# Patient Record
Sex: Female | Born: 1955 | Race: White | Hispanic: No | Smoking: Never smoker
Health system: Southern US, Community
[De-identification: ages and names within clinical notes are randomized; demographics above are authoritative.]

## PROBLEM LIST (undated history)

## (undated) DIAGNOSIS — E119 Type 2 diabetes mellitus without complications: Secondary | ICD-10-CM

## (undated) DIAGNOSIS — Z9889 Other specified postprocedural states: Secondary | ICD-10-CM

## (undated) DIAGNOSIS — F419 Anxiety disorder, unspecified: Secondary | ICD-10-CM

## (undated) DIAGNOSIS — G2581 Restless legs syndrome: Secondary | ICD-10-CM

## (undated) DIAGNOSIS — G8929 Other chronic pain: Secondary | ICD-10-CM

## (undated) DIAGNOSIS — Z87898 Personal history of other specified conditions: Secondary | ICD-10-CM

## (undated) DIAGNOSIS — R600 Localized edema: Secondary | ICD-10-CM

## (undated) DIAGNOSIS — F329 Major depressive disorder, single episode, unspecified: Secondary | ICD-10-CM

## (undated) DIAGNOSIS — E785 Hyperlipidemia, unspecified: Secondary | ICD-10-CM

## (undated) DIAGNOSIS — A4902 Methicillin resistant Staphylococcus aureus infection, unspecified site: Secondary | ICD-10-CM

## (undated) DIAGNOSIS — R112 Nausea with vomiting, unspecified: Secondary | ICD-10-CM

## (undated) DIAGNOSIS — Z973 Presence of spectacles and contact lenses: Secondary | ICD-10-CM

## (undated) DIAGNOSIS — G4730 Sleep apnea, unspecified: Secondary | ICD-10-CM

## (undated) DIAGNOSIS — I10 Essential (primary) hypertension: Secondary | ICD-10-CM

## (undated) DIAGNOSIS — G5751 Tarsal tunnel syndrome, right lower limb: Secondary | ICD-10-CM

## (undated) DIAGNOSIS — I4510 Unspecified right bundle-branch block: Secondary | ICD-10-CM

## (undated) DIAGNOSIS — E559 Vitamin D deficiency, unspecified: Secondary | ICD-10-CM

## (undated) DIAGNOSIS — E669 Obesity, unspecified: Secondary | ICD-10-CM

## (undated) DIAGNOSIS — R609 Edema, unspecified: Secondary | ICD-10-CM

## (undated) DIAGNOSIS — M722 Plantar fascial fibromatosis: Secondary | ICD-10-CM

## (undated) DIAGNOSIS — I639 Cerebral infarction, unspecified: Secondary | ICD-10-CM

## (undated) DIAGNOSIS — R0683 Snoring: Secondary | ICD-10-CM

## (undated) DIAGNOSIS — Z7901 Long term (current) use of anticoagulants: Secondary | ICD-10-CM

## (undated) DIAGNOSIS — G629 Polyneuropathy, unspecified: Secondary | ICD-10-CM

## (undated) DIAGNOSIS — E7800 Pure hypercholesterolemia, unspecified: Secondary | ICD-10-CM

## (undated) DIAGNOSIS — F32A Depression, unspecified: Secondary | ICD-10-CM

## (undated) DIAGNOSIS — R011 Cardiac murmur, unspecified: Secondary | ICD-10-CM

## (undated) DIAGNOSIS — G43909 Migraine, unspecified, not intractable, without status migrainosus: Secondary | ICD-10-CM

## (undated) HISTORY — DX: Essential (primary) hypertension: I10

## (undated) HISTORY — PX: KNEE SURGERY: SHX244

## (undated) HISTORY — DX: Cerebral infarction, unspecified: I63.9

## (undated) HISTORY — DX: Anxiety disorder, unspecified: F41.9

## (undated) HISTORY — PX: HX EYE SURGERY: 2100001143

## (undated) HISTORY — DX: Depression, unspecified: F32.A

## (undated) HISTORY — PX: HX APPENDECTOMY: SHX54

## (undated) HISTORY — DX: Methicillin resistant Staphylococcus aureus infection, unspecified site: A49.02

## (undated) HISTORY — DX: Pure hypercholesterolemia, unspecified: E78.00

## (undated) HISTORY — DX: Major depressive disorder, single episode, unspecified: F32.9

## (undated) HISTORY — DX: Migraine, unspecified, not intractable, without status migrainosus: G43.909

## (undated) HISTORY — DX: Hyperlipidemia, unspecified: E78.5

## (undated) HISTORY — PX: CYSTECTOMY: SUR359

## (undated) HISTORY — PX: REPLACEMENT TOTAL KNEE: SUR1224

---
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
## 1990-12-13 HISTORY — PX: HX TUBAL LIGATION: SHX77

## 2014-01-13 HISTORY — PX: APPENDECTOMY: SHX54

## 2014-11-13 DIAGNOSIS — Z23 Encounter for immunization: Secondary | ICD-10-CM | POA: Diagnosis not present

## 2014-11-13 DIAGNOSIS — D171 Benign lipomatous neoplasm of skin and subcutaneous tissue of trunk: Secondary | ICD-10-CM | POA: Diagnosis not present

## 2014-11-13 DIAGNOSIS — M940 Chondrocostal junction syndrome [Tietze]: Secondary | ICD-10-CM | POA: Diagnosis not present

## 2014-12-03 DIAGNOSIS — D1730 Benign lipomatous neoplasm of skin and subcutaneous tissue of unspecified sites: Secondary | ICD-10-CM | POA: Diagnosis not present

## 2014-12-23 DIAGNOSIS — D1730 Benign lipomatous neoplasm of skin and subcutaneous tissue of unspecified sites: Secondary | ICD-10-CM | POA: Diagnosis not present

## 2014-12-24 DIAGNOSIS — R9431 Abnormal electrocardiogram [ECG] [EKG]: Secondary | ICD-10-CM | POA: Diagnosis not present

## 2014-12-24 DIAGNOSIS — I4519 Other right bundle-branch block: Secondary | ICD-10-CM | POA: Diagnosis not present

## 2014-12-30 DIAGNOSIS — D1730 Benign lipomatous neoplasm of skin and subcutaneous tissue of unspecified sites: Secondary | ICD-10-CM | POA: Diagnosis not present

## 2015-05-29 DIAGNOSIS — I6930 Unspecified sequelae of cerebral infarction: Secondary | ICD-10-CM | POA: Diagnosis not present

## 2015-05-29 DIAGNOSIS — I10 Essential (primary) hypertension: Secondary | ICD-10-CM | POA: Diagnosis not present

## 2015-06-13 LAB — HM MAMMOGRAPHY: HM MAMMO: NORMAL

## 2015-06-13 LAB — HM PAP SMEAR: HM Pap smear: NORMAL

## 2015-10-09 DIAGNOSIS — Z6838 Body mass index (BMI) 38.0-38.9, adult: Secondary | ICD-10-CM | POA: Diagnosis not present

## 2015-10-09 DIAGNOSIS — G2581 Restless legs syndrome: Secondary | ICD-10-CM | POA: Diagnosis not present

## 2015-10-09 DIAGNOSIS — G5792 Unspecified mononeuropathy of left lower limb: Secondary | ICD-10-CM | POA: Diagnosis not present

## 2015-10-09 DIAGNOSIS — I10 Essential (primary) hypertension: Secondary | ICD-10-CM | POA: Diagnosis not present

## 2015-12-16 ENCOUNTER — Ambulatory Visit (INDEPENDENT_AMBULATORY_CARE_PROVIDER_SITE_OTHER): Payer: Medicare Other | Admitting: Family Medicine

## 2015-12-16 ENCOUNTER — Encounter: Payer: Self-pay | Admitting: Family Medicine

## 2015-12-16 VITALS — BP 124/84 | HR 84 | Temp 97.8°F | Resp 16 | Ht 62.5 in | Wt 215.0 lb

## 2015-12-16 DIAGNOSIS — G43009 Migraine without aura, not intractable, without status migrainosus: Secondary | ICD-10-CM | POA: Diagnosis not present

## 2015-12-16 DIAGNOSIS — I6930 Unspecified sequelae of cerebral infarction: Secondary | ICD-10-CM

## 2015-12-16 DIAGNOSIS — I10 Essential (primary) hypertension: Secondary | ICD-10-CM

## 2015-12-16 DIAGNOSIS — F329 Major depressive disorder, single episode, unspecified: Secondary | ICD-10-CM | POA: Diagnosis not present

## 2015-12-16 DIAGNOSIS — M25511 Pain in right shoulder: Secondary | ICD-10-CM | POA: Diagnosis not present

## 2015-12-16 DIAGNOSIS — E785 Hyperlipidemia, unspecified: Secondary | ICD-10-CM | POA: Diagnosis not present

## 2015-12-16 DIAGNOSIS — E559 Vitamin D deficiency, unspecified: Secondary | ICD-10-CM

## 2015-12-16 DIAGNOSIS — F32A Depression, unspecified: Secondary | ICD-10-CM

## 2015-12-16 DIAGNOSIS — Z1211 Encounter for screening for malignant neoplasm of colon: Secondary | ICD-10-CM | POA: Diagnosis not present

## 2015-12-16 MED ORDER — PREGABALIN 50 MG PO CAPS: 50.0000 mg | ORAL_CAPSULE | Freq: Two times a day (BID) | ORAL | Status: DC

## 2015-12-16 MED ORDER — SERTRALINE HCL 50 MG PO TABS: 50.0000 mg | ORAL_TABLET | Freq: Every day | ORAL | Status: DC

## 2015-12-16 MED ORDER — NORTRIPTYLINE HCL 25 MG PO CAPS: 25.0000 mg | ORAL_CAPSULE | Freq: Every day | ORAL | Status: DC

## 2015-12-16 MED ORDER — NORTRIPTYLINE HCL 10 MG PO CAPS: 10.0000 mg | ORAL_CAPSULE | Freq: Every day | ORAL | Status: DC

## 2015-12-16 NOTE — Patient Instructions (Addendum)
Depresssion: Let's switch to different medication to see if that helps your symptoms.  We are going to cross taper you onto the new medication over a period of two weeks: Nortriptyline: Take 25mg  once daily at bedtime for 7 days. Fill the 10 mg prescription and take 10 mg once daily at bedtime for 7 days. STOP around Jan 17. Zoloft/sertraline: Take 1/2 pill of 50 mg for a total of 25mg  for 1 week starting next week on January 10.  Then resume a full pill on January 17.   We will follow-up in about 3 weeks to check in on your HA and start a new medication for your HA.   Leg cramps: Take 250-400mg  of magnesium nightly for cramps.

## 2015-12-16 NOTE — Assessment & Plan Note (Signed)
Wean off TCA as it is not helping her symptoms. Cross taper TCA for SSRI over 2 week period.  Recheck efficacy of Zoloft in 4 weeks.  Check Vitamin D and TSH to see if they are exacerbating symptoms.

## 2015-12-16 NOTE — Assessment & Plan Note (Signed)
Controlled in office today. On ACE for renal protection. CMET today.

## 2015-12-16 NOTE — Assessment & Plan Note (Signed)
Continue Lyrica for residual pain. Review records from previous MD.  Aspirin and statin for secondary prevention.

## 2015-12-16 NOTE — Assessment & Plan Note (Signed)
Continue simvastatin.  Check lipid panel. 

## 2015-12-16 NOTE — Progress Notes (Signed)
Subjective:    Patient ID: Sheila Abbott, female    DOB: 1956/07/18, 60 y.o.   MRN: [id number]  HPI: Sheila Abbott is a 60 y.o. female presenting on 12/16/2015 for Establish Care   HPI  Pt presents to establish care today. Previous care provider was [facility] in [place].  It has been 2 months since Her last PCP visit. Records from previous provider will be requested and reviewed. Current medical problems include:  CVA/TIAs: Last TIA feb 2013. Takes daily aspirin and simvastatin for secondary prevention. Migraines: Taking nortriptyline to prevent HA. HA are frontal-throbbing pain pain. No visual changes. Some nausea. HA keeps her awake at night. Taking nortriptyline with little effect.  R shoulder pain: Taking lyrica for the pain, it helps. She cannot raise it up above elbow level.  Her previous PCP thought it was bursitis. No imaging, has never seen an ortho. Worked at [employer]- lots of lifting and repetitive motions. Never tried ibuprofen or aleve.  Hypertension: Taking lisinopril HCTZ. Diagnosed in 2011. Denies CP at home. No SOB. No visual changes. Daily HA- see above.  Cholesterol_ Taking simvastatin for secondary prevention. Depression: Taking nortriptyline for depression, however she is feeling down, depressed and hopeless. Especially  During holidays, sleep disturbances. No SI.  Health maintenance:  Last pap: 2016- normal. No abnormal pap. Mammogram- normal.  Colonoscopy: Never had.  TDAP- 6 years ago.  Needs flu today.    Past Medical History  Diagnosis Date  . Depression   . Hypertension   . Hyperlipidemia   . Migraine   . 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 Marital Status: Significant Other    Spouse Name: N/A  . Number of Children: N/A  . Years of Education: N/A   Occupational History  . Not on file.   Social History Main Topics  . Smoking status: Never Smoker   . Smokeless tobacco: Not on file    . Alcohol Use: No  . Drug Use: No  . Sexual Activity: Not on file   Other Topics Concern  . Not on file   Social History Narrative  . No narrative on file   Family History  Problem Relation Age of Onset  . Heart disease Mother   . Stroke Mother   . Diabetes Father   . Heart disease Father   . Stroke Father   . Diabetes Maternal Grandmother   . Heart disease Maternal Grandmother   . Stroke Maternal Grandmother   . Diabetes Maternal Grandfather   . Heart disease Maternal Grandfather   . Stroke Maternal Grandfather   . Diabetes Paternal Grandmother   . Heart disease Paternal Grandmother   . Stroke Paternal Grandmother   . Diabetes Paternal Grandfather   . Heart disease Paternal Grandfather   . Stroke Paternal Grandfather    No current outpatient prescriptions on file prior to visit.   No current facility-administered medications on file prior to visit.    Review of Systems  Constitutional: Negative for fever and chills.  HENT: Negative.   Respiratory: Negative for cough, chest tightness and wheezing.   Cardiovascular: Negative for chest pain and leg swelling.  Gastrointestinal: Negative for nausea, vomiting, abdominal pain, diarrhea and constipation.  Endocrine: Negative.  Negative for cold intolerance, heat intolerance, polydipsia, polyphagia and polyuria.  Genitourinary: Negative for dysuria and difficulty urinating.  Musculoskeletal: Positive for myalgias (muscle cramps in her legs) and arthralgias.  Neurological:  Negative for dizziness, light-headedness and numbness.  Psychiatric/Behavioral: Positive for sleep disturbance, dysphoric mood and decreased concentration. Negative for suicidal ideas.   Per HPI unless specifically indicated above     Objective:    BP 124/84 mmHg  Pulse 84  Temp(Src) 97.8 F (36.6 C) (Oral)  Resp 16  Ht 5' 2.5" (1.588 m)  Wt 215 lb (97.523 kg)  BMI 38.67 kg/m2  LMP 12/13/2010  Wt Readings from Last 3 Encounters:  12/16/15 215  lb (97.523 kg)    Physical Exam  Constitutional: She is oriented to person, place, and time. She appears well-developed and well-nourished.  HENT:  Head: Normocephalic and atraumatic.  Neck: Neck supple.  Cardiovascular: Normal rate, regular rhythm and normal heart sounds.  Exam reveals no gallop and no friction rub.   No murmur heard. Pulmonary/Chest: Effort normal and breath sounds normal. She has no wheezes. She exhibits no tenderness.  Abdominal: Soft. Normal appearance and bowel sounds are normal. She exhibits no distension and no mass. There is no tenderness. There is no rebound and no guarding.  Musculoskeletal: She exhibits no edema.       Right shoulder: She exhibits decreased range of motion (decrease abduction and adduction. ) and tenderness (AC joint.). She exhibits no bony tenderness and no swelling.  Lymphadenopathy:    She has no cervical adenopathy.  Neurological: She is alert and oriented to person, place, and time. She has normal strength and normal reflexes. No cranial nerve deficit. She displays a negative Romberg sign.  Skin: Skin is warm and dry.  Psychiatric: She has a normal mood and affect. Her speech is normal and behavior is normal. Judgment and thought content normal. Cognition and memory are normal.   Results for orders placed or performed in visit on 12/16/15  HM MAMMOGRAPHY  Result Value Ref Range   HM Mammogram normal   HM PAP SMEAR  Result Value Ref Range   HM Pap smear normal       Assessment & Plan:   Problem List Items Addressed This Visit      Cardiovascular and Mediastinum   Hypertension    Controlled in office today. On ACE for renal protection. CMET today.       Relevant Medications   aspirin 325 MG EC tablet   amLODipine (NORVASC) 5 MG tablet   simvastatin (ZOCOR) 20 MG tablet   lisinopril-hydrochlorothiazide (PRINZIDE,ZESTORETIC) 20-12.5 MG tablet   Other Relevant Orders   Comprehensive Metabolic Panel (CMET)   Migraine headache  without aura    Nortriptyline does not appear to be helping HA or depression. Wean off. When fully on new medication- try betablocker or CCB for migraine prevention.  Sparing use of NSAIDs for migraine relief.  RTC 4 weeks.       Relevant Medications   aspirin 325 MG EC tablet   amLODipine (NORVASC) 5 MG tablet   simvastatin (ZOCOR) 20 MG tablet   lisinopril-hydrochlorothiazide (PRINZIDE,ZESTORETIC) 20-12.5 MG tablet   nortriptyline (PAMELOR) 25 MG capsule   nortriptyline (PAMELOR) 10 MG capsule   sertraline (ZOLOFT) 50 MG tablet   pregabalin (LYRICA) 50 MG capsule     Other   Personal history of stroke with residual effects - Primary    Continue Lyrica for residual pain. Review records from previous MD.  Aspirin and statin for secondary prevention.       Relevant Medications   pregabalin (LYRICA) 50 MG capsule   Other Relevant Orders   Lipid Profile   Hyperlipidemia  Continue simvastatin. Check lipid panel.       Relevant Medications   aspirin 325 MG EC tablet   amLODipine (NORVASC) 5 MG tablet   simvastatin (ZOCOR) 20 MG tablet   lisinopril-hydrochlorothiazide (PRINZIDE,ZESTORETIC) 20-12.5 MG tablet   Other Relevant Orders   Lipid Profile   Depression    Wean off TCA as it is not helping her symptoms. Cross taper TCA for SSRI over 2 week period.  Recheck efficacy of Zoloft in 4 weeks.  Check Vitamin D and TSH to see if they are exacerbating symptoms.       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 Pt has history of repeitive movements. Previous provider never had her seen by orthopedist. Refer to ortho for evaluation and imaging. She is taking lyrica for pain. Consider tapering off if shoulder pain can be resolved. Sparing use of NSAIDs for pain given her CVA history.         Relevant Orders   Ambulatory referral to Orthopedic Surgery    Other Visit Diagnoses    Vitamin D deficiency        Relevant Orders    VITAMIN D 25 Hydroxy (Vit-D Deficiency, Fractures)    Screening for colon cancer        Relevant Orders    Cologuard       Meds ordered this encounter  Medications  . aspirin 325 MG EC tablet    Sig: Take 325 mg by mouth daily.  Marland Kitchen DISCONTD: pregabalin (LYRICA) 50 MG capsule    Sig: Take 50 mg by mouth 2 (two) times daily.  Marland Kitchen amLODipine (NORVASC) 5 MG tablet    Sig: Take 5 mg by mouth at bedtime.  . simvastatin (ZOCOR) 20 MG tablet    Sig: Take 20 mg by mouth at bedtime.  Marland Kitchen lisinopril-hydrochlorothiazide (PRINZIDE,ZESTORETIC) 20-12.5 MG tablet    Sig: Take 2 tablets by mouth daily.  Marland Kitchen DISCONTD: nortriptyline (PAMELOR) 50 MG capsule    Sig: Take 50 mg by mouth at bedtime.  . nortriptyline (PAMELOR) 25 MG capsule    Sig: Take 1 capsule (25 mg total) by mouth at bedtime.    Dispense:  7 capsule    Refill:  0    Order Specific Question:  Supervising Provider    Answer:  Arlis Porta [id number]  . nortriptyline (PAMELOR) 10 MG capsule    Sig: Take 1 capsule (10 mg total) by mouth at bedtime.    Dispense:  7 capsule    Refill:  0    Order Specific Question:  Supervising Provider    Answer:  Arlis Porta [id number]  . sertraline (ZOLOFT) 50 MG tablet    Sig: Take 1 tablet (50 mg total) by mouth daily.    Dispense:  30 tablet    Refill:  3    Order Specific Question:  Supervising Provider    Answer:  Arlis Porta [phone]  . pregabalin (LYRICA) 50 MG capsule    Sig: Take 1 capsule (50 mg total) by mouth 2 (two) times daily.    Dispense:  30 capsule    Refill:  1    Order Specific Question:  Supervising Provider    Answer:  Arlis Porta [phone]  Follow up plan: Return in about 4 weeks (around 01/13/2016) for HA and depression. Marland Kitchen

## 2015-12-16 NOTE — Assessment & Plan Note (Signed)
Nortriptyline does not appear to be helping HA or depression. Wean off. When fully on new medication- try betablocker or CCB for migraine prevention.  Sparing use of NSAIDs for migraine relief.  RTC 4 weeks.

## 2015-12-16 NOTE — Assessment & Plan Note (Signed)
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 Pt has history of repeitive movements. Previous provider never had her seen by orthopedist. Refer to ortho for evaluation and imaging. She is taking lyrica for pain. Consider tapering off if shoulder pain can be resolved. Sparing use of NSAIDs for pain given her CVA history.

## 2015-12-18 DIAGNOSIS — E785 Hyperlipidemia, unspecified: Secondary | ICD-10-CM | POA: Diagnosis not present

## 2015-12-18 DIAGNOSIS — I10 Essential (primary) hypertension: Secondary | ICD-10-CM | POA: Diagnosis not present

## 2015-12-18 DIAGNOSIS — I6930 Unspecified sequelae of cerebral infarction: Secondary | ICD-10-CM | POA: Diagnosis not present

## 2015-12-18 DIAGNOSIS — F329 Major depressive disorder, single episode, unspecified: Secondary | ICD-10-CM | POA: Diagnosis not present

## 2015-12-18 DIAGNOSIS — E559 Vitamin D deficiency, unspecified: Secondary | ICD-10-CM | POA: Diagnosis not present

## 2015-12-19 ENCOUNTER — Other Ambulatory Visit: Payer: Self-pay | Admitting: Family Medicine

## 2015-12-19 DIAGNOSIS — E559 Vitamin D deficiency, unspecified: Secondary | ICD-10-CM

## 2015-12-19 LAB — COMPREHENSIVE METABOLIC PANEL
A/G RATIO: 1.6 (ref 1.1–2.5)
ALBUMIN: 4.4 g/dL (ref 3.6–4.8)
ALK PHOS: 98 IU/L (ref 39–117)
ALT: 18 IU/L (ref 0–32)
AST: 16 IU/L (ref 0–40)
BUN / CREAT RATIO: 21 (ref 11–26)
BUN: 17 mg/dL (ref 8–27)
Bilirubin Total: 0.3 mg/dL (ref 0.0–1.2)
CO2: 22 mmol/L (ref 18–29)
CREATININE: 0.82 mg/dL (ref 0.57–1.00)
Calcium: 9.6 mg/dL (ref 8.7–10.3)
Chloride: 100 mmol/L (ref 96–106)
GFR calc Af Amer: 90 mL/min/{1.73_m2} (ref >59)
GFR calc non Af Amer: 78 mL/min/{1.73_m2} (ref >59)
GLOBULIN, TOTAL: 2.7 g/dL (ref 1.5–4.5)
Glucose: 104 mg/dL — ABNORMAL HIGH (ref 65–99)
Potassium: 4.1 mmol/L (ref 3.5–5.2)
Sodium: 139 mmol/L (ref 134–144)
Total Protein: 7.1 g/dL (ref 6.0–8.5)

## 2015-12-19 LAB — LIPID PANEL
CHOLESTEROL TOTAL: 155 mg/dL (ref 100–199)
Chol/HDL Ratio: 2.2 ratio units (ref 0.0–4.4)
HDL: 69 mg/dL (ref >39)
LDL CALC: 68 mg/dL (ref 0–99)
TRIGLYCERIDES: 89 mg/dL (ref 0–149)
VLDL CHOLESTEROL CAL: 18 mg/dL (ref 5–40)

## 2015-12-19 LAB — TSH: TSH: 2.43 u[IU]/mL (ref 0.450–4.500)

## 2015-12-19 LAB — VITAMIN D 25 HYDROXY (VIT D DEFICIENCY, FRACTURES): VIT D 25 HYDROXY: 16.3 ng/mL — AB (ref 30.0–100.0)

## 2015-12-19 MED ORDER — ERGOCALCIFEROL 1.25 MG (50000 UT) PO CAPS: 50000.0000 [IU] | ORAL_CAPSULE | ORAL | Status: AC

## 2015-12-25 DIAGNOSIS — Z1211 Encounter for screening for malignant neoplasm of colon: Secondary | ICD-10-CM | POA: Diagnosis not present

## 2015-12-25 DIAGNOSIS — Z1212 Encounter for screening for malignant neoplasm of rectum: Secondary | ICD-10-CM | POA: Diagnosis not present

## 2016-01-07 LAB — COLOGUARD: COLOGUARD: NEGATIVE

## 2016-01-08 ENCOUNTER — Encounter: Payer: Self-pay | Admitting: Family Medicine

## 2016-01-08 ENCOUNTER — Telehealth: Payer: Self-pay | Admitting: Family Medicine

## 2016-01-08 NOTE — Telephone Encounter (Signed)
Called pt to let her know Cologuard was negative. She can screen again in 3 years.

## 2016-01-13 ENCOUNTER — Ambulatory Visit (INDEPENDENT_AMBULATORY_CARE_PROVIDER_SITE_OTHER): Payer: Medicare Other | Admitting: Family Medicine

## 2016-01-13 VITALS — BP 104/68 | HR 71 | Temp 97.9°F | Resp 16 | Ht 62.5 in | Wt 217.0 lb

## 2016-01-13 DIAGNOSIS — R7301 Impaired fasting glucose: Secondary | ICD-10-CM

## 2016-01-13 DIAGNOSIS — F329 Major depressive disorder, single episode, unspecified: Secondary | ICD-10-CM | POA: Diagnosis not present

## 2016-01-13 DIAGNOSIS — I6930 Unspecified sequelae of cerebral infarction: Secondary | ICD-10-CM | POA: Diagnosis not present

## 2016-01-13 DIAGNOSIS — I10 Essential (primary) hypertension: Secondary | ICD-10-CM

## 2016-01-13 DIAGNOSIS — F32A Depression, unspecified: Secondary | ICD-10-CM

## 2016-01-13 LAB — POCT GLYCOSYLATED HEMOGLOBIN (HGB A1C): Hemoglobin A1C: 5

## 2016-01-13 MED ORDER — SERTRALINE HCL 100 MG PO TABS: 100.0000 mg | ORAL_TABLET | Freq: Every day | ORAL | Status: DC

## 2016-01-13 MED ORDER — GABAPENTIN 300 MG PO CAPS: ORAL_CAPSULE | ORAL | Status: DC

## 2016-01-13 NOTE — Assessment & Plan Note (Signed)
Trial of gabapentin for nerve pain. 300mg  up to TID.  Recheck 4 weeks for pain.  Consider muscle relaxant at night to help with pain and muscle cramping.   Continue aspirin and atorvastatin for secondary prevention.

## 2016-01-13 NOTE — Patient Instructions (Signed)
Leg pain: Let's try gabapentin to help with your symptoms Week 1: Take 1 capsule at bedtime. Week 2: Take 1 capsule in the AM and 1 at bedtime. Week 3: 1 capsule AM, 1 in the afternoon, 1 at bedtime OR 1 AM and 2 bedtime.    We will increase your zoloft to 100mg  daily. Take 2 tablets of your current bottle and then pick up new prescription.  BP: Take only 1 Lisinopril-HCTZ tablet daily. Goal BP >100/60 or <140/90

## 2016-01-13 NOTE — Progress Notes (Signed)
Subjective:    Patient ID: Sheila Abbott, female    DOB: 12-30-1955, 60 y.o.   MRN: [id number]  HPI: Sheila Abbott is a 60 y.o. female presenting on 01/13/2016 for Depression   HPI  Pt presents for depression follow-up.  Changed from nortriptyline to Zoloft at last visit. Overall feels some improvement with sertraline but is not sleeping well. Sleep- wakes up every few hours. Lays there trying to fall back to sleep. Pain in legs keeps her awake- "feels like a toothache" in her legs. Thought to be residual from stroke. Pain is constant throughout the day. 5-6/10. But feels better moving around. No creepy crawly sensation. Stopped lyrica because it doesn't help.   BP has been low in the AM- feeling dizzy with position changes. Checking at home. Getting similar numbers to reading today. Taking 2 lisinopril HCTZ combo pills and amlodipine each AM.   Past Medical History  Diagnosis Date  . Depression   . Hypertension   . Hyperlipidemia   . Migraine   . 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 amLODipine (NORVASC) 5 MG tablet Take 5 mg by mouth at bedtime.  Marland Kitchen aspirin 325 MG EC tablet Take 325 mg by mouth daily.  . ergocalciferol (VITAMIN D2) 50000 units capsule Take 1 capsule (50,000 Units total) by mouth once a week.  Marland Kitchen lisinopril-hydrochlorothiazide (PRINZIDE,ZESTORETIC) 20-12.5 MG tablet Take 1 tablet by mouth daily.  . simvastatin (ZOCOR) 20 MG tablet Take 20 mg by mouth at bedtime.   No current facility-administered medications on file prior to visit.    Review of Systems  Constitutional: Negative for fever and chills.  HENT: Negative.   Respiratory: Negative for cough, chest tightness and wheezing.   Cardiovascular: Negative for chest pain and leg swelling.  Gastrointestinal: Negative for nausea, vomiting, abdominal pain, diarrhea and constipation.  Endocrine: Negative.  Negative for cold intolerance, heat  intolerance, polydipsia, polyphagia and polyuria.  Genitourinary: Negative for dysuria and difficulty urinating.  Musculoskeletal: Positive for myalgias (bilateral legs R>L  burning pain at night.).  Neurological: Negative for dizziness, light-headedness and numbness.  Psychiatric/Behavioral: Positive for dysphoric mood.   Per HPI unless specifically indicated above     Objective:    BP 104/68 mmHg  Pulse 71  Temp(Src) 97.9 F (36.6 C) (Oral)  Resp 16  Ht 5' 2.5" (1.588 m)  Wt 217 lb (98.431 kg)  BMI 39.03 kg/m2  LMP 12/13/2010  Wt Readings from Last 3 Encounters:  01/13/16 217 lb (98.431 kg)  12/16/15 215 lb (97.523 kg)    Physical Exam  Constitutional: She is oriented to person, place, and time. She appears well-developed and well-nourished.  HENT:  Head: Normocephalic and atraumatic.  Neck: Neck supple.  Cardiovascular: Normal rate, regular rhythm and normal heart sounds.  Exam reveals no gallop and no friction rub.   No murmur heard. Pulmonary/Chest: Effort normal and breath sounds normal. She has no wheezes. She exhibits no tenderness.  Abdominal: Soft. Normal appearance and bowel sounds are normal. She exhibits no distension and no mass. There is no tenderness. There is no rebound and no guarding.  Musculoskeletal: Normal range of motion. She exhibits no edema or tenderness.  Lymphadenopathy:    She has no cervical adenopathy.  Neurological: She is alert and oriented to person, place, and time.  Skin: Skin is warm and dry.  Psychiatric: She has a normal mood and affect.  Her speech is normal and behavior is normal. Judgment and thought content normal. Cognition and memory are normal.     Depression screen [facility] 2/9 01/13/2016 12/16/2015  Decreased Interest 1 1  Down, Depressed, Hopeless 2 1  PHQ - 2 Score 3 2  Altered sleeping 3 2  Tired, decreased energy 3 2  Change in appetite 0 0  Feeling bad or failure about yourself  0 1  Trouble concentrating 0 1  Moving slowly  or fidgety/restless 0 0  Suicidal thoughts 0 0  PHQ-9 Score 9 8  Difficult doing work/chores Not difficult at all Somewhat difficult    Results for orders placed or performed in visit on 01/08/16  Cologuard  Result Value Ref Range   Cologuard Negative       Assessment & Plan:   Problem List Items Addressed This Visit      Cardiovascular and Mediastinum   Hypertension     Other   Personal history of stroke with residual effects    Trial of gabapentin for nerve pain. 300mg  up to TID.  Recheck 4 weeks for pain.  Consider muscle relaxant at night to help with pain and muscle cramping.   Continue aspirin and atorvastatin for secondary prevention.       Relevant Medications   gabapentin (NEURONTIN) 300 MG capsule   Depression - Primary    Doing well on Zoloft. Would like to increase to 100mg  to see if it improves her symptoms. Major issue is leg pain residual from stroke.  Increase dose. Recheck 4 weeks.       Relevant Medications   sertraline (ZOLOFT) 100 MG tablet    Other Visit Diagnoses    Elevated fasting glucose        Check HgA1c- WNL.     Relevant Orders    POCT HgB A1C       Meds ordered this encounter  Medications  . gabapentin (NEURONTIN) 300 MG capsule    Sig: Take 1 capsule at bedtime for one week. Increase to 1 capsule in the AM and 1 bedtime for 1 week. Increase to 1 AM, 1 PM, 1 bedtime daily.    Dispense:  90 capsule    Refill:  3    Order Specific Question:  Supervising Provider    Answer:  Arlis Porta [phone]  . sertraline (ZOLOFT) 100 MG tablet    Sig: Take 1 tablet (100 mg total) by mouth daily.    Dispense:  30 tablet    Refill:  11    Order Specific Question:  Supervising Provider    Answer:  Arlis Porta [phone]      Follow up plan: Return in about 4 weeks (around 02/10/2016) for BP check, depression.Marland Kitchen

## 2016-01-13 NOTE — Assessment & Plan Note (Signed)
Doing well on Zoloft. Would like to increase to 100mg  to see if it improves her symptoms. Major issue is leg pain residual from stroke.  Increase dose. Recheck 4 weeks.

## 2016-02-12 ENCOUNTER — Encounter: Payer: Self-pay | Admitting: Family Medicine

## 2016-02-12 ENCOUNTER — Ambulatory Visit (INDEPENDENT_AMBULATORY_CARE_PROVIDER_SITE_OTHER): Payer: Medicare Other | Admitting: Family Medicine

## 2016-02-12 VITALS — BP 110/76 | HR 72 | Temp 98.1°F | Resp 16 | Ht 62.5 in | Wt 217.0 lb

## 2016-02-12 DIAGNOSIS — M792 Neuralgia and neuritis, unspecified: Secondary | ICD-10-CM | POA: Diagnosis not present

## 2016-02-12 DIAGNOSIS — I10 Essential (primary) hypertension: Secondary | ICD-10-CM | POA: Diagnosis not present

## 2016-02-12 DIAGNOSIS — I6930 Unspecified sequelae of cerebral infarction: Secondary | ICD-10-CM

## 2016-02-12 DIAGNOSIS — J069 Acute upper respiratory infection, unspecified: Secondary | ICD-10-CM

## 2016-02-12 MED ORDER — GABAPENTIN 300 MG PO CAPS: ORAL_CAPSULE | ORAL | Status: DC

## 2016-02-12 MED ORDER — OXYMETAZOLINE HCL 0.05 % NA SOLN: 1.0000 | Freq: Two times a day (BID) | NASAL | Status: DC

## 2016-02-12 MED ORDER — DM-GUAIFENESIN ER 30-600 MG PO TB12: 1.0000 | ORAL_TABLET | Freq: Two times a day (BID) | ORAL | Status: DC

## 2016-02-12 MED ORDER — CHLORPHEN-PE-ACETAMINOPHEN 2-5-325 & 5-325 MG PO MISC: 1.0000 | Freq: Two times a day (BID) | ORAL | Status: DC

## 2016-02-12 NOTE — Progress Notes (Signed)
Subjective:    Patient ID: Sheila Abbott, female    DOB: 03-24-1956, 61 y.o.   MRN: [id number]  HPI: Sheila Abbott is a 60 y.o. female presenting on 02/12/2016 for Depression   HPI   Here for follow up on pain and depression.  Pain - gabapentin is working for right leg pain, but she still has some pain. Pain is a 6/10 today, improved from 10/10. Feels like the pain is changing and the pain is "in the bone". The pain is worse at night. Most of the time, she can walk around with no problem, but at times, has pain that radiates up to her hip. She says that she can walk off the pain, but it lasts up to hour. Not taking any other medications. Just joined "[employer]", which will give her a free gym membership. She plans on going 3-4 times per week. She is interested in increasing gabapentin.  Depression - Sleeping better. Appetite is the same, which is good. Enjoying things that give her pleasure. Not crying over little things anymore. Takes it at night. Happy with her dose.  BP - 110/76. No HA's normally, but woke up with one this morning r/t sinus she thinks. No dizziness. No vision changes. No CP/SOB.  Also has a sore throat and congestion that she woke up with this morning. No trouble breathing. No ear pain. Mild non-productive cough.   Past Medical History  Diagnosis Date  . Depression   . Hypertension   . Hyperlipidemia   . Migraine   . 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 amLODipine (NORVASC) 5 MG tablet Take 5 mg by mouth at bedtime.  Marland Kitchen aspirin 325 MG EC tablet Take 325 mg by mouth daily.  . ergocalciferol (VITAMIN D2) 50000 units capsule Take 1 capsule (50,000 Units total) by mouth once a week.  Marland Kitchen lisinopril-hydrochlorothiazide (PRINZIDE,ZESTORETIC) 20-12.5 MG tablet Take 1 tablet by mouth daily.  . sertraline (ZOLOFT) 100 MG tablet Take 1 tablet (100 mg total) by mouth daily.  . simvastatin  (ZOCOR) 20 MG tablet Take 20 mg by mouth at bedtime.   No current facility-administered medications on file prior to visit.    Review of Systems  Constitutional: Negative for fever, activity change, appetite change and fatigue.  HENT: Positive for congestion and sore throat. Negative for ear discharge, hearing loss, postnasal drip, rhinorrhea and sinus pressure.   Eyes: Negative for photophobia, pain and visual disturbance.  Respiratory: Negative for cough, chest tightness and shortness of breath.   Cardiovascular: Negative for chest pain and palpitations.  Gastrointestinal: Negative for nausea, vomiting, abdominal pain and diarrhea.  Genitourinary: Negative for dysuria, urgency, frequency and difficulty urinating.  Musculoskeletal: Positive for arthralgias. Negative for back pain, joint swelling and gait problem.  Skin: Negative for color change.  Neurological: Positive for tremors (chronic, RUE r/t CVA). Negative for dizziness, speech difficulty, weakness and headaches.  Psychiatric/Behavioral: Negative for suicidal ideas, behavioral problems, sleep disturbance, decreased concentration and agitation. The patient is not nervous/anxious.    Per HPI unless specifically indicated above     Objective:    BP 110/76 mmHg  Pulse 72  Temp(Src) 98.1 F (36.7 C) (Oral)  Resp 16  Ht 5' 2.5" (1.588 m)  Wt 217 lb (98.431 kg)  BMI 39.03 kg/m2  Wt Readings from Last 3 Encounters:  02/12/16 217 lb (98.431 kg)  01/13/16 217 lb (98.431  kg)  12/16/15 215 lb (97.523 kg)    Physical Exam  Constitutional: She is oriented to person, place, and time. She appears well-developed and well-nourished.  HENT:  Head: Normocephalic.  Right Ear: No drainage, swelling or tenderness. No foreign bodies. No middle ear effusion.  Left Ear: No drainage, swelling or tenderness. No foreign bodies.  No middle ear effusion.  Nose: Nose normal.  Mouth/Throat: Uvula is midline, oropharynx is clear and moist and  mucous membranes are normal. No oropharyngeal exudate, posterior oropharyngeal edema or posterior oropharyngeal erythema.  Neck: Normal range of motion.  Cardiovascular: Normal rate, regular rhythm and normal heart sounds.   Pulses:      Radial pulses are 1+ on the right side, and 1+ on the left side.       Dorsalis pedis pulses are 2+ on the right side, and 2+ on the left side.  Pulmonary/Chest: Effort normal and breath sounds normal. No respiratory distress.  Musculoskeletal: Normal range of motion.  Neurological: She is alert and oriented to person, place, and time.  Skin: Skin is warm and dry.  Psychiatric: She has a normal mood and affect. Her behavior is normal. Judgment and thought content normal.   Results for orders placed or performed in visit on 01/13/16  POCT HgB A1C  Result Value Ref Range   Hemoglobin A1C 5.0       Assessment & Plan:   Problem List Items Addressed This Visit      Cardiovascular and Mediastinum   Hypertension    BP remains 110/70's- however pt is not dizzy.  Will continue same regimen. Encouraged pt to check BP at home. If < 100/60 please call the office. Recheck 3 mos.         Other   Personal history of stroke with residual effects - Primary    Muscle cramping at night is improved with gabapentin. Aspirin and Statin for secondary prevention. Good BP control.       Relevant Medications   gabapentin (NEURONTIN) 300 MG capsule   Neuropathic pain    Good effect with Gabapentin. Increase to 1 Am, 1 PM, 2 bedtime.  Encouraged continue movement and exercise of the affected side. Recheck 3 mos.        Other Visit Diagnoses    Upper respiratory infection        Supportive care at home. Likely viral etiology. Mucinex DM. Encouraged Afrin for congestion considering HTN but sparing phenylephrine is okay. Alarm symptoms reviewed.     Relevant Medications    dextromethorphan-guaiFENesin (MUCINEX DM) 30-600 MG 12hr tablet    Chlorphen-Phenyleph-APAP  (TYLENOL SINUS CONGESTION/PAIN) 2-5-325 & 5-325 MG MISC    oxymetazoline (AFRIN NASAL SPRAY) 0.05 % nasal spray       Meds ordered this encounter  Medications  . gabapentin (NEURONTIN) 300 MG capsule    Sig: Take 1 capsule AM, 1 capsule PM, 2 caps at bedtime x 1 week. May increase to 2 AM, 1 PM, 2 at bedtime if needed.    Dispense:  150 capsule    Refill:  11    Order Specific Question:  Supervising Provider    Answer:  Arlis Porta [phone]  . dextromethorphan-guaiFENesin (MUCINEX DM) 30-600 MG 12hr tablet    Sig: Take 1 tablet by mouth 2 (two) times daily.    Dispense:  20 tablet    Refill:  0    Order Specific Question:  Supervising Provider    Answer:  Arlis Porta [  [id number]  . Chlorphen-Phenyleph-APAP (TYLENOL SINUS CONGESTION/PAIN) 2-5-325 & 5-325 MG MISC    Sig: Take 1 tablet by mouth 2 (two) times daily.    Refill:  0    Order Specific Question:  Supervising Provider    Answer:  Arlis Porta [id number]  . oxymetazoline (AFRIN NASAL SPRAY) 0.05 % nasal spray    Sig: Place 1 spray into both nostrils 2 (two) times daily. Use for 3 days and 3 days only.    Dispense:  30 mL    Refill:  0    Order Specific Question:  Supervising Provider    Answer:  Arlis Porta [id number]      Follow up plan: Return in about 3 months (around 05/14/2016).

## 2016-02-12 NOTE — Assessment & Plan Note (Signed)
BP remains 110/70's- however pt is not dizzy.  Will continue same regimen. Encouraged pt to check BP at home. If < 100/60 please call the office. Recheck 3 mos.

## 2016-02-12 NOTE — Assessment & Plan Note (Signed)
Muscle cramping at night is improved with gabapentin. Aspirin and Statin for secondary prevention. Good BP control.

## 2016-02-12 NOTE — Assessment & Plan Note (Signed)
Good effect with Gabapentin. Increase to 1 Am, 1 PM, 2 bedtime.  Encouraged continue movement and exercise of the affected side. Recheck 3 mos.

## 2016-02-12 NOTE — Patient Instructions (Addendum)
[facility] [id number] - Call for Appointments  Pain: Gabapentin- Start taking 1 Am, 1 Lunch, 2 Bedtime this week.  Can increase to 2 AM, 1 lunch, 2 Bedtime or 2AM and 3 bedtime after 1 week- depending on how your pain feels. Don't take more than 5 per day.   You can use supportive care at home to help with your symptoms. I have sent Mucinex DM to your pharmacy to help break up the congestion and soothe your cough. Honey is a natural cough suppressant- so add it to your tea in the morning.  If you have a humidifer, set that up in your bedroom at night.  You can use Tylenol Sinus OTC sparingly for congestion symptoms. I would stick mainly with Afrin decongestant spray as needed.  Please seek immediate medical attention if you develop shortness of breath not relieve by inhaler, chest pain/tightness, fever > 103 F or other concerning symptoms.

## 2016-02-25 DIAGNOSIS — M7740 Metatarsalgia, unspecified foot: Secondary | ICD-10-CM | POA: Diagnosis not present

## 2016-03-02 ENCOUNTER — Other Ambulatory Visit: Payer: Self-pay | Admitting: Family Medicine

## 2016-03-02 DIAGNOSIS — I10 Essential (primary) hypertension: Secondary | ICD-10-CM

## 2016-03-02 DIAGNOSIS — E785 Hyperlipidemia, unspecified: Secondary | ICD-10-CM

## 2016-03-02 MED ORDER — LISINOPRIL-HYDROCHLOROTHIAZIDE 20-12.5 MG PO TABS
2.0000 | ORAL_TABLET | Freq: Every day | ORAL | Status: DC
Start: 2016-03-02 — End: 2017-02-14

## 2016-03-02 MED ORDER — SIMVASTATIN 20 MG PO TABS: 20.0000 mg | ORAL_TABLET | Freq: Every day | ORAL | Status: DC

## 2016-03-02 MED ORDER — AMLODIPINE BESYLATE 5 MG PO TABS: 5.0000 mg | ORAL_TABLET | Freq: Every day | ORAL | Status: DC

## 2016-03-10 DIAGNOSIS — M7581 Other shoulder lesions, right shoulder: Secondary | ICD-10-CM | POA: Diagnosis not present

## 2016-03-10 DIAGNOSIS — G8929 Other chronic pain: Secondary | ICD-10-CM | POA: Diagnosis not present

## 2016-03-10 DIAGNOSIS — M25511 Pain in right shoulder: Secondary | ICD-10-CM | POA: Diagnosis not present

## 2016-04-26 ENCOUNTER — Other Ambulatory Visit: Payer: Self-pay | Admitting: Student

## 2016-04-26 DIAGNOSIS — M7581 Other shoulder lesions, right shoulder: Secondary | ICD-10-CM

## 2016-05-17 ENCOUNTER — Ambulatory Visit (INDEPENDENT_AMBULATORY_CARE_PROVIDER_SITE_OTHER): Payer: Medicare Other | Admitting: Family Medicine

## 2016-05-17 VITALS — BP 98/65 | HR 76 | Temp 98.1°F | Resp 16 | Ht 62.0 in | Wt 217.0 lb

## 2016-05-17 DIAGNOSIS — H8111 Benign paroxysmal vertigo, right ear: Secondary | ICD-10-CM | POA: Diagnosis not present

## 2016-05-17 DIAGNOSIS — E611 Iron deficiency: Secondary | ICD-10-CM

## 2016-05-17 DIAGNOSIS — F32A Depression, unspecified: Secondary | ICD-10-CM

## 2016-05-17 DIAGNOSIS — I10 Essential (primary) hypertension: Secondary | ICD-10-CM

## 2016-05-17 DIAGNOSIS — G4719 Other hypersomnia: Secondary | ICD-10-CM

## 2016-05-17 DIAGNOSIS — I6930 Unspecified sequelae of cerebral infarction: Secondary | ICD-10-CM | POA: Diagnosis not present

## 2016-05-17 DIAGNOSIS — F329 Major depressive disorder, single episode, unspecified: Secondary | ICD-10-CM

## 2016-05-17 DIAGNOSIS — H8110 Benign paroxysmal vertigo, unspecified ear: Secondary | ICD-10-CM | POA: Insufficient documentation

## 2016-05-17 DIAGNOSIS — G2581 Restless legs syndrome: Secondary | ICD-10-CM | POA: Diagnosis not present

## 2016-05-17 DIAGNOSIS — M792 Neuralgia and neuritis, unspecified: Secondary | ICD-10-CM | POA: Diagnosis not present

## 2016-05-17 DIAGNOSIS — E559 Vitamin D deficiency, unspecified: Secondary | ICD-10-CM | POA: Diagnosis not present

## 2016-05-17 LAB — CBC WITH DIFFERENTIAL/PLATELET
BASOS ABS: 0 {cells}/uL (ref 0–200)
Basophils Relative: 0 %
EOS ABS: 240 {cells}/uL (ref 15–500)
Eosinophils Relative: 3 %
HEMATOCRIT: 39.3 % (ref 35.0–45.0)
HEMOGLOBIN: 13.6 g/dL (ref 11.7–15.5)
LYMPHS ABS: 1520 {cells}/uL (ref 850–3900)
Lymphocytes Relative: 19 %
MCH: 32.1 pg (ref 27.0–33.0)
MCHC: 34.6 g/dL (ref 32.0–36.0)
MCV: 92.7 fL (ref 80.0–100.0)
MONO ABS: 720 {cells}/uL (ref 200–950)
MPV: 12 fL (ref 7.5–12.5)
Monocytes Relative: 9 %
NEUTROS ABS: 5520 {cells}/uL (ref 1500–7800)
NEUTROS PCT: 69 %
Platelets: 181 10*3/uL (ref 140–400)
RBC: 4.24 MIL/uL (ref 3.80–5.10)
RDW: 13.8 % (ref 11.0–15.0)
WBC: 8 10*3/uL (ref 3.8–10.8)

## 2016-05-17 LAB — COMPREHENSIVE METABOLIC PANEL
ALK PHOS: 71 U/L (ref 33–130)
ALT: 13 U/L (ref 6–29)
AST: 15 U/L (ref 10–35)
Albumin: 4.2 g/dL (ref 3.6–5.1)
BUN: 15 mg/dL (ref 7–25)
CALCIUM: 9.2 mg/dL (ref 8.6–10.4)
CO2: 23 mmol/L (ref 20–31)
Chloride: 104 mmol/L (ref 98–110)
Creat: 0.67 mg/dL (ref 0.50–0.99)
GLUCOSE: 98 mg/dL (ref 65–99)
POTASSIUM: 3.7 mmol/L (ref 3.5–5.3)
Sodium: 139 mmol/L (ref 135–146)
TOTAL PROTEIN: 7 g/dL (ref 6.1–8.1)
Total Bilirubin: 0.6 mg/dL (ref 0.2–1.2)

## 2016-05-17 LAB — IRON AND TIBC
%SAT: 40 % (ref 11–50)
IRON: 124 ug/dL (ref 45–160)
TIBC: 311 ug/dL (ref 250–450)
UIBC: 187 ug/dL (ref 125–400)

## 2016-05-17 MED ORDER — ONDANSETRON HCL 4 MG PO TABS: 4.0000 mg | ORAL_TABLET | Freq: Three times a day (TID) | ORAL | Status: AC | PRN

## 2016-05-17 MED ORDER — MECLIZINE HCL 25 MG PO TABS: 25.0000 mg | ORAL_TABLET | Freq: Three times a day (TID) | ORAL | Status: AC | PRN

## 2016-05-17 MED ORDER — GABAPENTIN 300 MG PO CAPS: ORAL_CAPSULE | ORAL | Status: AC

## 2016-05-17 MED ORDER — ROPINIROLE HCL 0.25 MG PO TABS: ORAL_TABLET | ORAL | Status: DC

## 2016-05-17 NOTE — Assessment & Plan Note (Signed)
BP low today. Decrease BP meds to 1 pill per day. Recheck in 4 weeks.

## 2016-05-17 NOTE — Assessment & Plan Note (Signed)
Increase gabapentin to 3AM and 3PM to help with symptoms. Consider transition to amitriptyline if not improving. Recheck 1 mos.

## 2016-05-17 NOTE — Assessment & Plan Note (Signed)
Recheck vitamin D levels 

## 2016-05-17 NOTE — Assessment & Plan Note (Signed)
Treat for vertigo. Meclizine PRN. Check CBC. Consider ENT or vestibular rehab with symptoms do not improve. Recheck 4 weeks.

## 2016-05-17 NOTE — Patient Instructions (Signed)
Legs: Start taking requip at bedtime. 1 tablet at night-time for 1 week and then increase up to 2 if needed.  We will get a home sleep study to rule out sleep apnea.  Ice those spots 3 times daily.   Blood pressure: Just take 1 lisinopril HCTZ daily. Keep any eye on your blood pressure.   Vertigo: take meclizine up to 3 times daily as needed for dizziness. You can also take zofran for nausea as needed. If your symptoms don't get better, we will have you see an ENT.

## 2016-05-17 NOTE — Assessment & Plan Note (Signed)
Home sleep study ordered. Risks for sleep apnea: Obesity, HTN, snoring, daytime fatigue. STOP BANG score is 6/8. Neck circumference is 16.5 in. Epworth sleepiness in 2.

## 2016-05-17 NOTE — Progress Notes (Signed)
Subjective:    Patient ID: Sheila Abbott, female    DOB: [birth year], 60 y.o.   MRN: [id number]  HPI: Sheila Abbott is a 60 y.o. female presenting on 05/17/2016 for Depression   HPI  Pt presents for depression follow-up.  Doing okay but cannot sleep. Snores very loudly and her partner says her legs "run" all night. Wakes up after 2 hours. Cannot fall back to sleep if she is tired and dragging. Has to get up and walk around to get her legs to calm down. Feels sleepy all day. Sleep apnea runs in her family. Neck circumference in 16.5in.  STOP BANG score is 6/8 R leg pain: Fluid filled knots on leg along shin bone. Very painful. Leg feels heavy. No known injuries. Has leg pain from her stroke. Gabapentin isn't helping.  Dizzy spells when she turns her head in bed. SYmptoms present x  Past Medical History  Diagnosis Date  . Depression   . Hypertension   . Hyperlipidemia   . Migraine   . 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 amLODipine (NORVASC) 5 MG tablet Take 1 tablet (5 mg total) by mouth at bedtime.  Marland Kitchen aspirin 325 MG EC tablet Take 325 mg by mouth daily.  . Chlorphen-Phenyleph-APAP (TYLENOL SINUS CONGESTION/PAIN) 2-5-325 & 5-325 MG MISC Take 1 tablet by mouth 2 (two) times daily.  Marland Kitchen dextromethorphan-guaiFENesin (MUCINEX DM) 30-600 MG 12hr tablet Take 1 tablet by mouth 2 (two) times daily.  Marland Kitchen lisinopril-hydrochlorothiazide (PRINZIDE,ZESTORETIC) 20-12.5 MG tablet Take 2 tablets by mouth daily.  Marland Kitchen oxymetazoline (AFRIN NASAL SPRAY) 0.05 % nasal spray Place 1 spray into both nostrils 2 (two) times daily. Use for 3 days and 3 days only.  . sertraline (ZOLOFT) 100 MG tablet Take 1 tablet (100 mg total) by mouth daily.  . simvastatin (ZOCOR) 20 MG tablet Take 1 tablet (20 mg total) by mouth at bedtime.   No current facility-administered medications on file prior to visit.    Review of Systems  Constitutional:  Negative for fever and chills.  HENT: Negative.   Respiratory: Negative for cough, chest tightness and wheezing.   Cardiovascular: Negative for chest pain and leg swelling.  Gastrointestinal: Negative for nausea, vomiting, abdominal pain, diarrhea and constipation.  Endocrine: Negative.  Negative for cold intolerance, heat intolerance, polydipsia, polyphagia and polyuria.  Genitourinary: Negative for dysuria and difficulty urinating.  Musculoskeletal: Positive for myalgias (Leg pain and heaviness s/p stroke).  Neurological: Positive for dizziness (with turning head). Negative for light-headedness and numbness.  Psychiatric/Behavioral: Positive for sleep disturbance.   Per HPI unless specifically indicated above     Objective:    BP 98/65 mmHg  Pulse 76  Temp(Src) 98.1 F (36.7 C) (Oral)  Resp 16  Ht 5\' 2"  (1.575 m)  Wt 217 lb (98.431 kg)  BMI 39.68 kg/m2  Wt Readings from Last 3 Encounters:  05/17/16 217 lb (98.431 kg)  02/12/16 217 lb (98.431 kg)  01/13/16 217 lb (98.431 kg)    Physical Exam  Constitutional: She is oriented to person, place, and time. She appears well-developed and well-nourished.  HENT:  Head: Normocephalic and atraumatic.  Neck: Neck supple.  Cardiovascular: Normal rate, regular rhythm and normal heart sounds.  Exam reveals no gallop and no friction rub.   No murmur heard. Pulmonary/Chest: Effort normal and breath sounds normal. She has no wheezes. She exhibits no tenderness.  Abdominal: Soft.  Normal appearance and bowel sounds are normal. She exhibits no distension and no mass. There is no tenderness. There is no rebound and no guarding.  Musculoskeletal: Normal range of motion. She exhibits no edema.       Right lower leg: She exhibits tenderness. She exhibits no swelling, no edema and no deformity.  Small skin colored soft, superficial nodules on the anterior surface the RLE.   Lymphadenopathy:    She has no cervical adenopathy.  Neurological: She is  alert and oriented to person, place, and time.  Skin: Skin is warm and dry.  Psychiatric: Her speech is normal and behavior is normal. She exhibits a depressed mood. She expresses no suicidal ideation. She expresses no suicidal plans.   Results for orders placed or performed in visit on 01/13/16  POCT HgB A1C  Result Value Ref Range   Hemoglobin A1C 5.0       Assessment & Plan:   Problem List Items Addressed This Visit      Cardiovascular and Mediastinum   Hypertension    BP low today. Decrease BP meds to 1 pill per day. Recheck in 4 weeks.         Nervous and Auditory   BPPV (benign paroxysmal positional vertigo)    Treat for vertigo. Meclizine PRN. Check CBC. Consider ENT or vestibular rehab with symptoms do not improve. Recheck 4 weeks.       Relevant Medications   meclizine (ANTIVERT) 25 MG tablet   ondansetron (ZOFRAN) 4 MG tablet   Other Relevant Orders   CBC with Differential/Platelet     Other   Personal history of stroke with residual effects   Relevant Medications   gabapentin (NEURONTIN) 300 MG capsule   Depression   Relevant Orders   VITAMIN D 25 Hydroxy (Vit-D Deficiency, Fractures)   Neuropathic pain - Primary    Increase gabapentin to 3AM and 3PM to help with symptoms. Consider transition to amitriptyline if not improving. Recheck 1 mos.       Relevant Orders   Comprehensive metabolic panel   Restless leg syndrome    Check iron studies. Start requip at bedtime to help with symptoms. Recheck 1 mos.       Relevant Medications   rOPINIRole (REQUIP) 0.25 MG tablet   Other Relevant Orders   Ferritin   Home sleep test   Excessive daytime sleepiness    Home sleep study ordered. Risks for sleep apnea: Obesity, HTN, snoring, daytime fatigue. STOP BANG score is 6/8. Neck circumference is 16.5 in. Epworth sleepiness in 2.       Relevant Orders   Home sleep test   Vitamin D deficiency    Recheck vitamin D levels.       Relevant Orders   VITAMIN D 25  Hydroxy (Vit-D Deficiency, Fractures)    Other Visit Diagnoses    Iron deficiency        Relevant Orders    Ferritin    Iron Binding Cap (TIBC)       Meds ordered this encounter  Medications  . rOPINIRole (REQUIP) 0.25 MG tablet    Sig: Take 1 tablet at bedtime for 1 week and then increase up to 2 tablets at bedtime.    Dispense:  60 tablet    Refill:  11    Order Specific Question:  Supervising Provider    Answer:  Arlis Porta [phone]  . gabapentin (NEURONTIN) 300 MG capsule    Sig: Take 3 capsules in the  morning and 3 capsules at night.    Dispense:  180 capsule    Refill:  11    Order Specific Question:  Supervising Provider    Answer:  Arlis Porta [phone]  . meclizine (ANTIVERT) 25 MG tablet    Sig: Take 1 tablet (25 mg total) by mouth 3 (three) times daily as needed for dizziness.    Dispense:  30 tablet    Refill:  1    Order Specific Question:  Supervising Provider    Answer:  Arlis Porta [phone]  . ondansetron (ZOFRAN) 4 MG tablet    Sig: Take 1 tablet (4 mg total) by mouth every 8 (eight) hours as needed for nausea or vomiting.    Dispense:  20 tablet    Refill:  0    Order Specific Question:  Supervising Provider    Answer:  Arlis Porta [id number]      Follow up plan: Return in about 4 weeks (around 06/14/2016) for Restless leg.

## 2016-05-17 NOTE — Assessment & Plan Note (Signed)
Check iron studies. Start requip at bedtime to help with symptoms. Recheck 1 mos.

## 2016-05-18 ENCOUNTER — Ambulatory Visit
Admission: RE | Admit: 2016-05-18 | Discharge: 2016-05-18 | Disposition: A | Discharge status: Home / Self Care | Payer: Medicare Other | Source: Ambulatory Visit | Attending: Student | Admitting: Student

## 2016-05-18 ENCOUNTER — Other Ambulatory Visit: Payer: Self-pay | Admitting: Family Medicine

## 2016-05-18 DIAGNOSIS — M75121 Complete rotator cuff tear or rupture of right shoulder, not specified as traumatic: Secondary | ICD-10-CM | POA: Insufficient documentation

## 2016-05-18 DIAGNOSIS — M19011 Primary osteoarthritis, right shoulder: Secondary | ICD-10-CM | POA: Diagnosis not present

## 2016-05-18 DIAGNOSIS — M7581 Other shoulder lesions, right shoulder: Secondary | ICD-10-CM | POA: Diagnosis not present

## 2016-05-18 DIAGNOSIS — M25411 Effusion, right shoulder: Secondary | ICD-10-CM | POA: Insufficient documentation

## 2016-05-18 LAB — VITAMIN D 25 HYDROXY (VIT D DEFICIENCY, FRACTURES): Vit D, 25-Hydroxy: 25 ng/mL — ABNORMAL LOW (ref 30–100)

## 2016-05-18 LAB — FERRITIN: FERRITIN: 53 ng/mL (ref 20–288)

## 2016-05-18 MED ORDER — VITAMIN D (ERGOCALCIFEROL) 1.25 MG (50000 UNIT) PO CAPS: 50000.0000 [IU] | ORAL_CAPSULE | ORAL | Status: DC

## 2016-05-26 ENCOUNTER — Telehealth: Payer: Self-pay | Admitting: Family Medicine

## 2016-05-26 DIAGNOSIS — M19019 Primary osteoarthritis, unspecified shoulder: Secondary | ICD-10-CM | POA: Insufficient documentation

## 2016-05-26 DIAGNOSIS — M75122 Complete rotator cuff tear or rupture of left shoulder, not specified as traumatic: Secondary | ICD-10-CM | POA: Insufficient documentation

## 2016-05-26 NOTE — Telephone Encounter (Signed)
Pt.stopped by the office today states she will be having Surgery next week have requested that you call Dr. Mikle Bosworth then call the pt to let her know that you have called the Dr.

## 2016-05-26 NOTE — Telephone Encounter (Signed)
Called, LMTCB 

## 2016-05-26 NOTE — Telephone Encounter (Signed)
[facility] called and spoke with Asa Lente- they are asking for surgical clearance for rotator cuff repair. Pt has history of stroke. Pt will need an appt for surgical clearance. Please call pt to make an appt. Thanks! AK

## 2016-05-28 ENCOUNTER — Encounter (INDEPENDENT_AMBULATORY_CARE_PROVIDER_SITE_OTHER): Payer: Self-pay

## 2016-05-28 ENCOUNTER — Encounter: Payer: Self-pay | Admitting: Family Medicine

## 2016-05-28 ENCOUNTER — Ambulatory Visit (INDEPENDENT_AMBULATORY_CARE_PROVIDER_SITE_OTHER): Payer: Medicare Other | Admitting: Family Medicine

## 2016-05-28 VITALS — BP 110/42 | HR 69 | Temp 98.0°F | Resp 16 | Ht 62.0 in | Wt 214.0 lb

## 2016-05-28 DIAGNOSIS — Z01818 Encounter for other preprocedural examination: Secondary | ICD-10-CM | POA: Diagnosis not present

## 2016-05-28 DIAGNOSIS — M75122 Complete rotator cuff tear or rupture of left shoulder, not specified as traumatic: Secondary | ICD-10-CM

## 2016-05-28 DIAGNOSIS — I6930 Unspecified sequelae of cerebral infarction: Secondary | ICD-10-CM | POA: Diagnosis not present

## 2016-05-28 NOTE — Progress Notes (Signed)
Subjective:    Patient ID: Sheila Abbott, female    DOB: 10-07-1956, 60 y.o.   MRN: [id number]  HPI: Sheila Abbott is a 60 y.o. female presenting on 05/28/2016 for surgical clearance   HPI    Past Medical History  Diagnosis Date  . Depression   . Hypertension   . Hyperlipidemia   . Migraine   . 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 amLODipine (NORVASC) 5 MG tablet Take 1 tablet (5 mg total) by mouth at bedtime.  Marland Kitchen aspirin 325 MG EC tablet Take 325 mg by mouth daily.  . Chlorphen-Phenyleph-APAP (TYLENOL SINUS CONGESTION/PAIN) 2-5-325 & 5-325 MG MISC Take 1 tablet by mouth 2 (two) times daily.  Marland Kitchen dextromethorphan-guaiFENesin (MUCINEX DM) 30-600 MG 12hr tablet Take 1 tablet by mouth 2 (two) times daily.  Marland Kitchen gabapentin (NEURONTIN) 300 MG capsule Take 3 capsules in the morning and 3 capsules at night.  Marland Kitchen lisinopril-hydrochlorothiazide (PRINZIDE,ZESTORETIC) 20-12.5 MG tablet Take 2 tablets by mouth daily.  . meclizine (ANTIVERT) 25 MG tablet Take 1 tablet (25 mg total) by mouth 3 (three) times daily as needed for dizziness.  . ondansetron (ZOFRAN) 4 MG tablet Take 1 tablet (4 mg total) by mouth every 8 (eight) hours as needed for nausea or vomiting.  Marland Kitchen oxymetazoline (AFRIN NASAL SPRAY) 0.05 % nasal spray Place 1 spray into both nostrils 2 (two) times daily. Use for 3 days and 3 days only.  Marland Kitchen rOPINIRole (REQUIP) 0.25 MG tablet Take 1 tablet at bedtime for 1 week and then increase up to 2 tablets at bedtime.  . sertraline (ZOLOFT) 100 MG tablet Take 1 tablet (100 mg total) by mouth daily.  . simvastatin (ZOCOR) 20 MG tablet Take 1 tablet (20 mg total) by mouth at bedtime.  . Vitamin D, Ergocalciferol, (DRISDOL) 50000 units CAPS capsule Take 1 capsule (50,000 Units total) by mouth every 7 (seven) days.   No current facility-administered medications on file prior to visit.    Review of Systems Per HPI unless  specifically indicated above     Objective:    BP 110/42 mmHg  Pulse 69  Temp(Src) 98 F (36.7 C) (Oral)  Resp 16  Ht '5\' 2"'  (1.575 m)  Wt 214 lb (97.07 kg)  BMI 39.13 kg/m2  Wt Readings from Last 3 Encounters:  05/28/16 214 lb (97.07 kg)  05/17/16 217 lb (98.431 kg)  02/12/16 217 lb (98.431 kg)    Physical Exam Results for orders placed or performed in visit on 05/17/16  CBC with Differential/Platelet  Result Value Ref Range   WBC 8.0 3.8 - 10.8 K/uL   RBC 4.24 3.80 - 5.10 MIL/uL   Hemoglobin 13.6 11.7 - 15.5 g/dL   HCT 39.3 35.0 - 45.0 %   MCV 92.7 80.0 - 100.0 fL   MCH 32.1 27.0 - 33.0 pg   MCHC 34.6 32.0 - 36.0 g/dL   RDW 13.8 11.0 - 15.0 %   Platelets 181 140 - 400 K/uL   MPV 12.0 7.5 - 12.5 fL   Neutro Abs 5520 1500 - 7800 cells/uL   Lymphs Abs 1520 850 - 3900 cells/uL   Monocytes Absolute 720 200 - 950 cells/uL   Eosinophils Absolute 240 15 - 500 cells/uL   Basophils Absolute 0 0 - 200 cells/uL   Neutrophils Relative % 69 %   Lymphocytes Relative 19 %   Monocytes Relative 9 %  Eosinophils Relative 3 %   Basophils Relative 0 %   Smear Review Criteria for review not met   VITAMIN D 25 Hydroxy (Vit-D Deficiency, Fractures)  Result Value Ref Range   Vit D, 25-Hydroxy 25 (L) 30 - 100 ng/mL  Comprehensive metabolic panel  Result Value Ref Range   Sodium 139 135 - 146 mmol/L   Potassium 3.7 3.5 - 5.3 mmol/L   Chloride 104 98 - 110 mmol/L   CO2 23 20 - 31 mmol/L   Glucose, Bld 98 65 - 99 mg/dL   BUN 15 7 - 25 mg/dL   Creat 0.67 0.50 - 0.99 mg/dL   Total Bilirubin 0.6 0.2 - 1.2 mg/dL   Alkaline Phosphatase 71 33 - 130 U/L   AST 15 10 - 35 U/L   ALT 13 6 - 29 U/L   Total Protein 7.0 6.1 - 8.1 g/dL   Albumin 4.2 3.6 - 5.1 g/dL   Calcium 9.2 8.6 - 10.4 mg/dL  Ferritin  Result Value Ref Range   Ferritin 53 20 - 288 ng/mL  Iron Binding Cap (TIBC)  Result Value Ref Range   Iron 124 45 - 160 ug/dL   UIBC 187 125 - 400 ug/dL   TIBC 311 250 - 450 ug/dL    %SAT 40 11 - 50 %      Assessment & Plan:   Problem List Items Addressed This Visit    None    Visit Diagnoses    Pre-op evaluation    -  Primary    Relevant Orders    EKG 12-Lead       Meds ordered this encounter  Medications  . DISCONTD: ondansetron (ZOFRAN) 4 MG tablet    Sig: Take by mouth.      Follow up plan: Return if symptoms worsen or fail to improve, for keep appt as scheduled. Marland Kitchen

## 2016-05-28 NOTE — Assessment & Plan Note (Signed)
Planned surgery for next week.

## 2016-05-28 NOTE — Assessment & Plan Note (Signed)
LDL well controlled with statin. On aspirin for secondary prevention.

## 2016-05-28 NOTE — Patient Instructions (Signed)
I think you are safe to have surgery. Your ECG does have some non-specific changes. The safest thing to do would be to compare it to your previous from 2013. We will request your records. However, in discussion with you- you would like to proceed to surgery at this time.

## 2016-05-28 NOTE — Progress Notes (Signed)
Subjective:    Patient ID: Sheila Abbott, female    DOB: 06/27/1956, 60 y.o.   MRN: [id number]  HPI: Sheila Abbott is a 60 y.o. female presenting on 05/28/2016 for surgical clearance   HPI  Pt presents for pre-op clearance for rotator cuff repair for her R shoulder. Has a history of previous stroke in 2013. Has no history of heart problems. No history of problems with anesthesia. Does have some nausea.  ECG shows some non-specific changes.  No previous to compare. Pt reports no chest pain, no shortness of breath. Previous labs are normal.   Past Medical History  Diagnosis Date  . Depression   . Hypertension   . Hyperlipidemia   . Migraine   . Stroke (cerebrum) ([place])     2 mini    Current Outpatient Prescriptions on File Prior to Visit  Medication Sig  . amLODipine (NORVASC) 5 MG tablet Take 1 tablet (5 mg total) by mouth at bedtime.  Marland Kitchen aspirin 325 MG EC tablet Take 325 mg by mouth daily.  . Chlorphen-Phenyleph-APAP (TYLENOL SINUS CONGESTION/PAIN) 2-5-325 & 5-325 MG MISC Take 1 tablet by mouth 2 (two) times daily.  Marland Kitchen dextromethorphan-guaiFENesin (MUCINEX DM) 30-600 MG 12hr tablet Take 1 tablet by mouth 2 (two) times daily.  Marland Kitchen gabapentin (NEURONTIN) 300 MG capsule Take 3 capsules in the morning and 3 capsules at night.  Marland Kitchen lisinopril-hydrochlorothiazide (PRINZIDE,ZESTORETIC) 20-12.5 MG tablet Take 2 tablets by mouth daily.  . meclizine (ANTIVERT) 25 MG tablet Take 1 tablet (25 mg total) by mouth 3 (three) times daily as needed for dizziness.  . ondansetron (ZOFRAN) 4 MG tablet Take 1 tablet (4 mg total) by mouth every 8 (eight) hours as needed for nausea or vomiting.  Marland Kitchen oxymetazoline (AFRIN NASAL SPRAY) 0.05 % nasal spray Place 1 spray into both nostrils 2 (two) times daily. Use for 3 days and 3 days only.  Marland Kitchen rOPINIRole (REQUIP) 0.25 MG tablet Take 1 tablet at bedtime for 1 week and then increase up to 2 tablets at bedtime.  . sertraline (ZOLOFT) 100 MG tablet Take 1 tablet (100 mg  total) by mouth daily.  . simvastatin (ZOCOR) 20 MG tablet Take 1 tablet (20 mg total) by mouth at bedtime.  . Vitamin D, Ergocalciferol, (DRISDOL) 50000 units CAPS capsule Take 1 capsule (50,000 Units total) by mouth every 7 (seven) days.   No current facility-administered medications on file prior to visit.    Review of Systems  Constitutional: Negative for fever and chills.  HENT: Negative.   Respiratory: Negative for cough, chest tightness and wheezing.   Cardiovascular: Negative for chest pain and leg swelling.  Gastrointestinal: Negative for nausea, vomiting, abdominal pain, diarrhea and constipation.  Endocrine: Negative.  Negative for cold intolerance, heat intolerance, polydipsia, polyphagia and polyuria.  Genitourinary: Negative for dysuria and difficulty urinating.  Musculoskeletal: Negative.   Neurological: Negative for dizziness, light-headedness and numbness.  Psychiatric/Behavioral: Negative.    Per HPI unless specifically indicated above     Objective:    BP 110/42 mmHg  Pulse 69  Temp(Src) 98 F (36.7 C) (Oral)  Resp 16  Ht '5\' 2"'  (1.575 m)  Wt 214 lb (97.07 kg)  BMI 39.13 kg/m2  Wt Readings from Last 3 Encounters:  05/28/16 214 lb (97.07 kg)  05/17/16 217 lb (98.431 kg)  02/12/16 217 lb (98.431 kg)    Physical Exam  Constitutional: She is oriented to person, place, and time. She appears well-developed and well-nourished.  HENT:  Head: Normocephalic and  atraumatic.  Neck: Neck supple.  Cardiovascular: Normal rate, regular rhythm and normal heart sounds.  Exam reveals no gallop and no friction rub.   No murmur heard. Pulmonary/Chest: Effort normal and breath sounds normal. She has no wheezes. She exhibits no tenderness.  Abdominal: Soft. Normal appearance and bowel sounds are normal. She exhibits no distension and no mass. There is no tenderness. There is no rebound and no guarding.  Musculoskeletal: Normal range of motion. She exhibits no edema or  tenderness.  Lymphadenopathy:    She has no cervical adenopathy.  Neurological: She is alert and oriented to person, place, and time.  Skin: Skin is warm and dry.   Results for orders placed or performed in visit on 05/17/16  CBC with Differential/Platelet  Result Value Ref Range   WBC 8.0 3.8 - 10.8 K/uL   RBC 4.24 3.80 - 5.10 MIL/uL   Hemoglobin 13.6 11.7 - 15.5 g/dL   HCT 39.3 35.0 - 45.0 %   MCV 92.7 80.0 - 100.0 fL   MCH 32.1 27.0 - 33.0 pg   MCHC 34.6 32.0 - 36.0 g/dL   RDW 13.8 11.0 - 15.0 %   Platelets 181 140 - 400 K/uL   MPV 12.0 7.5 - 12.5 fL   Neutro Abs 5520 1500 - 7800 cells/uL   Lymphs Abs 1520 850 - 3900 cells/uL   Monocytes Absolute 720 200 - 950 cells/uL   Eosinophils Absolute 240 15 - 500 cells/uL   Basophils Absolute 0 0 - 200 cells/uL   Neutrophils Relative % 69 %   Lymphocytes Relative 19 %   Monocytes Relative 9 %   Eosinophils Relative 3 %   Basophils Relative 0 %   Smear Review Criteria for review not met   VITAMIN D 25 Hydroxy (Vit-D Deficiency, Fractures)  Result Value Ref Range   Vit D, 25-Hydroxy 25 (L) 30 - 100 ng/mL  Comprehensive metabolic panel  Result Value Ref Range   Sodium 139 135 - 146 mmol/L   Potassium 3.7 3.5 - 5.3 mmol/L   Chloride 104 98 - 110 mmol/L   CO2 23 20 - 31 mmol/L   Glucose, Bld 98 65 - 99 mg/dL   BUN 15 7 - 25 mg/dL   Creat 0.67 0.50 - 0.99 mg/dL   Total Bilirubin 0.6 0.2 - 1.2 mg/dL   Alkaline Phosphatase 71 33 - 130 U/L   AST 15 10 - 35 U/L   ALT 13 6 - 29 U/L   Total Protein 7.0 6.1 - 8.1 g/dL   Albumin 4.2 3.6 - 5.1 g/dL   Calcium 9.2 8.6 - 10.4 mg/dL  Ferritin  Result Value Ref Range   Ferritin 53 20 - 288 ng/mL  Iron Binding Cap (TIBC)  Result Value Ref Range   Iron 124 45 - 160 ug/dL   UIBC 187 125 - 400 ug/dL   TIBC 311 250 - 450 ug/dL   %SAT 40 11 - 50 %      Assessment & Plan:   Problem List Items Addressed This Visit      Musculoskeletal and Integument   Complete rotator cuff rupture of  left shoulder    Planned surgery for next week.         Other   Personal history of stroke with residual effects    LDL well controlled with statin. On aspirin for secondary prevention.        Other Visit Diagnoses    Pre-op evaluation    -  Primary    ECG shows non-specific T wave changes. Discussed with patient that I don't think it would be unsafe to proceed with surgery at this time- however I would like to obtain copy of previous EKGs for comparison given her previous history of stroke. Pt would like to proceed with surgery without copy of EKG at this time. Records have been requested.     Relevant Orders    EKG 12-Lead       Meds ordered this encounter  Medications  . DISCONTD: ondansetron (ZOFRAN) 4 MG tablet    Sig: Take by mouth.      Follow up plan: Return if symptoms worsen or fail to improve, for keep appt as scheduled. Marland Kitchen

## 2016-06-08 ENCOUNTER — Encounter
Admission: RE | Admit: 2016-06-08 | Discharge: 2016-06-08 | Disposition: A | Discharge status: Home / Self Care | Payer: Medicare Other | Source: Ambulatory Visit | Attending: Surgery | Admitting: Surgery

## 2016-06-08 DIAGNOSIS — Z01818 Encounter for other preprocedural examination: Secondary | ICD-10-CM | POA: Insufficient documentation

## 2016-06-08 NOTE — Pre-Procedure Instructions (Signed)
Pt reports she is being set up for a sleep study after surgery by her PCP.

## 2016-06-08 NOTE — Patient Instructions (Signed)
  Your procedure is scheduled PA:[id number] July 6 , 2017. Report to Same Day Surgery. To find out your arrival time please call [phone] between 1PM - 3PM on Wednesday June 16, 2016.  Remember: Instructions that are not followed completely may result in serious medical risk, up to and including death, or upon the discretion of your surgeon and anesthesiologist your surgery may need to be rescheduled.    _x___ 1. Do not eat food or drink liquids after midnight. No gum chewing or hard candies.     ____ 2. No Alcohol for 24 hours before or after surgery.   ____ 3. Bring all medications with you on the day of surgery if instructed.    __x__ 4. Notify your doctor if there is any change in your medical condition     (cold, fever, infections).     Do not wear jewelry, make-up, hairpins, clips or nail polish.  Do not wear lotions, powders, or perfumes.   Do not shave 48 hours prior to surgery. Men may shave face and neck.  Do not bring valuables to the hospital.    [facility] is not responsible for any belongings or valuables.               Contacts, dentures or bridgework may not be worn into surgery.  Leave your suitcase in the car. After surgery it may be brought to your room.  For patients admitted to the hospital, discharge time is determined by your treatment team.   Patients discharged the day of surgery will not be allowed to drive home.    Please read over the following fact sheets that you were given:   [facility] Preparing for Surgery  __x_ Take these medicines the morning of surgery with A SIP OF WATER:    1. gabapentin (NEURONTIN)    ____ Fleet Enema (as directed)   _x__ Use CHG Soap as directed on instruction sheet  ____ Use inhalers on the day of surgery and bring to hospital day of surgery  ____ Stop metformin 2 days prior to surgery    ____ Take 1/2 of usual insulin dose the night before surgery and none on the morning of  surgery.   _x___ Patient stopped  aspirin 2 weeks ago.  _x___ Stop Anti-inflammatories such as Advil, Aleve, Ibuprofen, Motrin, Naproxen, Naprosyn, Goodies powders or aspirin products. OK to take Tylenol for  pain.   ____ Stop supplements until after surgery.    ____ Bring C-Pap to the hospital.

## 2016-06-12 HISTORY — PX: HX ROTATOR CUFF REPAIR: SHX139

## 2016-06-12 HISTORY — PX: HX SHOULDER SURGERY: 2100001311

## 2016-06-17 ENCOUNTER — Ambulatory Visit: Payer: Medicare Other | Admitting: Anesthesiology

## 2016-06-17 ENCOUNTER — Ambulatory Visit
Admission: RE | Admit: 2016-06-17 | Discharge: 2016-06-17 | Disposition: A | Discharge status: Home / Self Care | Payer: Medicare Other | Source: Ambulatory Visit | Attending: Surgery | Admitting: Surgery

## 2016-06-17 ENCOUNTER — Encounter: Payer: Self-pay | Admitting: *Deleted

## 2016-06-17 ENCOUNTER — Encounter
Admission: RE | Disposition: A | Discharge status: Home / Self Care | Payer: Self-pay | Source: Ambulatory Visit | Attending: Surgery

## 2016-06-17 DIAGNOSIS — E785 Hyperlipidemia, unspecified: Secondary | ICD-10-CM | POA: Diagnosis not present

## 2016-06-17 DIAGNOSIS — F329 Major depressive disorder, single episode, unspecified: Secondary | ICD-10-CM | POA: Diagnosis not present

## 2016-06-17 DIAGNOSIS — G2581 Restless legs syndrome: Secondary | ICD-10-CM | POA: Diagnosis not present

## 2016-06-17 DIAGNOSIS — M7541 Impingement syndrome of right shoulder: Secondary | ICD-10-CM | POA: Insufficient documentation

## 2016-06-17 DIAGNOSIS — Z79899 Other long term (current) drug therapy: Secondary | ICD-10-CM | POA: Insufficient documentation

## 2016-06-17 DIAGNOSIS — M75101 Unspecified rotator cuff tear or rupture of right shoulder, not specified as traumatic: Secondary | ICD-10-CM | POA: Diagnosis present

## 2016-06-17 DIAGNOSIS — Z8249 Family history of ischemic heart disease and other diseases of the circulatory system: Secondary | ICD-10-CM | POA: Insufficient documentation

## 2016-06-17 DIAGNOSIS — Z840 Family history of diseases of the skin and subcutaneous tissue: Secondary | ICD-10-CM | POA: Diagnosis not present

## 2016-06-17 DIAGNOSIS — R42 Dizziness and giddiness: Secondary | ICD-10-CM | POA: Diagnosis not present

## 2016-06-17 DIAGNOSIS — I10 Essential (primary) hypertension: Secondary | ICD-10-CM | POA: Insufficient documentation

## 2016-06-17 DIAGNOSIS — Z8261 Family history of arthritis: Secondary | ICD-10-CM | POA: Insufficient documentation

## 2016-06-17 DIAGNOSIS — M75121 Complete rotator cuff tear or rupture of right shoulder, not specified as traumatic: Secondary | ICD-10-CM | POA: Insufficient documentation

## 2016-06-17 DIAGNOSIS — Z8673 Personal history of transient ischemic attack (TIA), and cerebral infarction without residual deficits: Secondary | ICD-10-CM | POA: Diagnosis not present

## 2016-06-17 DIAGNOSIS — Z823 Family history of stroke: Secondary | ICD-10-CM | POA: Insufficient documentation

## 2016-06-17 HISTORY — PX: SHOULDER ARTHROSCOPY WITH OPEN ROTATOR CUFF REPAIR: SHX6092

## 2016-06-17 SURGERY — ARTHROSCOPY, SHOULDER WITH REPAIR, ROTATOR CUFF, OPEN
Anesthesia: General | Site: Shoulder | Laterality: Right | Wound class: Clean

## 2016-06-17 MED ORDER — ONDANSETRON HCL 4 MG/2ML IJ SOLN: 4.0000 mg | Freq: Four times a day (QID) | INTRAMUSCULAR | Status: DC | PRN

## 2016-06-17 MED ORDER — METOCLOPRAMIDE HCL 10 MG PO TABS: 5.0000 mg | ORAL_TABLET | Freq: Three times a day (TID) | ORAL | Status: DC | PRN

## 2016-06-17 MED ORDER — OXYCODONE HCL 5 MG PO TABS: 5.0000 mg | ORAL_TABLET | ORAL | Status: DC | PRN

## 2016-06-17 MED ORDER — ROPIVACAINE HCL 5 MG/ML IJ SOLN
INTRAMUSCULAR | Status: DC | PRN
  Administered 2016-06-17 (×3): 10 mL via EPIDURAL

## 2016-06-17 MED ORDER — FAMOTIDINE 20 MG PO TABS
20.0000 mg | ORAL_TABLET | Freq: Once | ORAL | Status: AC
  Administered 2016-06-17: 20 mg via ORAL

## 2016-06-17 MED ORDER — SUCCINYLCHOLINE CHLORIDE 20 MG/ML IJ SOLN
INTRAMUSCULAR | Status: DC | PRN
Start: 2016-06-17 — End: 2016-06-17
  Administered 2016-06-17: 100 mg via INTRAVENOUS

## 2016-06-17 MED ORDER — POTASSIUM CHLORIDE IN NACL 20-0.9 MEQ/L-% IV SOLN
INTRAVENOUS | Status: DC
  Filled 2016-06-17: qty 1000

## 2016-06-17 MED ORDER — BUPIVACAINE-EPINEPHRINE (PF) 0.5% -1:200000 IJ SOLN
INTRAMUSCULAR | Status: AC
  Filled 2016-06-17: qty 30

## 2016-06-17 MED ORDER — METOCLOPRAMIDE HCL 5 MG/ML IJ SOLN: 5.0000 mg | Freq: Three times a day (TID) | INTRAMUSCULAR | Status: DC | PRN

## 2016-06-17 MED ORDER — FENTANYL CITRATE (PF) 100 MCG/2ML IJ SOLN
INTRAMUSCULAR | Status: DC | PRN
  Administered 2016-06-17: 100 ug via INTRAVENOUS

## 2016-06-17 MED ORDER — ONDANSETRON HCL 4 MG/2ML IJ SOLN
4.0000 mg | Freq: Once | INTRAMUSCULAR | Status: AC | PRN
  Administered 2016-06-17: 4 mg via INTRAVENOUS

## 2016-06-17 MED ORDER — ONDANSETRON HCL 4 MG PO TABS: 4.0000 mg | ORAL_TABLET | Freq: Four times a day (QID) | ORAL | Status: DC | PRN

## 2016-06-17 MED ORDER — LACTATED RINGERS IV SOLN
INTRAVENOUS | Status: DC
  Administered 2016-06-17: 11:00:00 via INTRAVENOUS

## 2016-06-17 MED ORDER — LIDOCAINE HCL (CARDIAC) 20 MG/ML IV SOLN
INTRAVENOUS | Status: DC | PRN
  Administered 2016-06-17: 30 mg via INTRAVENOUS

## 2016-06-17 MED ORDER — FAMOTIDINE 20 MG PO TABS
ORAL_TABLET | ORAL | Status: AC
  Administered 2016-06-17: 20 mg via ORAL
  Filled 2016-06-17: qty 1

## 2016-06-17 MED ORDER — ONDANSETRON HCL 4 MG/2ML IJ SOLN
INTRAMUSCULAR | Status: DC | PRN
  Administered 2016-06-17: 4 mg via INTRAVENOUS

## 2016-06-17 MED ORDER — ROCURONIUM BROMIDE 100 MG/10ML IV SOLN
INTRAVENOUS | Status: DC | PRN
  Administered 2016-06-17: 40 mg via INTRAVENOUS
  Administered 2016-06-17: 10 mg via INTRAVENOUS

## 2016-06-17 MED ORDER — EPINEPHRINE HCL 1 MG/ML IJ SOLN
INTRAMUSCULAR | Status: DC | PRN
  Administered 2016-06-17: 2 mg

## 2016-06-17 MED ORDER — LIDOCAINE HCL (PF) 2 % IJ SOLN
INTRAMUSCULAR | Status: DC | PRN
  Administered 2016-06-17: 3 mL via INTRADERMAL

## 2016-06-17 MED ORDER — MIDAZOLAM HCL 5 MG/5ML IJ SOLN
INTRAMUSCULAR | Status: AC
  Administered 2016-06-17: 2 mg
  Filled 2016-06-17: qty 5

## 2016-06-17 MED ORDER — FENTANYL CITRATE (PF) 100 MCG/2ML IJ SOLN
INTRAMUSCULAR | Status: AC
  Filled 2016-06-17: qty 2

## 2016-06-17 MED ORDER — FENTANYL CITRATE (PF) 100 MCG/2ML IJ SOLN: 25.0000 ug | INTRAMUSCULAR | Status: DC | PRN

## 2016-06-17 MED ORDER — CEFAZOLIN SODIUM-DEXTROSE 2-4 GM/100ML-% IV SOLN
INTRAVENOUS | Status: AC
  Administered 2016-06-17: 2 g via INTRAVENOUS
  Filled 2016-06-17: qty 100

## 2016-06-17 MED ORDER — BUPIVACAINE-EPINEPHRINE 0.5% -1:200000 IJ SOLN
INTRAMUSCULAR | Status: DC | PRN
  Administered 2016-06-17: 30 mL

## 2016-06-17 MED ORDER — PHENYLEPHRINE HCL 10 MG/ML IJ SOLN
INTRAMUSCULAR | Status: DC | PRN
  Administered 2016-06-17 (×2): 100 ug via INTRAVENOUS

## 2016-06-17 MED ORDER — SUGAMMADEX SODIUM 500 MG/5ML IV SOLN
INTRAVENOUS | Status: DC | PRN
  Administered 2016-06-17: 194.2 mg via INTRAVENOUS

## 2016-06-17 MED ORDER — OXYCODONE HCL 5 MG PO TABS
5.0000 mg | ORAL_TABLET | ORAL | Status: DC | PRN
  Administered 2016-06-17: 5 mg via ORAL

## 2016-06-17 MED ORDER — EPINEPHRINE HCL 1 MG/ML IJ SOLN
INTRAMUSCULAR | Status: AC
  Filled 2016-06-17: qty 2

## 2016-06-17 MED ORDER — ONDANSETRON HCL 4 MG/2ML IJ SOLN
INTRAMUSCULAR | Status: AC
  Filled 2016-06-17: qty 2

## 2016-06-17 MED ORDER — DEXAMETHASONE SODIUM PHOSPHATE 10 MG/ML IJ SOLN
INTRAMUSCULAR | Status: DC | PRN
  Administered 2016-06-17: 10 mg via INTRAVENOUS

## 2016-06-17 MED ORDER — OXYCODONE HCL 5 MG PO TABS
ORAL_TABLET | ORAL | Status: AC
  Filled 2016-06-17: qty 1

## 2016-06-17 MED ORDER — EPHEDRINE SULFATE 50 MG/ML IJ SOLN
INTRAMUSCULAR | Status: DC | PRN
Start: 2016-06-17 — End: 2016-06-17
  Administered 2016-06-17: 10 mg via INTRAVENOUS

## 2016-06-17 MED ORDER — CEFAZOLIN SODIUM-DEXTROSE 2-4 GM/100ML-% IV SOLN
2.0000 g | Freq: Once | INTRAVENOUS | Status: DC
  Filled 2016-06-17: qty 100

## 2016-06-17 SURGICAL SUPPLY — 46 items
ANCHOR JUGGERKNOT WTAP NDL 2.9 (Anchor) ×6 IMPLANT
ANCHOR SUT QUATTRO KNTLS 4.5 (Anchor) ×3 IMPLANT
BIT DRILL JUGRKNT W/NDL BIT2.9 (DRILL) ×1 IMPLANT
BLADE FULL RADIUS 3.5 (BLADE) ×3 IMPLANT
BLADE SHAVER 4.5X7 STR FR (MISCELLANEOUS) IMPLANT
BUR ACROMIONIZER 4.0 (BURR) ×3 IMPLANT
BUR BR 5.5 WIDE MOUTH (BURR) IMPLANT
CANNULA SHAVER 8MMX76MM (CANNULA) ×3 IMPLANT
CHLORAPREP W/TINT 26ML (MISCELLANEOUS) ×6 IMPLANT
COVER MAYO STAND STRL (DRAPES) ×3 IMPLANT
DRAPE IMP U-DRAPE 54X76 (DRAPES) ×6 IMPLANT
DRILL JUGGERKNOT W/NDL BIT 2.9 (DRILL) ×3
DRSG OPSITE POSTOP 4X8 (GAUZE/BANDAGES/DRESSINGS) ×3 IMPLANT
ELECT REM PT RETURN 9FT ADLT (ELECTROSURGICAL) ×3
ELECTRODE REM PT RTRN 9FT ADLT (ELECTROSURGICAL) ×1 IMPLANT
GAUZE PETRO XEROFOAM 1X8 (MISCELLANEOUS) ×3 IMPLANT
GAUZE SPONGE 4X4 12PLY STRL (GAUZE/BANDAGES/DRESSINGS) ×3 IMPLANT
GLOVE BIO SURGEON STRL SZ7.5 (GLOVE) ×9 IMPLANT
GLOVE BIO SURGEON STRL SZ8 (GLOVE) ×6 IMPLANT
GLOVE BIOGEL PI IND STRL 8 (GLOVE) ×2 IMPLANT
GLOVE BIOGEL PI INDICATOR 8 (GLOVE) ×4
GLOVE INDICATOR 8.0 STRL GRN (GLOVE) ×3 IMPLANT
GOWN STRL REUS W/ TWL LRG LVL3 (GOWN DISPOSABLE) ×3 IMPLANT
GOWN STRL REUS W/ TWL XL LVL3 (GOWN DISPOSABLE) ×1 IMPLANT
GOWN STRL REUS W/TWL LRG LVL3 (GOWN DISPOSABLE) ×6
GOWN STRL REUS W/TWL XL LVL3 (GOWN DISPOSABLE) ×2
GRASPER SUT 15 45D LOW PRO (SUTURE) ×6 IMPLANT
IV LACTATED RINGER IRRG 3000ML (IV SOLUTION) ×4
IV LR IRRIG 3000ML ARTHROMATIC (IV SOLUTION) ×2 IMPLANT
MANIFOLD NEPTUNE II (INSTRUMENTS) ×3 IMPLANT
MASK FACE SPIDER DISP (MASK) ×3 IMPLANT
MAT BLUE FLOOR 46X72 FLO (MISCELLANEOUS) ×3 IMPLANT
NEEDLE REVERSE CUT 1/2 CRC (NEEDLE) ×3 IMPLANT
PACK ARTHROSCOPY SHOULDER (MISCELLANEOUS) ×3 IMPLANT
SLING ARM LRG DEEP (SOFTGOODS) ×3 IMPLANT
SLING ULTRA II LG (MISCELLANEOUS) ×3 IMPLANT
STAPLER SKIN PROX 35W (STAPLE) ×3 IMPLANT
STRAP SAFETY BODY (MISCELLANEOUS) ×3 IMPLANT
SUT ETHIBOND 0 MO6 C/R (SUTURE) ×3 IMPLANT
SUT VIC AB 2-0 CT1 27 (SUTURE) ×4
SUT VIC AB 2-0 CT1 TAPERPNT 27 (SUTURE) ×2 IMPLANT
TAPE MICROFOAM 4IN (TAPE) ×3 IMPLANT
TUBING ARTHRO INFLOW-ONLY STRL (TUBING) ×3 IMPLANT
TUBING CONNECTING 10 (TUBING) ×2 IMPLANT
TUBING CONNECTING 10' (TUBING) ×1
WAND HAND CNTRL MULTIVAC 90 (MISCELLANEOUS) ×3 IMPLANT

## 2016-06-17 NOTE — Anesthesia Procedure Notes (Addendum)
Procedure Name: Intubation Date/Time: 06/17/2016 11:20 AM Performed by: Jonna Clark Pre-anesthesia Checklist: Patient identified, Patient being monitored, Timeout performed, Emergency Drugs available and Suction available Patient Re-evaluated:Patient Re-evaluated prior to inductionOxygen Delivery Method: Circle system utilized Preoxygenation: Pre-oxygenation with 100% oxygen Intubation Type: IV induction Ventilation: Mask ventilation without difficulty Laryngoscope Size: Miller and 2 Grade View: Grade II Tube type: Oral Tube size: 7.0 mm Number of attempts: 2 Airway Equipment and Method: Stylet Placement Confirmation: ETT inserted through vocal cords under direct vision,  positive ETCO2 and breath sounds checked- equal and bilateral Secured at: 21 cm Tube secured with: Tape Dental Injury: Teeth and Oropharynx as per pre-operative assessment    Anesthesia Regional Block:  Interscalene brachial plexus block  Pre-Anesthetic Checklist: ,, timeout performed, Correct Patient, Correct Site, Correct Laterality, Correct Procedure, Correct Position, site marked, Risks and benefits discussed,  Surgical consent,  Pre-op evaluation,  At surgeon's request and post-op pain management  Laterality: Right  Prep: chloraprep       Needles:  Injection technique: Single-shot  Needle Type: Stimiplex     Needle Length: 5cm 5 cm Needle Gauge: 22 and 22 G    Additional Needles:  Procedures: ultrasound guided (picture in chart) and nerve stimulator Interscalene brachial plexus block  Nerve Stimulator or Paresthesia:  Response: 0.4 mA,   Additional Responses:   Narrative:  Start time: 06/17/2016 10:30 AM End time: 06/17/2016 10:36 AM Injection made incrementally with aspirations every 5 mL.  Performed by: Personally  Anesthesiologist: Alvin Critchley  Additional Notes: Time out called.  Roll placed under R back .  The R neck was prepped and draped in sterile fashion.  Landmarks were  identified prior to prep.  Sterile jelly was applied to right neck. b An US probe was used to Identify the nerve bundle in the typical stop light configuration at there C6 level.  A skin wheal was made lateral to the probe with 1% Lidocaine plain.  A 22 G stimuplex needle was guided to the lateral aspect of the bundle with the aid of a nerve stimulator and a biceps twitch was obtained down to a level of 0.4 mAmps before fade.  30 cc of 0.5% Ropivacaine was incrementally injected in easy fashion with no pain or resistance .Marland Kitchen Patient tolerated the procedure well.

## 2016-06-17 NOTE — Discharge Instructions (Addendum)
Keep dressing dry and intact.  May shower after dressing changed on post-op day #4 (Monday).  Cover staples with Band-Aids after drying off. Apply ice frequently to shoulder. Keep shoulder immobilizer on at all times except may remove for bathing purposes. Follow-up in 10-14 days or as scheduled.AMBULATORY SURGERY  DISCHARGE INSTRUCTIONS   1) The drugs that you were given will stay in your system until tomorrow so for the next 24 hours you should not:  A) Drive an automobile B) Make any legal decisions C) Drink any alcoholic beverage   2) You may resume regular meals tomorrow.  Today it is better to start with liquids and gradually work up to solid foods.  You may eat anything you prefer, but it is better to start with liquids, then soup and crackers, and gradually work up to solid foods.   3) Please notify your doctor immediately if you have any unusual bleeding, trouble breathing, redness and pain at the surgery site, drainage, fever, or pain not relieved by medication.    4) Additional Instructions:        Please contact your physician with any problems or Same Day Surgery at [phone], Monday through Friday 6 am to 4 pm, or [place] at [facility] number at [phone].

## 2016-06-17 NOTE — H&P (Signed)
Paper H&P to be scanned into permanent record. H&P reviewed. No changes. 

## 2016-06-17 NOTE — Transfer of Care (Signed)
Immediate Anesthesia Transfer of Care Note  Patient: Sheila Abbott  Procedure(s) Performed: Procedure(s): SHOULDER ARTHROSCOPY WITH OPEN ROTATOR CUFF REPAIR (Right)  Patient Location: PACU  Anesthesia Type:General  Level of Consciousness: sedated and responds to stimulation  Airway & Oxygen Therapy: Patient Spontanous Breathing and Patient connected to face mask oxygen  Post-op Assessment: Report given to RN and Post -op Vital signs reviewed and stable  Post vital signs: Reviewed and stable  Last Vitals:  Filed Vitals:   06/17/16 1105 06/17/16 1303  BP: 112/58 139/58  Pulse: 69 90  Temp:  36.2 C  Resp: 17 19    Last Pain:  Filed Vitals:   06/17/16 1306  PainSc: 7          Complications: No apparent anesthesia complications

## 2016-06-17 NOTE — Anesthesia Preprocedure Evaluation (Addendum)
Anesthesia Evaluation  Patient identified by MRN, date of birth, ID band Patient awake    Reviewed: Allergy & Precautions, NPO status , Patient's Chart, lab work & pertinent test results  Airway Mallampati: III  TM Distance: <3 FB     Dental  (+) Partial Upper, Partial Lower, Poor Dentition, Chipped   Pulmonary neg pulmonary ROS,    Pulmonary exam normal        Cardiovascular hypertension, Pt. on medications Normal cardiovascular exam     Neuro/Psych  Headaches, Depression Mini strokes vs. TIA TIA   GI/Hepatic negative GI ROS, Neg liver ROS,   Endo/Other  negative endocrine ROS  Renal/GU negative Renal ROS  negative genitourinary   Musculoskeletal  (+) Arthritis , Osteoarthritis,    Abdominal (+) + obese,   Peds negative pediatric ROS (+)  Hematology negative hematology ROS (+)   Anesthesia Other Findings   Reproductive/Obstetrics                            Anesthesia Physical Anesthesia Plan  ASA: III  Anesthesia Plan: General   Post-op Pain Management:  Regional for Post-op pain   Induction: Intravenous  Airway Management Planned: Oral ETT  Additional Equipment:   Intra-op Plan:   Post-operative Plan: Extubation in OR  Informed Consent: I have reviewed the patients History and Physical, chart, labs and discussed the procedure including the risks, benefits and alternatives for the proposed anesthesia with the patient or authorized representative who has indicated his/her understanding and acceptance.   Dental advisory given  Plan Discussed with: CRNA and Surgeon  Anesthesia Plan Comments: (Discussed with the patient the option of performing an interscalene block for post op pain per request of surgeon.  Patient understands all risks and benefits and wishes to proceed with the block.)        Anesthesia Quick Evaluation

## 2016-06-17 NOTE — Op Note (Addendum)
06/17/2016  12:53 PM  Patient:   Sheila Abbott  Pre-Op Diagnosis:   Impingement/tendinopathy with full-thickness rotator cuff tear, right shoulder.  Postoperative diagnosis: Impingement/tendinopathy with full-thickness rotator cuff tear and labral fraying, right shoulder.  Procedure: Limited arthroscopic debridement, arthroscopic subacromial decompression, and mini-open rotator cuff repair, right shoulder.  Anesthesia: General endotracheal with interscalene block placed preoperatively by the anesthesiologist.  Surgeon:   Pascal Lux, MD  Assistant:   Cameron Proud, PA-C  Findings: As above. The articular surfaces of the glenoid and humerus both were in satisfactory condition the labrum was intact circumferentially with mild fraying anterosuperiorly. Mild synovitis was noted anterosuperiorly and posterosuperiorly.   Complications: None  Fluids:   650 cc  Estimated blood loss: 5 cc  Tourniquet time: None  Drains: None  Closure: Staples   Brief clinical note: The patient is a 60 year old female with a history of right shoulder pain. The patient's symptoms have progressed despite medications, activity modification, etc. The patient's history and examination are consistent with impingement/tendinopathy with a rotator cuff tear. These findings were confirmed by MRI scan. The patient presents at this time for definitive management of these shoulder symptoms.  Procedure: The patient underwent placement of an interscalene block by the anesthesiologist in the preoperative holding area before she was brought into the operating room and lain in the supine position. The patient then underwent general endotracheal intubation and anesthesia before being repositioned in the beach chair position using the beach chair positioner. The right shoulder and upper extremity were prepped with ChloraPrep solution before being draped sterilely. Preoperative antibiotics were  administered. A timeout was performed to confirm the proper surgical site before the expected portal sites and incision site were injected with 0.5% Sensorcaine with epinephrine. A posterior portal was created and the glenohumeral joint thoroughly inspected with the findings as described above. An anterior portal was created using an outside-in technique. The labrum and rotator cuff were further probed, again confirming the above-noted findings. The areas of labral fraying and synovitis were debrided using the full-radius resector. The ArthroCare wand was inserted and used to obtain hemostasis as well as to "anneal" the labrum superiorly and anteriorly. The instruments were removed from the joint after suctioning the excess fluid.  The camera was repositioned through the posterior portal into the subacromial space. A separate lateral portal was created using an outside-in technique. The 3.5 mm full-radius resector was introduced and used to perform a subtotal bursectomy. The ArthroCare wand was then inserted and used to remove the periosteal tissue off the undersurface of the anterior third of the acromion as well as to recess the coracoacromial ligament from its attachment along the anterior and lateral margins of the acromion. The 4.0 mm acromionizing bur was introduced and used to complete the decompression by removing the undersurface of the anterior third of the acromion. The full radius resector was reintroduced to remove any residual bony debris before the ArthroCare wand was reintroduced to obtain hemostasis. The instruments were then removed from the subacromial space after suctioning the excess fluid.  An approximately 4-5 cm incision was made over the anterolateral aspect of the shoulder beginning at the anterolateral corner of the acromion and extending distally in line with the bicipital groove. This incision was carried down through the subcutaneous tissues to expose the deltoid fascia. The raphae  between the anterior and middle thirds was identified and this plane developed to provide access into the subacromial space. Additional bursal tissues were debrided sharply using Metzenbaum scissors.  The rotator cuff tear was readily identified. The margins were debrided sharply with a #15 blade and the exposed greater tuberosity roughened with a rongeur. The tear was repaired using two Biomet 2.9 mm JuggerKnot anchors. Several of these sutures were then brought back laterally using a [employer] anchor to create a two-layer closure. An apparent watertight closure was obtained.  The wound was copiously irrigated with sterile saline solution before the deltoid raphae was reapproximated using 2-0 Vicryl interrupted sutures. The subcutaneous tissues were closed in two layers using 2-0 Vicryl interrupted sutures before the skin was closed using staples. The portal sites also were closed using staples. A sterile bulky dressing was applied to the shoulder before the arm was placed into a shoulder immobilizer. The patient was then awakened, extubated, and returned to the recovery room in satisfactory condition after tolerating the procedure well.

## 2016-06-18 ENCOUNTER — Ambulatory Visit: Payer: Medicare Other | Admitting: Family Medicine

## 2016-06-19 NOTE — Anesthesia Postprocedure Evaluation (Signed)
Anesthesia Post Note  Patient: Sheila Abbott  Procedure(s) Performed: Procedure(s) (LRB): SHOULDER ARTHROSCOPY WITH OPEN ROTATOR CUFF REPAIR (Right)  Patient location during evaluation: PACU Anesthesia Type: General Level of consciousness: awake and alert and oriented Pain management: pain level controlled Vital Signs Assessment: post-procedure vital signs reviewed and stable Respiratory status: spontaneous breathing Cardiovascular status: blood pressure returned to baseline Anesthetic complications: no    Last Vitals:  Filed Vitals:   06/17/16 1439 06/17/16 1504  BP: 105/61 124/73  Pulse: 96 98  Temp:    Resp: 18 18    Last Pain:  Filed Vitals:   06/18/16 0855  PainSc: 7                  Robynn Marcel

## 2016-08-31 ENCOUNTER — Encounter: Payer: Self-pay | Admitting: Family Medicine

## 2016-08-31 ENCOUNTER — Ambulatory Visit (INDEPENDENT_AMBULATORY_CARE_PROVIDER_SITE_OTHER): Payer: Medicare Other | Admitting: Family Medicine

## 2016-08-31 ENCOUNTER — Telehealth: Payer: Self-pay | Admitting: Family Medicine

## 2016-08-31 ENCOUNTER — Ambulatory Visit
Admission: RE | Admit: 2016-08-31 | Discharge: 2016-08-31 | Disposition: A | Discharge status: Home / Self Care | Payer: Medicare Other | Source: Ambulatory Visit | Attending: Family Medicine | Admitting: Family Medicine

## 2016-08-31 VITALS — BP 127/55 | HR 82 | Temp 98.6°F | Resp 16 | Ht 62.0 in | Wt 217.4 lb

## 2016-08-31 DIAGNOSIS — M25572 Pain in left ankle and joints of left foot: Secondary | ICD-10-CM | POA: Insufficient documentation

## 2016-08-31 DIAGNOSIS — Z23 Encounter for immunization: Secondary | ICD-10-CM | POA: Diagnosis not present

## 2016-08-31 DIAGNOSIS — M25472 Effusion, left ankle: Secondary | ICD-10-CM | POA: Diagnosis not present

## 2016-08-31 DIAGNOSIS — M7989 Other specified soft tissue disorders: Secondary | ICD-10-CM | POA: Diagnosis not present

## 2016-08-31 DIAGNOSIS — I10 Essential (primary) hypertension: Secondary | ICD-10-CM | POA: Diagnosis not present

## 2016-08-31 LAB — BASIC METABOLIC PANEL WITH GFR
BUN: 15 mg/dL (ref 7–25)
CALCIUM: 9.4 mg/dL (ref 8.6–10.4)
CHLORIDE: 103 mmol/L (ref 98–110)
CO2: 25 mmol/L (ref 20–31)
Creat: 0.88 mg/dL (ref 0.50–0.99)
GFR, EST AFRICAN AMERICAN: 83 mL/min (ref >=60)
GFR, EST NON AFRICAN AMERICAN: 72 mL/min (ref >=60)
GLUCOSE: 113 mg/dL — AB (ref 65–99)
POTASSIUM: 3.8 mmol/L (ref 3.5–5.3)
SODIUM: 137 mmol/L (ref 135–146)

## 2016-08-31 LAB — D-DIMER, QUANTITATIVE: D-Dimer, Quant: 0.33 mcg/mL FEU (ref <0.50)

## 2016-08-31 LAB — SEDIMENTATION RATE: Sed Rate: 16 mm/hr (ref 0–30)

## 2016-08-31 LAB — URIC ACID: Uric Acid, Serum: 5.2 mg/dL (ref 2.5–7.0)

## 2016-08-31 MED ORDER — NAPROXEN 500 MG PO TABS: 500.0000 mg | ORAL_TABLET | Freq: Two times a day (BID) | ORAL | 1 refills | Status: DC

## 2016-08-31 NOTE — Progress Notes (Signed)
Subjective:    Patient ID: Sheila Abbott, female    DOB: 02-01-1956, 60 y.o.   MRN: [id number]  HPI: Sheila Abbott is a 60 y.o. female presenting on 08/31/2016 for Claudication (Onset for 3 months, left foot swollen and turns red and right one is stiffiness in ankle half way up the leg ) and Sleep Apnea (onset for month and a half )   HPI    Pt presents for foot pain. L foot pain and swelling over past 3 mos. Can't stand to have it touched. Feels like walking on marshmallows. Intermittently swells and turns purple red- from lateral malleous to top of the foot. Socks hurt the L foot. Went to urgent care- did XR of the foot- unexplained soft swelling of the tissues. Has some tenderness in the calf. Recently shoulder surgery- has not been as active. Does have history of stroke.  Home treatment: Aleve PRN. No relief.  R ankle stiffness- difficulty walking up the stairs. No trauma or injury. Does residual from stroke on that side.   Past Medical History:  Diagnosis Date  . Depression   . Hyperlipidemia   . Hypertension   . Migraine   . Stroke (cerebrum) ([place])    2 mini    Current Outpatient Prescriptions on File Prior to Visit  Medication Sig  . amLODipine (NORVASC) 5 MG tablet Take 1 tablet (5 mg total) by mouth at bedtime.  Marland Kitchen aspirin 325 MG EC tablet Take 325 mg by mouth daily.  Marland Kitchen gabapentin (NEURONTIN) 300 MG capsule Take 3 capsules in the morning and 3 capsules at night.  Marland Kitchen lisinopril-hydrochlorothiazide (PRINZIDE,ZESTORETIC) 20-12.5 MG tablet Take 2 tablets by mouth daily.  . meclizine (ANTIVERT) 25 MG tablet Take 1 tablet (25 mg total) by mouth 3 (three) times daily as needed for dizziness.  . ondansetron (ZOFRAN) 4 MG tablet Take 1 tablet (4 mg total) by mouth every 8 (eight) hours as needed for nausea or vomiting.  Marland Kitchen rOPINIRole (REQUIP) 0.25 MG tablet Take 1 tablet at bedtime for 1 week and then increase up to 2 tablets at bedtime.  . sertraline (ZOLOFT) 100 MG tablet Take 1  tablet (100 mg total) by mouth daily.  . simvastatin (ZOCOR) 20 MG tablet Take 1 tablet (20 mg total) by mouth at bedtime.  Marland Kitchen oxyCODONE (ROXICODONE) 5 MG immediate release tablet Take 1-2 tablets (5-10 mg total) by mouth every 4 (four) hours as needed for severe pain. (Patient not taking: Reported on 08/31/2016)   No current facility-administered medications on file prior to visit.     Review of Systems  Constitutional: Negative for chills and fever.  HENT: Negative.   Respiratory: Negative for cough, chest tightness and wheezing.   Cardiovascular: Negative for chest pain and leg swelling.  Gastrointestinal: Negative for abdominal pain, constipation, diarrhea, nausea and vomiting.  Endocrine: Negative.  Negative for cold intolerance, heat intolerance, polydipsia, polyphagia and polyuria.  Genitourinary: Negative for difficulty urinating and dysuria.  Musculoskeletal: Positive for arthralgias and joint swelling.  Skin: Positive for color change (ruddy color of the anke.). Negative for rash and wound.  Neurological: Negative for dizziness, light-headedness and numbness.  Psychiatric/Behavioral: Negative.    Per HPI unless specifically indicated above     Objective:    BP (!) 127/55 (BP Location: Left Arm, Patient Position: Sitting, Cuff Size: Large)   Pulse 82   Temp 98.6 F (37 C) (Oral)   Resp 16   Ht _0  (1.575 m)   Wt  217 lb 6.4 oz (98.6 kg)   LMP 12/13/2010 Comment: post menoposal  BMI 39.76 kg/m   Wt Readings from Last 3 Encounters:  08/31/16 217 lb 6.4 oz (98.6 kg)  06/08/16 214 lb (97.1 kg)  05/28/16 214 lb (97.1 kg)    Physical Exam  Constitutional: She is oriented to person, place, and time. She appears well-developed and well-nourished.  HENT:  Head: Normocephalic and atraumatic.  Neck: Neck supple.  Cardiovascular: Normal rate, regular rhythm and normal heart sounds.  Exam reveals no gallop and no friction rub.   No murmur heard. Pulmonary/Chest: Effort  normal and breath sounds normal. She has no wheezes. She exhibits no tenderness.  Abdominal: Soft. Normal appearance and bowel sounds are normal. She exhibits no distension and no mass. There is no tenderness. There is no rebound and no guarding.  Musculoskeletal: She exhibits no edema.       Right ankle: She exhibits normal range of motion, no swelling, no ecchymosis and normal pulse. No tenderness.       Left ankle: She exhibits decreased range of motion (guarding 2/2 pain) and swelling. She exhibits normal pulse. Tenderness. Lateral malleolus tenderness found. Achilles tendon normal.  Lymphadenopathy:    She has no cervical adenopathy.  Neurological: She is alert and oriented to person, place, and time.  Skin: Skin is warm and dry.   Results for orders placed or performed in visit on 05/17/16  CBC with Differential/Platelet  Result Value Ref Range   WBC 8.0 3.8 - 10.8 K/uL   RBC 4.24 3.80 - 5.10 MIL/uL   Hemoglobin 13.6 11.7 - 15.5 g/dL   HCT 39.3 35.0 - 45.0 %   MCV 92.7 80.0 - 100.0 fL   MCH 32.1 27.0 - 33.0 pg   MCHC 34.6 32.0 - 36.0 g/dL   RDW 13.8 11.0 - 15.0 %   Platelets 181 140 - 400 K/uL   MPV 12.0 7.5 - 12.5 fL   Neutro Abs 5,520 1,500 - 7,800 cells/uL   Lymphs Abs 1,520 850 - 3,900 cells/uL   Monocytes Absolute 720 200 - 950 cells/uL   Eosinophils Absolute 240 15 - 500 cells/uL   Basophils Absolute 0 0 - 200 cells/uL   Neutrophils Relative % 69 %   Lymphocytes Relative 19 %   Monocytes Relative 9 %   Eosinophils Relative 3 %   Basophils Relative 0 %   Smear Review Criteria for review not met   VITAMIN D 25 Hydroxy (Vit-D Deficiency, Fractures)  Result Value Ref Range   Vit D, 25-Hydroxy 25 (L) 30 - 100 ng/mL  Comprehensive metabolic panel  Result Value Ref Range   Sodium 139 135 - 146 mmol/L   Potassium 3.7 3.5 - 5.3 mmol/L   Chloride 104 98 - 110 mmol/L   CO2 23 20 - 31 mmol/L   Glucose, Bld 98 65 - 99 mg/dL   BUN 15 7 - 25 mg/dL   Creat 0.67 0.50 - 0.99  mg/dL   Total Bilirubin 0.6 0.2 - 1.2 mg/dL   Alkaline Phosphatase 71 33 - 130 U/L   AST 15 10 - 35 U/L   ALT 13 6 - 29 U/L   Total Protein 7.0 6.1 - 8.1 g/dL   Albumin 4.2 3.6 - 5.1 g/dL   Calcium 9.2 8.6 - 10.4 mg/dL  Ferritin  Result Value Ref Range   Ferritin 53 20 - 288 ng/mL  Iron Binding Cap (TIBC)  Result Value Ref Range   Iron 124 45 -  160 ug/dL   UIBC 187 125 - 400 ug/dL   TIBC 311 250 - 450 ug/dL   %SAT 40 11 - 50 %      Assessment & Plan:   Problem List Items Addressed This Visit      Cardiovascular and Mediastinum   Hypertension    Check BMP.       Relevant Orders   BASIC METABOLIC PANEL WITH GFR    Other Visit Diagnoses    Pain and swelling of left ankle    -  Primary   Gout vs. orthopedic issue. r/o bloot clot with recent surgery by D-dimer. [place] if indicated. Naproxen for pain. XR today. Check uric acid- consider colchicine with index of suspicion for gout. Alarm symptoms reviewed. Recheck 2 weeks. Consider podiatry at that time.    Relevant Medications   naproxen (NAPROSYN) 500 MG tablet   Other Relevant Orders   DG Ankle Complete Left   Uric acid   Sed Rate (ESR)   C-reactive protein   D-Dimer, Quantitative   Need for influenza vaccination       Relevant Orders   Flu Vaccine QUAD 36+ mos PF IM (Fluarix & Fluzone Quad PF) (Completed)      Meds ordered this encounter  Medications  . naproxen (NAPROSYN) 500 MG tablet    Sig: Take 1 tablet (500 mg total) by mouth 2 (two) times daily with a meal.    Dispense:  30 tablet    Refill:  1    Order Specific Question:   Supervising Provider    Answer:   Arlis Porta [[id number]]      Follow up plan: Return in about 2 weeks (around 09/14/2016), or if symptoms worsen or fail to improve, for Foot pain.

## 2016-08-31 NOTE — Telephone Encounter (Signed)
Called to review labs. LMTCB. Will release labs to mychart.

## 2016-08-31 NOTE — Patient Instructions (Signed)
L ankle pain: We will get an XR to ensure there is no fracture. We will do a blood test to determine if there is a chance of a clot in your leg and proceed to ultrasound if needed.  Take naproxen 500mg  twice daily for the pain and swelling. Pending lab tests- if it seems like gout, we will treat for that. We may have you see podiatry and [place] to follow-up on the foot.  Severe pain, shortness of breath, chest pain, other concerning symptoms to the ER.

## 2016-08-31 NOTE — Assessment & Plan Note (Signed)
Check BMP .  

## 2016-09-01 LAB — C-REACTIVE PROTEIN: CRP: 2.7 mg/L (ref <8.0)

## 2016-09-01 NOTE — Progress Notes (Signed)
hom

## 2016-09-23 ENCOUNTER — Ambulatory Visit
Admission: RE | Admit: 2016-09-23 | Discharge: 2016-09-23 | Disposition: A | Discharge status: Home / Self Care | Payer: Medicare Other | Source: Ambulatory Visit | Attending: Family Medicine | Admitting: Family Medicine

## 2016-09-23 ENCOUNTER — Encounter: Payer: Self-pay | Admitting: Family Medicine

## 2016-09-23 ENCOUNTER — Telehealth: Payer: Self-pay | Admitting: Family Medicine

## 2016-09-23 ENCOUNTER — Ambulatory Visit (INDEPENDENT_AMBULATORY_CARE_PROVIDER_SITE_OTHER): Payer: Medicare Other | Admitting: Family Medicine

## 2016-09-23 VITALS — BP 120/66 | HR 77 | Temp 98.0°F | Resp 16 | Ht 62.0 in | Wt 221.0 lb

## 2016-09-23 DIAGNOSIS — M7661 Achilles tendinitis, right leg: Secondary | ICD-10-CM | POA: Diagnosis present

## 2016-09-23 DIAGNOSIS — M7731 Calcaneal spur, right foot: Secondary | ICD-10-CM | POA: Diagnosis not present

## 2016-09-23 DIAGNOSIS — M25472 Effusion, left ankle: Secondary | ICD-10-CM | POA: Diagnosis not present

## 2016-09-23 DIAGNOSIS — M25572 Pain in left ankle and joints of left foot: Secondary | ICD-10-CM

## 2016-09-23 DIAGNOSIS — M7989 Other specified soft tissue disorders: Secondary | ICD-10-CM | POA: Insufficient documentation

## 2016-09-23 DIAGNOSIS — M79672 Pain in left foot: Secondary | ICD-10-CM

## 2016-09-23 MED ORDER — NAPROXEN 500 MG PO TABS: 500.0000 mg | ORAL_TABLET | Freq: Two times a day (BID) | ORAL | 0 refills | Status: AC

## 2016-09-23 NOTE — Progress Notes (Addendum)
Subjective:    Patient ID: Sheila Abbott, female    DOB: 08/29/1956, 60 y.o.   MRN: [id number]  HPI: Sheila Abbott is a 60 y.o. female presenting on 09/23/2016 for Foot Pain (Both swollen hard to walk and discoloration on left  , knot on right ankle . Onset for 3 months getting worst . )   HPI  Pt presents for follow-up of bilateral foot pain. Still feels like she is walking on marshmellows on the bottom of L foot. R foot has a red swollen area at the back of the heel. Very painful. No known injury. That started 2 weeks ago. Popped up red and swollen overnight. Felt no pop/ click at the heel. Most painful stepping down in the morning and going up and down stairs. Hurts to flex the foot. Has been icing the heel with mild relief.  L  foot swells intermittently. Naproxen did not help with pain but helped with swelling.   Past Medical History:  Diagnosis Date  . Depression   . Hyperlipidemia   . Hypertension   . Migraine   . Stroke (cerebrum) (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 amLODipine (NORVASC) 5 MG tablet Take 1 tablet (5 mg total) by mouth at bedtime.  Marland Kitchen aspirin 325 MG EC tablet Take 325 mg by mouth daily.  Marland Kitchen gabapentin (NEURONTIN) 300 MG capsule Take 3 capsules in the morning and 3 capsules at night.  Marland Kitchen lisinopril-hydrochlorothiazide (PRINZIDE,ZESTORETIC) 20-12.5 MG tablet Take 2 tablets by mouth daily.  . meclizine (ANTIVERT) 25 MG tablet Take 1 tablet (25 mg total) by mouth 3 (three) times daily as needed for dizziness.  . naproxen (NAPROSYN) 500 MG tablet Take 1 tablet (500 mg total) by mouth 2 (two) times daily with a meal.  . ondansetron (ZOFRAN) 4 MG tablet Take 1 tablet (4 mg total) by mouth every 8 (eight) hours as needed for nausea or vomiting.  Marland Kitchen rOPINIRole (REQUIP) 0.25 MG tablet Take 1 tablet at bedtime for 1 week and then increase up to 2 tablets at bedtime.  . sertraline (ZOLOFT) 100 MG tablet Take 1  tablet (100 mg total) by mouth daily.  . simvastatin (ZOCOR) 20 MG tablet Take 1 tablet (20 mg total) by mouth at bedtime.   No current facility-administered medications on file prior to visit.     Review of Systems  Constitutional: Negative for chills and fever.  HENT: Negative.   Respiratory: Negative for cough, chest tightness and wheezing.   Cardiovascular: Negative for chest pain and leg swelling.  Gastrointestinal: Negative for abdominal pain, constipation, diarrhea, nausea and vomiting.  Endocrine: Negative.  Negative for cold intolerance, heat intolerance, polydipsia, polyphagia and polyuria.  Genitourinary: Negative for difficulty urinating and dysuria.  Musculoskeletal: Positive for arthralgias and joint swelling.  Neurological: Negative for dizziness, light-headedness and numbness.  Psychiatric/Behavioral: Negative.    Per HPI unless specifically indicated above     Objective:    BP 120/66 (BP Location: Left Arm, Patient Position: Sitting, Cuff Size: Large)   Pulse 77   Temp 98 F (36.7 C) (Oral)   Resp 16   Ht '5\' 2"'  (1.575 m)   Wt 221 lb (100.2 kg)   LMP 12/13/2010 Comment: post menoposal  BMI 40.42 kg/m   Wt Readings from Last 3 Encounters:  09/23/16 221 lb (100.2 kg)  09/01/16 217 lb (98.4 kg)  08/31/16 217 lb 6.4 oz (98.6  kg)    Physical Exam  Constitutional: She is oriented to person, place, and time. She appears well-developed and well-nourished.  HENT:  Head: Normocephalic and atraumatic.  Neck: Neck supple.  Cardiovascular: Normal rate, regular rhythm and normal heart sounds.  Exam reveals no gallop and no friction rub.   No murmur heard. Pulmonary/Chest: Effort normal and breath sounds normal. She has no wheezes. She exhibits no tenderness.  Abdominal: Soft. Normal appearance and bowel sounds are normal. She exhibits no distension and no mass. There is no tenderness. There is no rebound and no guarding.  Musculoskeletal: She exhibits no edema or  tenderness.       Right ankle: Achilles tendon exhibits pain and defect (large nodule at the back of the heel- tender and swollen. ). Achilles tendon exhibits normal Thompson's test results.       Left ankle: She exhibits decreased range of motion (Decreased plantar flexion). She exhibits no swelling, no ecchymosis, no deformity and normal pulse. No tenderness. Achilles tendon normal. Achilles tendon exhibits no pain, no defect and normal Thompson's test results.  Lymphadenopathy:    She has no cervical adenopathy.  Neurological: She is alert and oriented to person, place, and time.  Skin: Skin is warm and dry.   Results for orders placed or performed in visit on 08/31/16  Uric acid  Result Value Ref Range   Uric Acid, Serum 5.2 2.5 - 7.0 mg/dL  BASIC METABOLIC PANEL WITH GFR  Result Value Ref Range   Sodium 137 135 - 146 mmol/L   Potassium 3.8 3.5 - 5.3 mmol/L   Chloride 103 98 - 110 mmol/L   CO2 25 20 - 31 mmol/L   Glucose, Bld 113 (H) 65 - 99 mg/dL   BUN 15 7 - 25 mg/dL   Creat 0.88 0.50 - 0.99 mg/dL   Calcium 9.4 8.6 - 10.4 mg/dL   GFR, Est African American 83 >=60 mL/min   GFR, Est Non African American 72 >=60 mL/min  Sed Rate (ESR)  Result Value Ref Range   Sed Rate 16 0 - 30 mm/hr  C-reactive protein  Result Value Ref Range   CRP 2.7 <8.0 mg/L  D-Dimer, Quantitative  Result Value Ref Range   D-Dimer, Quant 0.33 <0.50 mcg/mL FEU      Assessment & Plan:   Problem List Items Addressed This Visit    None    Visit Diagnoses    Achilles tendinitis of right lower extremity    -  Primary   Exam and XR consistent with achilles. No rupture. NSAIDs, ice, and rest. Referral to podiatry made. Alarm symptoms reviewed.    Relevant Orders   DG Ankle Complete Right (Completed)   Ambulatory referral to Podiatry   Left foot pain       Likely neuropathy related. Will continue with NSAIDs for swelling. pt will see podiatry on 10/07/2016 for follow-up.    Relevant Orders    Ambulatory referral to Podiatry      Meds ordered this encounter  Medications  . Melatonin 3 MG TABS    Sig: Take by mouth.      Follow up plan: Return in about 2 months (around 11/23/2016), or if symptoms worsen or fail to improve, for Foot pain- follow-up with podiatry .

## 2016-09-23 NOTE — Telephone Encounter (Signed)
Called to review XR results- she has a pinger number- if she calls back please let her know:  Looks like it is achilles tendonitis based on XR. I have made her an appt to see Dr. Cleda Mccreedy at [facility] on 10/26 at 1015 AM.   In the meantime- continue to take Naproxen 500mg  twice daily.  Ice the heel 2-3 times daily. Avoid strenuous activity.

## 2016-09-23 NOTE — Addendum Note (Signed)
Addended by: Elita Dame L on: 09/23/2016 11:00 AM   Modules accepted: Orders

## 2016-09-23 NOTE — Patient Instructions (Signed)
Keep taking naproxen to help with swelling. We have placed a referral to podiatry to have you seen about your feet.   Try icing the heel at night to help with pain. Go to the ER or urgent care with severe pain.

## 2016-12-28 ENCOUNTER — Other Ambulatory Visit: Payer: Self-pay | Admitting: Family Medicine

## 2016-12-28 DIAGNOSIS — F329 Major depressive disorder, single episode, unspecified: Secondary | ICD-10-CM

## 2016-12-28 DIAGNOSIS — F32A Depression, unspecified: Secondary | ICD-10-CM

## 2016-12-28 MED ORDER — SERTRALINE HCL 100 MG PO TABS: 100.0000 mg | ORAL_TABLET | Freq: Every day | ORAL | 11 refills | Status: AC

## 2017-01-20 ENCOUNTER — Other Ambulatory Visit: Payer: Self-pay | Admitting: Family Medicine

## 2017-01-20 DIAGNOSIS — E785 Hyperlipidemia, unspecified: Secondary | ICD-10-CM

## 2017-01-20 DIAGNOSIS — I10 Essential (primary) hypertension: Secondary | ICD-10-CM

## 2017-01-20 MED ORDER — AMLODIPINE BESYLATE 5 MG PO TABS: 5.0000 mg | ORAL_TABLET | Freq: Every day | ORAL | 2 refills | Status: DC

## 2017-01-20 MED ORDER — SIMVASTATIN 20 MG PO TABS: 20.0000 mg | ORAL_TABLET | Freq: Every day | ORAL | 2 refills | Status: AC

## 2017-01-20 NOTE — Progress Notes (Signed)
Patient requesting refills

## 2017-02-14 ENCOUNTER — Other Ambulatory Visit: Payer: Self-pay | Admitting: Family Medicine

## 2017-02-14 DIAGNOSIS — I10 Essential (primary) hypertension: Secondary | ICD-10-CM

## 2017-02-14 MED ORDER — LISINOPRIL-HYDROCHLOROTHIAZIDE 20-12.5 MG PO TABS: 2.0000 | ORAL_TABLET | Freq: Every day | ORAL | 0 refills | Status: DC

## 2017-02-14 MED ORDER — LISINOPRIL-HYDROCHLOROTHIAZIDE 20-12.5 MG PO TABS: 2.0000 | ORAL_TABLET | Freq: Every day | ORAL | 0 refills | Status: AC

## 2017-03-02 ENCOUNTER — Ambulatory Visit (INDEPENDENT_AMBULATORY_CARE_PROVIDER_SITE_OTHER): Payer: Self-pay | Admitting: Family Medicine

## 2017-03-03 ENCOUNTER — Ambulatory Visit (INDEPENDENT_AMBULATORY_CARE_PROVIDER_SITE_OTHER): Payer: 59 | Admitting: Family Medicine

## 2017-03-03 ENCOUNTER — Ambulatory Visit: Payer: 59 | Attending: Family Medicine

## 2017-03-03 ENCOUNTER — Encounter (INDEPENDENT_AMBULATORY_CARE_PROVIDER_SITE_OTHER): Payer: Self-pay | Admitting: Family Medicine

## 2017-03-03 VITALS — BP 104/75 | HR 77 | Temp 98.6°F | Resp 12 | Ht 61.8 in | Wt 226.0 lb

## 2017-03-03 DIAGNOSIS — Z8673 Personal history of transient ischemic attack (TIA), and cerebral infarction without residual deficits: Secondary | ICD-10-CM

## 2017-03-03 DIAGNOSIS — M7660 Achilles tendinitis, unspecified leg: Secondary | ICD-10-CM

## 2017-03-03 DIAGNOSIS — R5383 Other fatigue: Secondary | ICD-10-CM

## 2017-03-03 DIAGNOSIS — I10 Essential (primary) hypertension: Secondary | ICD-10-CM

## 2017-03-03 DIAGNOSIS — F329 Major depressive disorder, single episode, unspecified: Secondary | ICD-10-CM

## 2017-03-03 DIAGNOSIS — F32A Depression, unspecified: Secondary | ICD-10-CM

## 2017-03-03 LAB — COMPREHENSIVE METABOLIC PANEL, NON-FASTING
ALBUMIN/GLOBULIN RATIO: 1.8 (ref 0.8–2.0)
ALBUMIN: 4.2 g/dL (ref 3.5–5.0)
ALKALINE PHOSPHATASE: 79 U/L (ref 38–126)
ALT (SGPT): 21 U/L (ref 14–54)
ANION GAP: 8 mmol/L (ref 3–11)
AST (SGOT): 30 U/L (ref 15–41)
BILIRUBIN TOTAL: 1.5 mg/dL — ABNORMAL HIGH (ref 0.3–1.2)
BUN/CREA RATIO: 24 — ABNORMAL HIGH (ref 6–22)
BUN: 20 mg/dL (ref 6–20)
CALCIUM: 9.4 mg/dL (ref 8.8–10.2)
CHLORIDE: 102 mmol/L (ref 101–111)
CO2 TOTAL: 26 mmol/L (ref 22–32)
CREATININE: 0.83 mg/dL (ref 0.44–1.00)
ESTIMATED GFR: >60 mL/min/1.73mˆ2 (ref >60)
GLUCOSE: 118 mg/dL — ABNORMAL HIGH (ref 70–110)
POTASSIUM: 4.4 mmol/L (ref 3.4–5.1)
PROTEIN TOTAL: 6.5 g/dL (ref 6.4–8.3)
SODIUM: 136 mmol/L (ref 136–145)

## 2017-03-03 LAB — LIPID PANEL
CHOL/HDL RATIO: 3.3
CHOLESTEROL: 196 mg/dL (ref 120–199)
HDL CHOL: 60 mg/dL (ref 45–65)
LDL CALC: 106 mg/dL (ref <=130)
TRIGLYCERIDES: 148 mg/dL — ABNORMAL HIGH (ref 35–135)
VLDL CALC: 30 mg/dL (ref 5–35)

## 2017-03-03 LAB — THYROID STIMULATING HORMONE WITH FREE T4 REFLEX: TSH: 2.284 u[IU]/mL (ref 0.340–5.330)

## 2017-03-03 LAB — CBC WITH DIFF
BASOPHIL #: 0 x10?3/uL (ref 0.00–0.10)
BASOPHIL %: 0 % (ref 0–3)
EOSINOPHIL #: 0.2 x10ˆ3/uL (ref 0.00–0.50)
EOSINOPHIL %: 3 % (ref 0–5)
HCT: 38.7 % (ref 36.0–45.0)
HGB: 13.9 g/dL (ref 12.0–15.5)
LYMPHOCYTE #: 1.5 x10ˆ3/uL (ref 1.00–4.80)
LYMPHOCYTE %: 17 % (ref 15–43)
MCH: 33.6 pg — ABNORMAL HIGH (ref 27.5–33.2)
MCHC: 35.9 g/dL (ref 32.0–36.0)
MCV: 93.6 fL (ref 82.0–97.0)
MONOCYTE #: 0.7 x10ˆ3/uL (ref 0.20–0.90)
MONOCYTE %: 8 % (ref 5–12)
MPV: 10.7 fL — ABNORMAL HIGH (ref 7.4–10.5)
NEUTROPHIL #: 6.1 x10ˆ3/uL (ref 1.50–6.50)
NEUTROPHIL %: 71 % (ref 43–76)
PLATELETS: 177 x10ˆ3/uL (ref 150–450)
RBC: 4.14 x10ˆ6/uL (ref 4.00–5.10)
RDW: 12.5 % (ref 11.0–16.0)
WBC: 8.6 x10ˆ3/uL (ref 4.0–11.0)

## 2017-03-03 LAB — VITAMIN B12: VITAMIN B 12: 285 pg/mL (ref 180–914)

## 2017-03-03 LAB — FOLATE: FOLATE: 19.3 ng/mL (ref >=4.5)

## 2017-03-03 MED ORDER — SERTRALINE 100 MG TABLET
ORAL_TABLET | ORAL | 2 refills | Status: DC
Start: 2017-03-03 — End: 2017-10-31

## 2017-03-03 MED ORDER — GABAPENTIN 300 MG CAPSULE
ORAL_CAPSULE | ORAL | 2 refills | Status: DC
Start: 2017-03-03 — End: 2018-05-10

## 2017-03-03 MED ORDER — SIMVASTATIN 20 MG TABLET
20.0000 mg | ORAL_TABLET | Freq: Every evening | ORAL | 2 refills | Status: DC
Start: 2017-03-03 — End: 2017-10-31

## 2017-03-03 MED ORDER — LISINOPRIL 20 MG-HYDROCHLOROTHIAZIDE 12.5 MG TABLET
1.0000 | ORAL_TABLET | Freq: Two times a day (BID) | ORAL | 2 refills | Status: DC
Start: 2017-03-03 — End: 2017-10-31

## 2017-03-03 NOTE — Progress Notes (Signed)
[facility]  [address]  [place] [address]  Phone: [phone]  Fax: [phone]    Encounter Date: 03/03/2017    Patient ID:  Toni Tran  MVH:[id number]    DOB: 1956/10/08  Age: 61 y.o. female    Subjective:     Chief Complaint   Patient presents with   . New Patient     HPI   60y/o obese female w/ h/o HTN, depression, CVA X2 here to establish care. Needs a new pcp. Mentions issue w/ achilles tendon on right that was to be operated on. Mentions mood needs to be better and may need increase of her zoloft dose. No suicidal or homicidal ideation.   Current Outpatient Prescriptions   Medication Sig   . amLODIPine (NORVASC) 5 mg Oral Tablet Take 5 mg by mouth Once a day AMLODIPINE BESYLATE   . aspirin 325 mg Oral Tablet Take 325 mg by mouth Once a day   . gabapentin (NEURONTIN) 300 mg Oral Capsule 3 IN THE MORNING AND 3 AT BEDTIME   . lisinopril-hydrochlorothiazide (ZESTORETIC) 20-12.5 mg Oral Tablet Take 1 Tab by mouth Twice daily   . rOPINIRole (REQUIP) 0.25 mg Oral Tablet Take 0.25 mg by mouth Three times a day ROPINIROLE HCL   . sertraline (ZOLOFT) 100 mg Oral Tablet 1.5tab po daily   . simvastatin (ZOCOR) 20 mg Oral Tablet Take 1 Tab (20 mg total) by mouth Every evening     No Known Allergies  Past Medical History:   Diagnosis Date   . Anxiety    . CVA (cerebrovascular accident) ([facility])    . Depression    . Hypercholesterolemia    . Hypertension          Past Surgical History:   Procedure Laterality Date   . EYE SURGERY Bilateral    . HX APPENDECTOMY     . SHOULDER SURGERY Right          Family Medical History     Problem Relation (Age of Onset)    Alzheimer's/Dementia Mother, Father    Arthritis-osteo Mother, Father    Blood Clots Father    Congestive Heart Failure Father    Coronary Artery Disease Father    Diabetes Father    Heart Attack Mother, Father    High Cholesterol Mother, Father    Hypertension Mother, Father    Migraines Mother, Father    Sleep  disorders Father    Stroke Mother, Father            Social History   Substance Use Topics   . Smoking status: Never Smoker   . Smokeless tobacco: Never Used   . Alcohol use None       Review of Systems   Constitutional: Positive for fatigue.   HENT: Negative.    Eyes: Negative.    Respiratory: Negative.    Cardiovascular: Negative.    Gastrointestinal: Negative.    Musculoskeletal: Positive for arthralgias and myalgias.   Skin: Negative.    Neurological: Negative.    Psychiatric/Behavioral: The patient is nervous/anxious.      Objective:   Vitals: BP 104/75  Pulse 77  Temp 37 C (98.6 F)  Resp 12  Ht 1.57 m (5' 1.8")  Wt 102.5 kg (226 lb)  SpO2 96%  Breastfeeding? No  BMI 41.6 kg/m2    Physical Exam   Constitutional: She is oriented to person, place, and time. She appears well-developed and well-nourished.   Obese  Fatigue appearing   HENT:   Head: Normocephalic and atraumatic.   Eyes: Pupils are equal, round, and reactive to light.   Neck: Normal range of motion.   Cardiovascular: Normal rate, regular rhythm and normal heart sounds.    Pulmonary/Chest: Effort normal and breath sounds normal.   Abdominal: Soft. Bowel sounds are normal.   Neurological: She is alert and oriented to person, place, and time.   Skin: Skin is warm.   Psychiatric: Her behavior is normal. Judgment and thought content normal.   anxious     Assessment & Plan:     ENCOUNTER DIAGNOSES     ICD-10-CM   1. Achilles tendon pain M76.60   2. Essential hypertension I10   3. Depression, unspecified depression type F32.9   4. H/O: CVA (cerebrovascular accident) 66Z86.73   5. Fatigue, unspecified type R53.83     labwork ordered  Podiatry referral  Follow up in 2wks.   Orders Placed This Encounter   . COMPREHENSIVE METABOLIC PANEL, NON-FASTING   . CBC/DIFF   . LIPID PANEL   . FOLATE   . VITAMIN B12   . VITAMIN D 25, TOTAL   . THYROID STIMULATING HORMONE WITH FREE T4 REFLEX   . OUTSIDE CONSULT/REFERRAL PROVIDER(AMB)   . simvastatin (ZOCOR) 20 mg  Oral Tablet   . lisinopril-hydrochlorothiazide (ZESTORETIC) 20-12.5 mg Oral Tablet   . gabapentin (NEURONTIN) 300 mg Oral Capsule   . sertraline (ZOLOFT) 100 mg Oral Tablet           Thelma BargeNgozi Ude-Oshiyoye, MD

## 2017-03-04 LAB — VITAMIN D 25, TOTAL: VITAMIN D, 25OH: 14 ng/mL — ABNORMAL LOW (ref 30–100)

## 2017-03-17 ENCOUNTER — Ambulatory Visit (INDEPENDENT_AMBULATORY_CARE_PROVIDER_SITE_OTHER): Payer: 59 | Admitting: Family Medicine

## 2017-03-17 ENCOUNTER — Ambulatory Visit (HOSPITAL_BASED_OUTPATIENT_CLINIC_OR_DEPARTMENT_OTHER): Payer: 59

## 2017-03-17 ENCOUNTER — Encounter (INDEPENDENT_AMBULATORY_CARE_PROVIDER_SITE_OTHER): Payer: Self-pay | Admitting: Family Medicine

## 2017-03-17 ENCOUNTER — Ambulatory Visit (HOSPITAL_BASED_OUTPATIENT_CLINIC_OR_DEPARTMENT_OTHER)
Admission: RE | Admit: 2017-03-17 | Discharge: 2017-03-17 | Disposition: A | Discharge status: Home / Self Care | Payer: 59 | Source: Ambulatory Visit | Attending: Family Medicine | Admitting: Family Medicine

## 2017-03-17 VITALS — BP 104/66 | HR 82 | Temp 97.4°F | Resp 12 | Ht 61.8 in | Wt 227.0 lb

## 2017-03-17 DIAGNOSIS — M79672 Pain in left foot: Secondary | ICD-10-CM

## 2017-03-17 DIAGNOSIS — M79606 Pain in leg, unspecified: Secondary | ICD-10-CM

## 2017-03-17 DIAGNOSIS — M25579 Pain in unspecified ankle and joints of unspecified foot: Secondary | ICD-10-CM

## 2017-03-17 DIAGNOSIS — M8580 Other specified disorders of bone density and structure, unspecified site: Secondary | ICD-10-CM | POA: Insufficient documentation

## 2017-03-17 MED ORDER — OXYCODONE-ACETAMINOPHEN 5 MG-325 MG TABLET
1.00 | ORAL_TABLET | Freq: Three times a day (TID) | ORAL | 0 refills | Status: DC | PRN
Start: 2017-03-17 — End: 2019-11-16

## 2017-03-17 NOTE — Progress Notes (Signed)
[facility]  [address]  [place] [address]  Phone: [phone]  Fax: [phone]    Encounter Date: 03/17/2017    Patient ID:  Toni Tran  YQI:[id number]    DOB: Jul 21, 1956  Age: 61 y.o. female    Subjective:     Chief Complaint   Patient presents with   . Medication follow up     Rleg pain due to recent fall   . Leg Injury     HPI   60y/o female s/p fall yesterday. In a lot of pain, worse w/ use. Supposed podiatry on Monday for right foot for evaluation of bone spur. Severe pain w/ bearing weight on right foot/leg.   Current Outpatient Prescriptions   Medication Sig   . amLODIPine (NORVASC) 5 mg Oral Tablet Take 5 mg by mouth Once a day AMLODIPINE BESYLATE   . aspirin 325 mg Oral Tablet Take 325 mg by mouth Once a day   . gabapentin (NEURONTIN) 300 mg Oral Capsule 3 IN THE MORNING AND 3 AT BEDTIME   . lisinopril-hydrochlorothiazide (ZESTORETIC) 20-12.5 mg Oral Tablet Take 1 Tab by mouth Twice daily   . oxyCODONE-acetaminophen (PERCOCET) 5-325 mg Oral Tablet Take 1 Tab by mouth Every 8 hours as needed for Pain   . rOPINIRole (REQUIP) 0.25 mg Oral Tablet Take 0.25 mg by mouth Three times a day ROPINIROLE HCL   . sertraline (ZOLOFT) 100 mg Oral Tablet 1.5tab po daily   . simvastatin (ZOCOR) 20 mg Oral Tablet Take 1 Tab (20 mg total) by mouth Every evening     No Known Allergies  Past Medical History:   Diagnosis Date   . Anxiety    . CVA (cerebrovascular accident) ([facility])    . Depression    . Hypercholesterolemia    . Hypertension          Past Surgical History:   Procedure Laterality Date   . EYE SURGERY Bilateral    . HX APPENDECTOMY     . SHOULDER SURGERY Right          Family Medical History     Problem Relation (Age of Onset)    Alzheimer's/Dementia Mother, Father    Arthritis-osteo Mother, Father    Blood Clots Father    Congestive Heart Failure Father    Coronary Artery Disease Father    Diabetes Father    Heart Attack Mother, Father    High Cholesterol  Mother, Father    Hypertension Mother, Father    Migraines Mother, Father    Sleep disorders Father    Stroke Mother, Father            Social History   Substance Use Topics   . Smoking status: Never Smoker   . Smokeless tobacco: Never Used   . Alcohol use None       Review of Systems   Constitutional: Negative.    HENT: Negative.    Eyes: Negative.    Respiratory: Negative.    Cardiovascular: Negative.    Gastrointestinal: Negative.    Musculoskeletal: Positive for arthralgias, joint swelling and myalgias.   Skin: Negative.    Neurological: Negative.    Psychiatric/Behavioral: Negative.      Objective:   Vitals: BP 104/66  Pulse 82  Temp 36.3 C (97.4 F)  Resp 12  Ht 1.57 m (5' 1.8")  Wt 103 kg (227 lb)  SpO2 93%  Breastfeeding? No  BMI 41.79 kg/m2    Physical Exam  Constitutional: She is oriented to person, place, and time. She appears well-developed and well-nourished.   Obese  In pain   HENT:   Head: Normocephalic and atraumatic.   Eyes: Pupils are equal, round, and reactive to light.   Neck: Normal range of motion.   Cardiovascular: Normal rate, regular rhythm and normal heart sounds.    Pulmonary/Chest: Effort normal and breath sounds normal.   Abdominal: Soft. Bowel sounds are normal.   Musculoskeletal:   Tender to palpation below the knee, ankle pain on right and severe foot pain.    Neurological: She is alert and oriented to person, place, and time.   Skin: Skin is warm.   Psychiatric: She has a normal mood and affect. Her behavior is normal. Judgment and thought content normal.     Assessment & Plan:     ENCOUNTER DIAGNOSES     ICD-10-CM   1. Leg pain M79.606   2. Ankle pain, unspecified chronicity, unspecified laterality M25.579   3. Left foot pain M79.672     Xray tib/fib, ankle and foot  Prn pain medication    Orders Placed This Encounter   . XR FOOT RIGHT SERIES   . XR ANKLE RIGHT SERIES   . XR TIBIA-FIBULA RIGHT   . oxyCODONE-acetaminophen (PERCOCET) 5-325 mg Oral Tablet       Return in  about 1 week (around 03/24/2017).    [name], MD

## 2017-03-24 ENCOUNTER — Encounter (INDEPENDENT_AMBULATORY_CARE_PROVIDER_SITE_OTHER): Payer: 59 | Admitting: Family Medicine

## 2017-04-12 ENCOUNTER — Encounter (INDEPENDENT_AMBULATORY_CARE_PROVIDER_SITE_OTHER): Payer: Self-pay | Admitting: Family Medicine

## 2017-04-12 ENCOUNTER — Ambulatory Visit (INDEPENDENT_AMBULATORY_CARE_PROVIDER_SITE_OTHER): Payer: MEDICAID | Admitting: Family Medicine

## 2017-04-12 VITALS — BP 109/70 | HR 70 | Temp 97.9°F | Resp 16 | Ht 61.8 in | Wt 228.2 lb

## 2017-04-12 DIAGNOSIS — M25579 Pain in unspecified ankle and joints of unspecified foot: Secondary | ICD-10-CM

## 2017-04-12 DIAGNOSIS — E559 Vitamin D deficiency, unspecified: Secondary | ICD-10-CM

## 2017-04-12 MED ORDER — ERGOCALCIFEROL (VITAMIN D2) 1,250 MCG (50,000 UNIT) CAPSULE
50000.00 [IU] | ORAL_CAPSULE | ORAL | 2 refills | Status: DC
Start: 2017-04-12 — End: 2019-11-16

## 2017-04-12 NOTE — Progress Notes (Signed)
[facility]  [address]  [place] [address]  Phone: [phone]  Fax: [phone]    Encounter Date: 04/12/2017    Patient ID:  Toni Tran  EXB:[id number]    DOB: [birth year]  Age: 61 y.o. female    Subjective:     Chief Complaint   Patient presents with   . Medication Check F/U   . Blood Work     RESULTS     HPI   Seeing podiatry the last few wks and doing ok. Here for lab test results. Some pain in right lower ext.   Current Outpatient Prescriptions   Medication Sig   . amLODIPine (NORVASC) 5 mg Oral Tablet Take 5 mg by mouth Once a day AMLODIPINE BESYLATE   . aspirin 325 mg Oral Tablet Take 325 mg by mouth Once a day   . ergocalciferol, vitamin D2, (DRISDOL) 50,000 unit Oral Capsule Take 1 Cap (50,000 Units total) by mouth Every 7 days   . gabapentin (NEURONTIN) 300 mg Oral Capsule 3 IN THE MORNING AND 3 AT BEDTIME   . lisinopril-hydrochlorothiazide (ZESTORETIC) 20-12.5 mg Oral Tablet Take 1 Tab by mouth Twice daily   . oxyCODONE-acetaminophen (PERCOCET) 5-325 mg Oral Tablet Take 1 Tab by mouth Every 8 hours as needed for Pain   . rOPINIRole (REQUIP) 0.25 mg Oral Tablet Take 0.25 mg by mouth Three times a day ROPINIROLE HCL   . sertraline (ZOLOFT) 100 mg Oral Tablet 1.5tab po daily   . simvastatin (ZOCOR) 20 mg Oral Tablet Take 1 Tab (20 mg total) by mouth Every evening     No Known Allergies  Past Medical History:   Diagnosis Date   . Anxiety    . CVA (cerebrovascular accident) (HCC)    . Depression    . Hypercholesterolemia    . Hypertension          Past Surgical History:   Procedure Laterality Date   . EYE SURGERY Bilateral    . HX APPENDECTOMY     . SHOULDER SURGERY Right          Family Medical History     Problem Relation (Age of Onset)    Alzheimer's/Dementia Mother, Father    Arthritis-osteo Mother, Father    Blood Clots Father    Congestive Heart Failure Father    Coronary Artery Disease Father    Diabetes Father    Heart Attack Mother, Father     High Cholesterol Mother, Father    Hypertension Mother, Father    Migraines Mother, Father    Sleep disorders Father    Stroke Mother, Father            Social History   Substance Use Topics   . Smoking status: Never Smoker   . Smokeless tobacco: Never Used   . Alcohol use None       Review of Systems   Constitutional: Negative.    HENT: Negative.    Eyes: Negative.    Respiratory: Negative.    Cardiovascular: Negative.    Gastrointestinal: Negative.    Musculoskeletal: Positive for arthralgias and myalgias.   Skin: Negative.    Neurological: Negative.    Psychiatric/Behavioral: Negative.      Objective:   Vitals: BP 109/70  Pulse 70  Temp 36.6 C (97.9 F)  Resp 16  Ht 1.57 m (5' 1.8")  Wt 103.5 kg (228 lb 3.2 oz)  SpO2 95%  Breastfeeding? No  BMI 42.01 kg/m2  Physical Exam   Constitutional: She is oriented to person, place, and time. She appears well-developed and well-nourished.   HENT:   Head: Normocephalic and atraumatic.   Eyes: Pupils are equal, round, and reactive to light.   Neck: Normal range of motion.   Cardiovascular: Normal rate, regular rhythm and normal heart sounds.    Pulmonary/Chest: Effort normal and breath sounds normal.   Abdominal: Soft. Bowel sounds are normal.   Musculoskeletal:   Right ankle in a boot   Neurological: She is alert and oriented to person, place, and time.   Skin: Skin is warm.   Psychiatric: She has a normal mood and affect. Her behavior is normal. Judgment and thought content normal.     Assessment & Plan:     ENCOUNTER DIAGNOSES     ICD-10-CM   1. Vitamin D deficiency E55.9   2. Ankle pain, unspecified chronicity, unspecified laterality M25.579     Refill vitamin d  Follow up w/ podiatry  ANS testing  Orders Placed This Encounter   . ergocalciferol, vitamin D2, (DRISDOL) 50,000 unit Oral Capsule           [name], MD

## 2017-05-12 ENCOUNTER — Other Ambulatory Visit: Payer: Self-pay | Admitting: Family Medicine

## 2017-05-12 ENCOUNTER — Other Ambulatory Visit (HOSPITAL_COMMUNITY): Payer: Self-pay | Admitting: Foot & Ankle Surgery

## 2017-05-12 ENCOUNTER — Other Ambulatory Visit (HOSPITAL_BASED_OUTPATIENT_CLINIC_OR_DEPARTMENT_OTHER): Payer: Self-pay | Admitting: Foot & Ankle Surgery

## 2017-05-12 DIAGNOSIS — G5751 Tarsal tunnel syndrome, right lower limb: Secondary | ICD-10-CM

## 2017-05-12 DIAGNOSIS — M25571 Pain in right ankle and joints of right foot: Secondary | ICD-10-CM

## 2017-05-12 DIAGNOSIS — I10 Essential (primary) hypertension: Secondary | ICD-10-CM

## 2017-05-12 NOTE — Telephone Encounter (Signed)
Previously followed by Fredia Sorrow FNP. Has not been seen since 09/2016.  Refilled Amlodipine 5mg  daily #90 for 3 month supply, no refills added, needs to follow-up with new provider at our office to continue medication with refills.  Nobie Putnam, [facility] 05/12/2017, 12:58 PM

## 2017-05-13 ENCOUNTER — Other Ambulatory Visit: Payer: Self-pay

## 2017-05-13 DIAGNOSIS — G2581 Restless legs syndrome: Secondary | ICD-10-CM

## 2017-05-13 MED ORDER — ROPINIROLE HCL 0.25 MG PO TABS: ORAL_TABLET | ORAL | 0 refills | Status: AC

## 2017-05-18 ENCOUNTER — Ambulatory Visit (HOSPITAL_BASED_OUTPATIENT_CLINIC_OR_DEPARTMENT_OTHER)
Admission: RE | Admit: 2017-05-18 | Discharge: 2017-05-18 | Disposition: A | Discharge status: Home / Self Care | Payer: Medicare Other | Source: Ambulatory Visit | Attending: Foot & Ankle Surgery | Admitting: Foot & Ankle Surgery

## 2017-05-18 ENCOUNTER — Encounter (HOSPITAL_BASED_OUTPATIENT_CLINIC_OR_DEPARTMENT_OTHER): Payer: Self-pay

## 2017-05-18 DIAGNOSIS — Z01812 Encounter for preprocedural laboratory examination: Secondary | ICD-10-CM | POA: Insufficient documentation

## 2017-05-18 DIAGNOSIS — Z8673 Personal history of transient ischemic attack (TIA), and cerebral infarction without residual deficits: Secondary | ICD-10-CM | POA: Insufficient documentation

## 2017-05-18 DIAGNOSIS — I639 Cerebral infarction, unspecified: Secondary | ICD-10-CM

## 2017-05-18 DIAGNOSIS — I10 Essential (primary) hypertension: Secondary | ICD-10-CM | POA: Insufficient documentation

## 2017-05-18 DIAGNOSIS — F329 Major depressive disorder, single episode, unspecified: Secondary | ICD-10-CM | POA: Insufficient documentation

## 2017-05-18 DIAGNOSIS — G5751 Tarsal tunnel syndrome, right lower limb: Secondary | ICD-10-CM

## 2017-05-18 DIAGNOSIS — R9431 Abnormal electrocardiogram [ECG] [EKG]: Secondary | ICD-10-CM | POA: Insufficient documentation

## 2017-05-18 DIAGNOSIS — E669 Obesity, unspecified: Secondary | ICD-10-CM

## 2017-05-18 DIAGNOSIS — Z6841 Body Mass Index (BMI) 40.0 and over, adult: Secondary | ICD-10-CM | POA: Insufficient documentation

## 2017-05-18 DIAGNOSIS — Z01810 Encounter for preprocedural cardiovascular examination: Secondary | ICD-10-CM | POA: Insufficient documentation

## 2017-05-18 DIAGNOSIS — E785 Hyperlipidemia, unspecified: Secondary | ICD-10-CM | POA: Insufficient documentation

## 2017-05-18 DIAGNOSIS — F32A Depression, unspecified: Secondary | ICD-10-CM

## 2017-05-18 DIAGNOSIS — F419 Anxiety disorder, unspecified: Secondary | ICD-10-CM

## 2017-05-18 DIAGNOSIS — I4510 Unspecified right bundle-branch block: Secondary | ICD-10-CM | POA: Insufficient documentation

## 2017-05-18 DIAGNOSIS — Z01818 Encounter for other preprocedural examination: Secondary | ICD-10-CM

## 2017-05-18 DIAGNOSIS — E559 Vitamin D deficiency, unspecified: Secondary | ICD-10-CM | POA: Insufficient documentation

## 2017-05-18 DIAGNOSIS — M722 Plantar fascial fibromatosis: Secondary | ICD-10-CM | POA: Insufficient documentation

## 2017-05-18 HISTORY — DX: Edema, unspecified: R60.9

## 2017-05-18 HISTORY — DX: Other chronic pain: G89.29

## 2017-05-18 HISTORY — DX: Plantar fascial fibromatosis: M72.2

## 2017-05-18 HISTORY — DX: Vitamin D deficiency, unspecified: E55.9

## 2017-05-18 HISTORY — DX: Long term (current) use of anticoagulants: Z79.01

## 2017-05-18 HISTORY — DX: Restless legs syndrome: G25.81

## 2017-05-18 HISTORY — DX: Other specified postprocedural states: Z98.890

## 2017-05-18 HISTORY — DX: Presence of spectacles and contact lenses: Z97.3

## 2017-05-18 HISTORY — DX: Tarsal tunnel syndrome, right lower limb: G57.51

## 2017-05-18 HISTORY — DX: Obesity, unspecified: E66.9

## 2017-05-18 HISTORY — DX: Nausea with vomiting, unspecified: R11.2

## 2017-05-18 HISTORY — DX: Hyperlipidemia, unspecified: E78.5

## 2017-05-18 HISTORY — DX: Localized edema: R60.0

## 2017-05-18 HISTORY — DX: Snoring: R06.83

## 2017-05-18 LAB — ECG 12-LEAD
Atrial Rate: 67 {beats}/min
Calculated P Axis: 33 degrees
Calculated R Axis: -18 degrees
Calculated T Axis: 12 degrees
PR Interval: 164 ms
QRS Duration: 130 ms
QT Interval: 452 ms
QTC Calculation: 477 ms
Ventricular rate: 67 {beats}/min

## 2017-05-18 LAB — BASIC METABOLIC PANEL
ANION GAP: 11 mmol/L (ref 3–11)
BUN/CREA RATIO: 17 (ref 6–22)
BUN/CREA RATIO: 17 (ref 6–22)
BUN: 14 mg/dL (ref 6–20)
CALCIUM: 9.4 mg/dL (ref 8.8–10.2)
CHLORIDE: 102 mmol/L (ref 101–111)
CO2 TOTAL: 25 mmol/L (ref 22–32)
CREATININE: 0.83 mg/dL (ref 0.44–1.00)
ESTIMATED GFR: >60 mL/min/1.73mˆ2 (ref >60)
GLUCOSE: 114 mg/dL — ABNORMAL HIGH (ref 70–110)
POTASSIUM: 4.2 mmol/L (ref 3.4–5.1)
SODIUM: 138 mmol/L (ref 136–145)

## 2017-05-18 NOTE — Discharge Instructions (Signed)
Nothing to eat or drink after midnight the day of surgery except a sip of water with Amlodipine, Sertraline, Lisinopril and Gabapentin

## 2017-05-18 NOTE — Ancillary Notes (Signed)
Faxed EKG to PCP Dr. Thelma BargeNgozi Ude-Oshiyoye.

## 2017-05-18 NOTE — Nurses Notes (Signed)
PST-Patient received blue folder and education regarding surgical stay from the physicians office. Questions and concerns addressed.

## 2017-05-18 NOTE — Nurses Notes (Signed)
Labs and EKG faxed to Dr. Theophilus BonesUde per Dr. Renette ButtersMenon's request/

## 2017-05-21 ENCOUNTER — Ambulatory Visit (HOSPITAL_BASED_OUTPATIENT_CLINIC_OR_DEPARTMENT_OTHER)
Admission: RE | Admit: 2017-05-21 | Discharge: 2017-05-21 | Disposition: A | Discharge status: Home / Self Care | Payer: Medicare Other | Source: Ambulatory Visit | Attending: Foot & Ankle Surgery | Admitting: Foot & Ankle Surgery

## 2017-05-21 DIAGNOSIS — S86011A Strain of right Achilles tendon, initial encounter: Secondary | ICD-10-CM | POA: Insufficient documentation

## 2017-05-21 DIAGNOSIS — M25571 Pain in right ankle and joints of right foot: Secondary | ICD-10-CM | POA: Insufficient documentation

## 2017-05-21 DIAGNOSIS — M19071 Primary osteoarthritis, right ankle and foot: Secondary | ICD-10-CM | POA: Insufficient documentation

## 2017-05-21 DIAGNOSIS — G5751 Tarsal tunnel syndrome, right lower limb: Secondary | ICD-10-CM | POA: Insufficient documentation

## 2017-05-21 MED ORDER — GADOTERIDOL 279.3 MG/ML INTRAVENOUS SOLUTION
20.00 mL | INTRAVENOUS | Status: AC
Start: 2017-05-21 — End: 2017-05-21
  Administered 2017-05-21: 20 mL via INTRAVENOUS
  Filled 2017-05-21: qty 20

## 2017-05-23 ENCOUNTER — Ambulatory Visit (INDEPENDENT_AMBULATORY_CARE_PROVIDER_SITE_OTHER): Payer: MEDICAID | Admitting: Family Medicine

## 2017-05-23 ENCOUNTER — Encounter (INDEPENDENT_AMBULATORY_CARE_PROVIDER_SITE_OTHER): Payer: Self-pay | Admitting: Family Medicine

## 2017-05-23 ENCOUNTER — Encounter (INDEPENDENT_AMBULATORY_CARE_PROVIDER_SITE_OTHER): Payer: MEDICAID | Admitting: Family Medicine

## 2017-05-23 VITALS — BP 131/89 | HR 90 | Temp 97.7°F | Resp 12 | Ht 61.8 in | Wt 228.4 lb

## 2017-05-23 DIAGNOSIS — Z01818 Encounter for other preprocedural examination: Secondary | ICD-10-CM

## 2017-05-23 NOTE — Progress Notes (Signed)
[facility]  [address]  [place] [address]  Phone: [phone]  Fax: [phone]    Encounter Date: 05/23/2017    Patient ID:  Toni Tran  [id number]:[id number]    DOB: 1956/08/29  Age: 61 y.o. female    Subjective:     Chief Complaint   Patient presents with   . Pre-op     HPI   60y/o obese female w/ h/o HTN, HLD here for preop clearance for plantar fasciitia surgery on right foot. No chest pain, or sob. Had EKG and blood work done. No h/o smoking.   Current Outpatient Prescriptions   Medication Sig   . amLODIPine (NORVASC) 5 mg Oral Tablet Take 5 mg by mouth Once a day AMLODIPINE BESYLATE   . aspirin 325 mg Oral Tablet Take 325 mg by mouth Once a day   . ergocalciferol, vitamin D2, (DRISDOL) 50,000 unit Oral Capsule Take 1 Cap (50,000 Units total) by mouth Every 7 days (Patient not taking: Reported on 05/18/2017)   . gabapentin (NEURONTIN) 300 mg Oral Capsule 3 IN THE MORNING AND 3 AT BEDTIME   . lisinopril-hydrochlorothiazide (ZESTORETIC) 20-12.5 mg Oral Tablet Take 1 Tab by mouth Twice daily   . oxyCODONE-acetaminophen (PERCOCET) 5-325 mg Oral Tablet Take 1 Tab by mouth Every 8 hours as needed for Pain (Patient not taking: Reported on 05/18/2017)   . rOPINIRole (REQUIP) 0.25 mg Oral Tablet Take 0.25 mg by mouth Per instructions Taking 2 tablets nightly   . sertraline (ZOLOFT) 100 mg Oral Tablet 1.5tab po daily (Patient taking differently: 100 mg Once a day 1.5tab po daily)   . simvastatin (ZOCOR) 20 mg Oral Tablet Take 1 Tab (20 mg total) by mouth Every evening     No Known Allergies  Past Medical History:   Diagnosis Date   . Anxiety    . 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 daily   . Chronic pain     right foot/right leg   . 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 Depression    . Hypercholesterolemia    . Hyperlipidemia    . Hypertension    . Obesity    .  Peripheral edema     right foot   . Plantar fascial fibromatosis of right foot    . PONV (postoperative nausea and vomiting)     requests Anti Emetic   . RLS (restless legs syndrome)    . Snores     Never has had sleep study   . Tarsal tunnel syndrome, right    . Vitamin D deficiency    . Wears glasses          Past Surgical History:   Procedure Laterality Date   . HX APPENDECTOMY  childhood   . HX EYE SURGERY Bilateral Childhood    Muscle Surgery   . HX SHOULDER SURGERY Right 06/2016    Exc. Bone Spur, Repair Rotator Cuff Tear   . HX TUBAL LIGATION  1992         Family Medical History     Problem Relation (Age of Onset)    Alzheimer's/Dementia Mother, Father    Arthritis-osteo Mother, Father    Blood Clots Father    Congestive Heart Failure Father    Coronary Artery Disease Father    Diabetes Father    Heart Attack  Mother, Father    High Cholesterol Mother, Father    Hypertension Mother, Father    Migraines Mother, Father    Sleep disorders Father    Stroke Mother, Father            Social History   Substance Use Topics   . Smoking status: Never Smoker   . Smokeless tobacco: Never Used   . Alcohol use No       Review of Systems   Constitutional: Negative.    HENT: Negative.    Eyes: Negative.    Respiratory: Negative.    Cardiovascular: Negative.    Gastrointestinal: Negative.    Musculoskeletal: Positive for myalgias.   Skin: Negative.    Neurological: Negative.    Psychiatric/Behavioral: Negative.      Objective:   Vitals: BP 131/89  Pulse 90  Temp 36.5 C (97.7 F)  Resp 12  Ht 1.57 m (5' 1.8")  Wt 103.6 kg (228 lb 6.4 oz)  SpO2 96%  Breastfeeding? No  BMI 42.05 kg/m2    Physical Exam   Constitutional: She is oriented to person, place, and time. She appears well-developed and well-nourished.   Obese  Fatigue appearing   HENT:   Head: Normocephalic and atraumatic.   Eyes: Pupils are equal, round, and reactive to light.   Neck: Normal range of motion.   Cardiovascular: Normal rate, regular rhythm and  normal heart sounds.    Pulmonary/Chest: Effort normal and breath sounds normal.   Abdominal: Soft. Bowel sounds are normal.   Neurological: She is alert and oriented to person, place, and time.   Skin: Skin is warm.   Psychiatric: She has a normal mood and affect. Her behavior is normal. Judgment and thought content normal.     Assessment & Plan:     ENCOUNTER DIAGNOSES     ICD-10-CM   1. Preoperative clearance Z01.818     Medically cleared for surgery  No orders of the defined types were placed in this encounter.          Thelma BargeNgozi Ude-Oshiyoye, MD

## 2017-06-06 MED ORDER — CEFAZOLIN 1 GRAM/50 ML IN DEXTROSE (ISO-OSMOTIC) INTRAVENOUS DUPLEX
1.0000 g | INJECTION | Freq: Once | INTRAVENOUS | Status: AC
Start: 2017-06-07 — End: 2017-06-07
  Administered 2017-06-07: 1 g via INTRAVENOUS
  Filled 2017-06-06: qty 50

## 2017-06-07 ENCOUNTER — Ambulatory Visit (HOSPITAL_BASED_OUTPATIENT_CLINIC_OR_DEPARTMENT_OTHER): Payer: Medicare Other | Admitting: Certified Registered"

## 2017-06-07 ENCOUNTER — Encounter (HOSPITAL_BASED_OUTPATIENT_CLINIC_OR_DEPARTMENT_OTHER)
Admission: RE | Disposition: A | Discharge status: Home / Self Care | Payer: Self-pay | Source: Ambulatory Visit | Attending: Foot & Ankle Surgery

## 2017-06-07 ENCOUNTER — Ambulatory Visit (HOSPITAL_BASED_OUTPATIENT_CLINIC_OR_DEPARTMENT_OTHER): Payer: Medicare Other | Admitting: Pain Medicine

## 2017-06-07 ENCOUNTER — Inpatient Hospital Stay (HOSPITAL_BASED_OUTPATIENT_CLINIC_OR_DEPARTMENT_OTHER)
Admission: RE | Admit: 2017-06-07 | Discharge: 2017-06-07 | Disposition: A | Discharge status: Home / Self Care | Payer: Medicare Other | Source: Ambulatory Visit | Attending: Foot & Ankle Surgery | Admitting: Foot & Ankle Surgery

## 2017-06-07 ENCOUNTER — Encounter (HOSPITAL_BASED_OUTPATIENT_CLINIC_OR_DEPARTMENT_OTHER): Payer: Self-pay | Admitting: Anesthesiology

## 2017-06-07 DIAGNOSIS — E559 Vitamin D deficiency, unspecified: Secondary | ICD-10-CM | POA: Insufficient documentation

## 2017-06-07 DIAGNOSIS — Z833 Family history of diabetes mellitus: Secondary | ICD-10-CM | POA: Insufficient documentation

## 2017-06-07 DIAGNOSIS — Z8249 Family history of ischemic heart disease and other diseases of the circulatory system: Secondary | ICD-10-CM | POA: Insufficient documentation

## 2017-06-07 DIAGNOSIS — Z9889 Other specified postprocedural states: Secondary | ICD-10-CM | POA: Insufficient documentation

## 2017-06-07 DIAGNOSIS — Z79899 Other long term (current) drug therapy: Secondary | ICD-10-CM | POA: Insufficient documentation

## 2017-06-07 DIAGNOSIS — F329 Major depressive disorder, single episode, unspecified: Secondary | ICD-10-CM | POA: Insufficient documentation

## 2017-06-07 DIAGNOSIS — I69398 Other sequelae of cerebral infarction: Secondary | ICD-10-CM | POA: Insufficient documentation

## 2017-06-07 DIAGNOSIS — M7661 Achilles tendinitis, right leg: Secondary | ICD-10-CM | POA: Insufficient documentation

## 2017-06-07 DIAGNOSIS — I69351 Hemiplegia and hemiparesis following cerebral infarction affecting right dominant side: Secondary | ICD-10-CM | POA: Insufficient documentation

## 2017-06-07 DIAGNOSIS — G5751 Tarsal tunnel syndrome, right lower limb: Secondary | ICD-10-CM | POA: Insufficient documentation

## 2017-06-07 DIAGNOSIS — M722 Plantar fascial fibromatosis: Secondary | ICD-10-CM | POA: Diagnosis present

## 2017-06-07 DIAGNOSIS — Z9851 Tubal ligation status: Secondary | ICD-10-CM | POA: Insufficient documentation

## 2017-06-07 DIAGNOSIS — I10 Essential (primary) hypertension: Secondary | ICD-10-CM | POA: Insufficient documentation

## 2017-06-07 DIAGNOSIS — F419 Anxiety disorder, unspecified: Secondary | ICD-10-CM | POA: Insufficient documentation

## 2017-06-07 DIAGNOSIS — Z7982 Long term (current) use of aspirin: Secondary | ICD-10-CM | POA: Insufficient documentation

## 2017-06-07 DIAGNOSIS — S93421A Sprain of deltoid ligament of right ankle, initial encounter: Secondary | ICD-10-CM | POA: Insufficient documentation

## 2017-06-07 DIAGNOSIS — G2581 Restless legs syndrome: Secondary | ICD-10-CM | POA: Insufficient documentation

## 2017-06-07 DIAGNOSIS — E785 Hyperlipidemia, unspecified: Secondary | ICD-10-CM | POA: Insufficient documentation

## 2017-06-07 DIAGNOSIS — Z9049 Acquired absence of other specified parts of digestive tract: Secondary | ICD-10-CM | POA: Insufficient documentation

## 2017-06-07 DIAGNOSIS — E669 Obesity, unspecified: Secondary | ICD-10-CM | POA: Insufficient documentation

## 2017-06-07 DIAGNOSIS — Z6841 Body Mass Index (BMI) 40.0 and over, adult: Secondary | ICD-10-CM | POA: Insufficient documentation

## 2017-06-07 HISTORY — PX: HX FOOT SURGERY: 2100001154

## 2017-06-07 SURGERY — RELEASE TARSAL TUNNEL
Anesthesia: General/Block | Site: Foot | Laterality: Right | Wound class: Clean Wound: Uninfected operative wounds in which no inflammation occurred

## 2017-06-07 MED ORDER — HYDROMORPHONE 0.5 MG/0.5 ML INJECTION SYRINGE
0.4000 mg | INJECTION | INTRAMUSCULAR | Status: DC | PRN
Start: 2017-06-07 — End: 2017-06-07
  Administered 2017-06-07: 0.5 mg via INTRAVENOUS
  Filled 2017-06-07: qty 1

## 2017-06-07 MED ORDER — BUPIVACAINE-EPINEPHRINE (PF) 0.25 %-1:200,000 INJECTION SOLUTION
20.0000 mL | Freq: Once | INTRAMUSCULAR | Status: AC
Start: 2017-06-07 — End: 2017-06-07
  Administered 2017-06-07 (×2): 20 mL via INTRAMUSCULAR
  Filled 2017-06-07: qty 30

## 2017-06-07 MED ORDER — THROMBIN (BOVINE) 5,000 UNIT TOPICAL SOLUTION
5000.0000 [IU] | Freq: Once | CUTANEOUS | Status: AC
Start: 2017-06-07 — End: 2017-06-07
  Administered 2017-06-07: 5000 [IU] via TOPICAL
  Filled 2017-06-07: qty 1

## 2017-06-07 MED ORDER — LIDOCAINE (PF) 100 MG/5 ML (2 %) INTRAVENOUS SYRINGE
INJECTION | Freq: Once | INTRAVENOUS | Status: DC | PRN
Start: 2017-06-07 — End: 2017-06-07
  Administered 2017-06-07: 50 mg via INTRAVENOUS

## 2017-06-07 MED ORDER — APREPITANT 40 MG CAPSULE
40.00 mg | ORAL_CAPSULE | Freq: Once | ORAL | Status: AC
Start: 2017-06-07 — End: 2017-06-07
  Filled 2017-06-07: qty 1

## 2017-06-07 MED ORDER — ROCURONIUM 10 MG/ML INTRAVENOUS SOLUTION
Freq: Once | INTRAVENOUS | Status: DC | PRN
Start: 2017-06-07 — End: 2017-06-07
  Administered 2017-06-07: 5 mg via INTRAVENOUS
  Administered 2017-06-07: 40 mg via INTRAVENOUS

## 2017-06-07 MED ORDER — MIDAZOLAM 1 MG/ML INJECTION SOLUTION
Freq: Once | INTRAMUSCULAR | Status: DC | PRN
Start: 2017-06-07 — End: 2017-06-07
  Administered 2017-06-07: 2 mg via INTRAVENOUS

## 2017-06-07 MED ORDER — ACETAMINOPHEN 1,000 MG/100 ML (10 MG/ML) INTRAVENOUS SOLUTION
1000.0000 mg | Freq: Once | INTRAVENOUS | Status: AC | PRN
Start: 2017-06-07 — End: 2017-06-07
  Administered 2017-06-07: 1000 mg via INTRAVENOUS
  Filled 2017-06-07: qty 100

## 2017-06-07 MED ORDER — ONDANSETRON HCL (PF) 4 MG/2 ML INJECTION SOLUTION
4.0000 mg | Freq: Once | INTRAMUSCULAR | Status: DC | PRN
Start: 2017-06-07 — End: 2017-06-07
  Administered 2017-06-07: 4 mg via INTRAVENOUS
  Filled 2017-06-07: qty 2

## 2017-06-07 MED ORDER — SUCCINYLCHOLINE CHLORIDE 20 MG/ML INJECTION SOLUTION
Freq: Once | INTRAMUSCULAR | Status: DC | PRN
Start: 2017-06-07 — End: 2017-06-07
  Administered 2017-06-07: 100 mg via INTRAVENOUS

## 2017-06-07 MED ORDER — SCOPOLAMINE 1 MG OVER 3 DAYS TRANSDERMAL PATCH
1.00 | MEDICATED_PATCH | TRANSDERMAL | Status: DC
Start: 2017-06-07 — End: 2017-06-07
  Administered 2017-06-07: 1 via TRANSDERMAL
  Filled 2017-06-07: qty 1

## 2017-06-07 MED ORDER — ONDANSETRON HCL (PF) 4 MG/2 ML INJECTION SOLUTION
Freq: Once | INTRAMUSCULAR | Status: DC | PRN
Start: 2017-06-07 — End: 2017-06-07
  Administered 2017-06-07: 4 mg via INTRAVENOUS

## 2017-06-07 MED ORDER — PROPOFOL 10 MG/ML IV BOLUS
INJECTION | Freq: Once | INTRAVENOUS | Status: DC | PRN
Start: 2017-06-07 — End: 2017-06-07
  Administered 2017-06-07: 200 mg via INTRAVENOUS

## 2017-06-07 MED ORDER — SODIUM CHLORIDE 0.9 % IRRIGATION SOLUTION
50000.0000 [IU] | Freq: Once | Status: AC
Start: 2017-06-07 — End: 2017-06-07
  Administered 2017-06-07 (×2): 50000 [IU]

## 2017-06-07 MED ORDER — GLYCOPYRROLATE 0.2 MG/ML INJECTION SOLUTION
Freq: Once | INTRAMUSCULAR | Status: DC | PRN
Start: 2017-06-07 — End: 2017-06-07
  Administered 2017-06-07: 0.2 mg via INTRAVENOUS

## 2017-06-07 MED ORDER — EPHEDRINE SULFATE 50 MG/ML INJECTION SOLUTION
Freq: Once | INTRAMUSCULAR | Status: DC | PRN
Start: 2017-06-07 — End: 2017-06-07
  Administered 2017-06-07 (×2): 12.5 mg via INTRAVENOUS

## 2017-06-07 MED ORDER — FENTANYL (PF) 50 MCG/ML INJECTION SOLUTION
Freq: Once | INTRAMUSCULAR | Status: DC | PRN
Start: 2017-06-07 — End: 2017-06-07
  Administered 2017-06-07: 100 ug via INTRAVENOUS
  Administered 2017-06-07: 50 ug via INTRAVENOUS
  Administered 2017-06-07 (×2): 25 ug via INTRAVENOUS

## 2017-06-07 MED ORDER — DEXAMETHASONE SODIUM PHOSPHATE (PF) 10 MG/ML INJECTION SOLUTION -OPHTHALMIC USE ONLY
INTRAMUSCULAR | Status: DC
Start: 2017-06-07 — End: 2017-06-07
  Filled 2017-06-07: qty 1

## 2017-06-07 MED ORDER — BACITRACIN 50,000 UNIT INTRAMUSCULAR SOLUTION
INTRAMUSCULAR | Status: DC
Start: 2017-06-07 — End: 2017-06-07
  Filled 2017-06-07: qty 10

## 2017-06-07 MED ORDER — DEXAMETHASONE SODIUM PHOSPHATE 4 MG/ML INJECTION SOLUTION
Freq: Once | INTRAMUSCULAR | Status: DC | PRN
Start: 2017-06-07 — End: 2017-06-07
  Administered 2017-06-07 (×2): 4 mg via INTRAVENOUS

## 2017-06-07 MED ORDER — ROPIVACAINE (PF) 5 MG/ML (0.5 %) INJECTION SOLUTION
INTRAMUSCULAR | Status: AC
Start: 2017-06-07 — End: 2017-06-07
  Filled 2017-06-07: qty 1

## 2017-06-07 MED ORDER — LACTATED RINGERS INTRAVENOUS SOLUTION
INTRAVENOUS | Status: DC
Start: 2017-06-07 — End: 2017-06-07

## 2017-06-07 MED ORDER — ROPIVACAINE (PF) 5 MG/ML (0.5 %) INJECTION SOLUTION
Freq: Once | INTRAMUSCULAR | Status: DC | PRN
Start: 2017-06-07 — End: 2017-06-07
  Administered 2017-06-07: 30 mL

## 2017-06-07 MED ORDER — PROCHLORPERAZINE EDISYLATE 10 MG/2 ML (5 MG/ML) INJECTION SOLUTION
10.0000 mg | Freq: Once | INTRAMUSCULAR | Status: DC | PRN
Start: 2017-06-07 — End: 2017-06-07
  Administered 2017-06-07: 10 mg via INTRAVENOUS
  Filled 2017-06-07: qty 2

## 2017-06-07 MED ORDER — LIDOCAINE HCL 20 MG/ML (2 %) INJECTION SOLUTION
INTRAMUSCULAR | Status: DC
Start: 2017-06-07 — End: 2017-06-07
  Filled 2017-06-07: qty 10

## 2017-06-07 MED ORDER — DEXAMETHASONE SODIUM PHOSPHATE 4 MG/ML INJECTION SOLUTION
4.0000 mg | Freq: Once | INTRAMUSCULAR | Status: AC
Start: 2017-06-07 — End: 2017-06-07
  Administered 2017-06-07: 4 mg via INTRAMUSCULAR
  Filled 2017-06-07: qty 1

## 2017-06-07 MED ORDER — OXYCODONE-ACETAMINOPHEN 5 MG-325 MG TABLET
1.0000 | ORAL_TABLET | ORAL | Status: DC | PRN
Start: 2017-06-07 — End: 2017-06-07

## 2017-06-07 MED ADMIN — sodium chloride 0.9 % intravenous solution: INTRAVENOUS | @ 13:00:00 | NDC 00338004904

## 2017-06-07 MED ADMIN — fentaNYL (PF) 50 mcg/mL injection solution: INTRAVENOUS | @ 15:00:00

## 2017-06-07 MED ADMIN — DEXTROSE 5% IN WATER W/ ADDITIVES: INTRAVENOUS | @ 16:00:00 | NDC 00338001748

## 2017-06-07 MED ADMIN — lactated Ringers intravenous solution: INTRAVENOUS | @ 15:00:00 | NDC 00338011704

## 2017-06-07 MED ADMIN — sodium chloride 0.9 % intravenous solution: INTRAVENOUS | @ 16:00:00 | NDC 00338004904

## 2017-06-07 MED ADMIN — sucralfate 1 gram tablet: INTRAVENOUS | @ 13:00:00

## 2017-06-07 SURGICAL SUPPLY — 43 items
BANDAGE COFLX EASYTEAR CELLULA_RFOAM 5YDX3IN CHSV LTWT QKSTK (ORTHOPEDICS (NOT IMPLANTS)) ×1
BANDAGE KERLIX 4.1YDX4.5IN 6 PLY ABS CRNKL WV LINT FREE COTTON LRG GAUZE STRL LF  DISP (WOUND CARE SUPPLY) ×1 IMPLANT
BANDAGE KERLIX 4.1YDX4.5IN 6 P_LY ABS SFT PCH LINT FR COT LRG (WOUND CARE/ENTEROSTOMAL SUPPLY) ×1
BLADE 10 BD RB-BCK CBNSTL SURG TISS STRL LF  DISP (SURGICAL CUTTING SUPPLIES) ×2 IMPLANT
BLADE 10 BD RB-BCK SHRP CBNSTL_SURG TISS STRL LF (CUTTING ELEMENTS) ×2
BLADE 15 BD RB-BCK CBNSTL SURG TISS STRL LF  DISP (SURGICAL CUTTING SUPPLIES) ×3 IMPLANT
BLADE 15 BD RB-BCK CBNSTL SURG_STRL LF (CUTTING ELEMENTS) ×3
CONV USE 405185 - CUFF TOURNIQUET 18X4IN ATS CYL 2 PORT 1 BLADDER SLEEVE STRL LF  DISP (ORTHOPEDICS (NOT IMPLANTS)) ×1 IMPLANT
CONV USE 405188 - CUFF TOURNIQUET BRN 34X4IN ATS 3K CYL 2 PORT 1 BLADDER POS LOCK CONN SLEEVE STRL LF  DISP (ORTHOPEDICS (NOT IMPLANTS)) IMPLANT
CONV USE ITEM 313923 - BANDAGE COFLX EASYTEAR CELLULA_RFOAM 5YDX3IN CHSV LTWT QKSTK (ORTHOPEDICS (NOT IMPLANTS)) ×1 IMPLANT
CONV USE ITEM 338068 - KIT SURG SM STUP NONST DISP LF (CUSTOM TRAYS & PACK) ×1 IMPLANT
COVER REINF BCK LF  DISP 90X44IN TBL CNVRT STRL POLY (EQUIPMENT MINOR) ×1 IMPLANT
COVER REINF BCK LF DISP 90X44_IN TBL CNVRT STRL POLY (EQUIPMENT MINOR) ×1
CUFF TOURNIQUET 18X4IN ATS CYL 2 PORT 1 BLADDER SLEEVE STRL LF  DISP (ORTHOPEDICS (NOT IMPLANTS)) ×1
CUFF TOURNIQUET BRN 34X4IN ATS 3K CYL 2 PORT 1 BLADDER POS LOCK CONN SLEEVE STRL LF  DISP (ORTHOPEDICS (NOT IMPLANTS))
CUFF TOURNIQUET RD 18X4IN ATS_CYL 1 PORT 1 BLADDER LL CONN (ORTHOPEDICS (NOT IMPLANTS)) ×1
DISCONTINUED USE ITEM 329606 - SUTURE 4-0 PS2 ETHILON MTPS 18IN BLK MONOF NONAB (SUTURE/WOUND CLOSURE) ×1 IMPLANT
DRAPE CORD HLD TAB ABS REINF F ENESTRATE 128.5X87IN XTRMT 2.5 (PROTECTIVE PRODUCTS/GARMENTS) ×2 IMPLANT
DRESSING OIL EMUL NONADH 3X3IN 6112 STRL CURITY LF 50/BX (WOUND CARE SUPPLY) ×1 IMPLANT
DRESSING OIL EMUL NONADH 3X3IN 6112 STRL CURITY LF 50/BX (WOUND CARE/ENTEROSTOMAL SUPPLY) ×1
GOWN SURG XL AAMI L4 IMPRV REI NF BRTHBL STRL LF DISP CNVRT (PROTECTIVE PRODUCTS/GARMENTS) ×4 IMPLANT
KIT SURG SM STUP NONST DISP LF (CUSTOM TRAYS & PACK) ×1
NEEDLE HYPO  18GA 1.5IN REG WL SFGLD POLYPROP REG BVL LL SHIELD MECH DEHP-FR IM PNK STRL LF  DISP (NEEDLES & SYRINGE SUPPLIES) ×2 IMPLANT
NEEDLE HYPO 18GA 1.5IN REG WL_SFGLD POLYPROP LL HUB SHIELD (NEEDLES & SYRINGE SUPPLIES) ×2
PACK SM SET UP_DYNJ906635 (CUSTOM TRAYS & PACK) ×1
SOL IRRG 0.9% NACL 500ML PLASTIC PR BTL ISTNC N-PYRG STRL LF (SOLUTIONS) ×1 IMPLANT
SOLUTION IRRG NS 500CC 2F7123_18/CS (SOLUTIONS) ×1
SPONGE ABS 6X2CM PORCINE GELTN SRGFM THK7MM 12-7 STRL DISP (WOUND CARE SUPPLY) ×1 IMPLANT
SPONGE GAUZE STRL 4 X 4IN TUB_6939 1280/CS (WOUND CARE SUPPLY) ×1 IMPLANT
SPONGE GAUZE STRL 4 X 4IN TUB_6939 1280/CS (WOUND CARE/ENTEROSTOMAL SUPPLY) ×1
SPONGE SURGIFOAM 12-7 1972 (WOUND CARE/ENTEROSTOMAL SUPPLY) ×1
STAPLER SKIN SIGNET 35W 054006 DISP 6EA/BX (ENDOSCOPIC SUPPLIES) ×2 IMPLANT
STAPLER SKIN SIGNET 35W 054006 DISP 6EA/BX (INSTRUMENTS ENDOMECHANICAL) ×2
STKNT ORTHO 48X12IN IMPRV PUL TAB HOLLOW LF  STRL (ORTHOPEDICS (NOT IMPLANTS)) ×1 IMPLANT
STOCKINETTE IMPERVIOUS 12X48_CS/10/EA (ORTHOPEDICS (NOT IMPLANTS)) ×1
SUTURE 3-0 CT1 VICRYL 27IN VIOL BRD COAT ABS (SUTURE/WOUND CLOSURE) ×2 IMPLANT
SUTURE 3-0 CT1 VICRYL 27IN VIO_L BRD COAT ABS (SUTURE/WOUND CLOSURE) ×2
SUTURE 4-0 PS2 ETHILON MTPS 18_IN BLK MONOF NONAB (SUTURE/WOUND CLOSURE) ×1
SYRINGE LL 10ML LF  STRL GRAD N-PYRG DEHP-FR PVC FREE MED DISP (NEEDLES & SYRINGE SUPPLIES) ×2 IMPLANT
SYRINGE LL 10ML LF STRL MED D_ISP (NEEDLES & SYRINGE SUPPLIES) ×2
TOURNIQUET PORT SNGL CUFF 34IN_DISP 60707015600 (ORTHOPEDICS (NOT IMPLANTS))
TRAY SKIN SCRUB 8IN VNYL COTTON 6 WNG 6 SPONGE STICK 2 TIP APPL DRY STRL LF (KITS & TRAYS (DISPOSABLE)) ×1 IMPLANT
TRAY SURG PREP SCR CR ESTM (KITS & TRAYS (DISPOSABLE)) ×2

## 2017-06-07 NOTE — Nurses Notes (Signed)
Dr. Sharyl NimrodAcree to PACU bedside at this time, discussed procedure and follow-up with patient.

## 2017-06-07 NOTE — Brief Op Note (Signed)
Toni Tran                                               BRIEF OPERATIVE NOTE      Patient Name: Toni Tran,Toni Tran  Hospital Number: [id number]  Encounter number: [id number]  Date of Service: 06/07/2017   Date of Birth: 11/08/1956      Pre-Operative Diagnosis: RIGHT TARSAL TUNNEL SYNDROME  RIGHT PLANTAR FASCIAL FIBROMATOSES     Post-Operative Diagnosis: RIGHT TARSAL TUNNEL SYNDROME  RIGHT PLANTAR FASCIAL FIBROMATOSES    Procedure(s)/Description:  Procedure(s):  RELEASE TARSAL TUNNEL  FASCIOTOMY PLANTAR    Findings: Consistent with Dx     Attending Surgeon: Jonita AlbeeJohn Thomas Dylan Ruotolo, DPM    Assistant(s):  None    Estimated Blood Loss:  Minimal    Specimens removed  None    Complications:  None           Disposition: PACU - hemodynamically stable.           Condition: stable    Patient is at increased risk for surgical bleeding : No    Patient is on postop antibiotic regimen > 24 hours:  No

## 2017-06-07 NOTE — Discharge Instructions (Addendum)
[facility]  [place], [place] [address]     Anesthesia Discharge Instructions    1.  Do NOT drive an automobile today.    2.  Do NOT sign any legal documents for 24 hours.    3.  Do NOT operate any electrical equipment today.    Fall risk prevention education has been reviewed with the patient/significant other.

## 2017-06-07 NOTE — Anesthesia Preprocedure Evaluation (Addendum)
ANESTHESIA PRE-OP EVALUATION  Planned Procedure: RELEASE TARSAL TUNNEL (Right )  FASCIOTOMY PLANTAR (Right )  Review of Systems    PONV     no family history of anesthetic complications   patient summary reviewed          Pulmonary     Cardiovascular    ECG reviewed  Exercise Tolerance: > or = 4 METS        GI/Hepatic/Renal    no GERD and no motion sickness     Endo/Other    no chronic steroid use       Neuro/Psych/MS   CVA and residual symptoms     Cancer             Physical Assessment      Patient summary reviewed   Airway     Comment: Neck no bruits    Mallampati: IV      Neck ROM: full              Dental           (+) partials, missing, chipped, poor dentition        Comment: Upper & lower partials are not in       Pulmonary    Breath sounds clear to auscultation       Cardiovascular    Rhythm: regular         Other findings  NPO pMN    Contact lenses no  Piercings   no  Has anything changed since you spoke with the preanesthesia nurse? no  Any changes in the last 6 months that have not been documented or reported? no          Plan  Planned anesthesia type: GETA and regional block    ASA 3     Intravenous induction     Anesthetic plan and risks discussed with patient.     Anesthesia issues/risks discussed are: PONV, Nerve Injuries and Dental Injuries.        Patient's NPO status is appropriate for Anesthesia.           Plan discussed with CRNA.

## 2017-06-07 NOTE — Anesthesia Procedure Notes (Signed)
Block: Peripheral Block     Sedation        Blocks  Block Type: popliteal     Indication: at surgeon's request and pain management  Laterality: Right  Requesting Surgeon: Thera FlakeACREE, JOHN THOMAS  Site verified, H&P updated and consent obtained, Patient monitors applied, Timeout performed, Emergency drugs and equipment available, Patient positioned and anesthesia consent given  Technique(See MAR for doses)  Type of Block: single shot             Nerve stimulator used :with twitch faded at the current intensity of 0.285mA  Sterile Skin Prep : Chlorhexidine and Aseptic technique, Sterile gloves, cap, Mask, Hand hygiene performed and Sterile technique    chlorhexidine    Skin Local      Needle  Needle type: ultrasound needle     Needle Gauge: 22G   Needle length: 4 in.  Needle localization: nerve stimulator and ultrasound guidance  Number of attempts: 2      Catheter          Assessment  Injection assessment: incremental injection, local visualized surrounding nerve on ultrasound and negative aspiration for heme      Heart Rate changed: No    Events     Patient tolerance of procedure: tolerated well, no immediate complications    Anesthesia team accepts responsibility for patient's post operative care    Performed By:  Anesthesiologist: Ruben ReasonMCLELLAN, Xaria Judon

## 2017-06-07 NOTE — Nurses Notes (Signed)
Pt states that she did have a stroke which leads to right sided weakness. Also, Left pupil stays dilated.

## 2017-06-07 NOTE — Anesthesia Procedure Notes (Signed)
Block: Peripheral Block     Sedation        Blocks  Block Type: saphenous     Indication: at surgeon's request and pain management  Laterality: Right  Requesting Surgeon: Thera FlakeACREE, JOHN THOMAS  Site verified, H&P updated and consent obtained, Patient monitors applied, Timeout performed, Emergency drugs and equipment available, Patient positioned and anesthesia consent given  Technique(See MAR for doses)  Type of Block: single shot               Sterile Skin Prep : Chlorhexidine and Aseptic technique, cap, Mask, Hand hygiene performed and Sterile technique    chlorhexidine    Skin Local      Needle  Needle type: ultrasound needle     Needle Gauge: 22G   Needle length: 4 in.  Needle localization: ultrasound guidance  Number of attempts: 1      Catheter          Assessment  Injection assessment: incremental injection and negative aspiration for heme      Heart Rate changed: No    Events     Patient tolerance of procedure: tolerated well, no immediate complications    Anesthesia team accepts responsibility for patient's post operative care    Performed By:  Anesthesiologist: Ruben ReasonMCLELLAN, Raydel Hosick

## 2017-06-07 NOTE — Anesthesia Transfer of Care (Signed)
ANESTHESIA TRANSFER OF CARE   Toni Tran is a 61 y.o. ,Tran, Weight: 102.1 kg (225 lb)   had Procedure(s):  RELEASE TARSAL TUNNEL  FASCIOTOMY PLANTAR  performed  06/07/17   Primary Service: Thera FlakeJohn Thomas Acree, *    Past Medical History:   Diagnosis Date   . Anxiety    . 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 daily   . Chronic pain     right foot/right leg   . 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 Depression    . Hypercholesterolemia    . Hyperlipidemia    . Hypertension    . Obesity    . Peripheral edema     right foot   . Plantar fascial fibromatosis of right foot    . PONV (postoperative nausea and vomiting)     requests Anti Emetic   . RLS (restless legs syndrome)    . Snores     Never has had sleep study   . Tarsal tunnel syndrome, right    . Vitamin D deficiency    . Wears glasses       Allergy History as of 06/07/17      No Known Allergies              I completed my transfer of care / handoff to the receiving personnel during which we discussed:  Access, Airway, All key/critical aspects of case discussed, Analgesia, Antibiotics, Expectation of post procedure, Fluids/Product, Gave opportunity for questions and acknowledgement of understanding, Labs and PMHx    Post Location: PACU                                                                Last OR Temp: Temperature: 36.8 C (98.2 F)  ABG:  POTASSIUM   Date Value Ref Range Status   05/18/2017 4.2 3.4 - 5.1 mmol/L Final     CALCIUM   Date Value Ref Range Status   05/18/2017 9.4 8.8 - 10.2 mg/dL Final     Calculated P Axis   Date Value Ref Range Status   05/18/2017 33 degrees Final     Calculated R Axis   Date Value Ref Range Status   05/18/2017 -18 degrees Final     Calculated T Axis   Date Value Ref Range Status   05/18/2017 12 degrees Final     Airway:   Blood pressure (!) 126/59, pulse (!) 107, temperature 36.8 C (98.2 F), resp. rate 18,  height 1.549 m (5\' 1" ), weight 102.1 kg (225 lb), SpO2 93 %, not currently breastfeeding.

## 2017-06-07 NOTE — Nurses Notes (Signed)
Patient discharged home with family.  AVS reviewed with patient/care giver.  A written copy of the AVS and discharge instructions was given to the patient/care giver.  Questions sufficiently answered as needed.  Patient/care giver encouraged to follow up with PCP as indicated.  In the event of an emergency, patient/care giver instructed to call 911 or go to the nearest emergency room.

## 2017-06-07 NOTE — Care Plan (Signed)
Problem: Patient Care Overview (Adult,OB)  Goal: Plan of Care Review(Adult,OB)  The patient and/or their representative will communicate an understanding of their plan of care   Reviewed plan of care with patient. Teach back completed by pt and pt verbalizes understanding with plan of care.  No questions or concerns voiced at this time.

## 2017-06-07 NOTE — Nurses Notes (Signed)
Patient states having 8/10 anterior headache that is throbbing. Patient denies pain at operative site or any other location at this time. Discussed with Dr. Ozella RocksMcLellan, received order for Ofirmev 1gram IVPB.

## 2017-06-07 NOTE — H&P (Signed)
[facility]  [place], [place] [id number]    H&P Update Form                                                Georgiann Cockeraylor,Kaylie, 61 y.o. female  Date of Admission:  06/07/2017  Date of Birth:  08/09/1956    06/07/2017    STOP: IF H&P IS GREATER THAN 30 DAYS FROM SURGICAL DAY COMPLETE NEW H&P IS REQUIRED.    Outpatient Pre-Surgical H & P updated the day of the procedure.  1.  H&P completed within 30 days of surgical procedure by Dr Tamsen Snidershiyoye and has been reviewed by me within 24 hours of the surgery, the patient has been examined, and no change has occured in the patients condition since the H&P was completed.       Change in medications: No       Last menstrual period:  No LMP recorded. Patient is postmenopausal.      Comments:     2.  Patient continues to be appropiate candidate for planned surgical procedure. YES    Thera FlakeJohn Thomas Roxine Whittinghill, DPM  06/07/2017, 12:52

## 2017-06-07 NOTE — Nurses Notes (Signed)
Pt tolerating po fluids well. Family present at bedside

## 2017-06-08 NOTE — Anesthesia Postprocedure Evaluation (Signed)
Anesthesia Post Op Evaluation    Patient: Toni Tran  Procedure(s):  RELEASE TARSAL TUNNEL  FASCIOTOMY PLANTAR    Last Vitals:Temperature: 36.7 C (98.1 F) (06/07/17 1634)  Heart Rate: 79 (06/07/17 1653)  BP (Non-Invasive): (!) 108/46 (06/07/17 1653)  Respiratory Rate: 16 (06/07/17 1653)  SpO2-1: 95 % (06/07/17 1653)  Pain Score (Numeric, Faces): 6 (06/07/17 1653)         Anesthetic complications: no      Comments: Discharged by criteria per RN.

## 2017-06-10 ENCOUNTER — Telehealth (INDEPENDENT_AMBULATORY_CARE_PROVIDER_SITE_OTHER): Payer: Self-pay | Admitting: Family Medicine

## 2017-06-10 NOTE — Telephone Encounter (Signed)
Ropinorole and gabapentin refilled.  Sharyon CableGloria Miking Usrey, MA  06/10/2017, 11:23

## 2017-07-01 ENCOUNTER — Ambulatory Visit (INDEPENDENT_AMBULATORY_CARE_PROVIDER_SITE_OTHER): Payer: MEDICAID | Admitting: HOSPITALIST

## 2017-07-01 ENCOUNTER — Telehealth (INDEPENDENT_AMBULATORY_CARE_PROVIDER_SITE_OTHER): Payer: Self-pay | Admitting: Family Medicine

## 2017-07-01 ENCOUNTER — Ambulatory Visit: Payer: Medicare Other | Attending: HOSPITALIST

## 2017-07-01 ENCOUNTER — Encounter (INDEPENDENT_AMBULATORY_CARE_PROVIDER_SITE_OTHER): Payer: Self-pay | Admitting: HOSPITALIST

## 2017-07-01 DIAGNOSIS — R0781 Pleurodynia: Secondary | ICD-10-CM | POA: Insufficient documentation

## 2017-07-01 DIAGNOSIS — J209 Acute bronchitis, unspecified: Secondary | ICD-10-CM | POA: Insufficient documentation

## 2017-07-01 LAB — D-DIMER: D-DIMER: <200 ng/mL (ref <=230)

## 2017-07-01 MED ORDER — VENTOLIN HFA 90 MCG/ACTUATION AEROSOL INHALER: 2 | Inhaler | Freq: Four times a day (QID) | RESPIRATORY_TRACT | 0 refills | 0 days | Status: AC | PRN

## 2017-07-01 MED ORDER — BENZONATATE 100 MG CAPSULE: 100 mg | Cap | Freq: Three times a day (TID) | ORAL | 0 refills | 0 days | Status: DC

## 2017-07-01 MED ORDER — PREDNISONE 5 MG TABLET: Tab | ORAL | 0 refills | 0 days | Status: AC

## 2017-07-01 NOTE — Progress Notes (Signed)
[facility]  [address]  [place] [id number]  Phone: [phone]  Fax: [phone]    Encounter Date: 07/01/2017    Patient ID:  Toni CockerKathy Tran  MVH:[id number]    DOB: 1956/07/24  Age: 61 y.o. female    Subjective:     Chief Complaint   Patient presents with   . Spasms     chest/shoulder x5days   . Cold Symptoms     x2weeks   . Medication Refill     GABAPENTIN     HPI     Pt here for eval of pain in chest. Says she coughing with green sputum. Repots she does not smoke. Says she just had a foot surgery by Dr Sharyl NimrodAcree. Says sometimes she has bend forward to reduce the pain in her chest. Denies as smoking hx.       Current Outpatient Prescriptions   Medication Sig   . amLODIPine (NORVASC) 5 mg Oral Tablet Take 5 mg by mouth Once a day AMLODIPINE BESYLATE   . aspirin 325 mg Oral Tablet Take 325 mg by mouth Once a day   . benzonatate (TESSALON) 100 mg Oral Capsule Take 1 Cap (100 mg total) by mouth Three times a day   . ergocalciferol, vitamin D2, (DRISDOL) 50,000 unit Oral Capsule Take 1 Cap (50,000 Units total) by mouth Every 7 days   . gabapentin (NEURONTIN) 300 mg Oral Capsule 3 IN THE MORNING AND 3 AT BEDTIME   . lisinopril-hydrochlorothiazide (ZESTORETIC) 20-12.5 mg Oral Tablet Take 1 Tab by mouth Twice daily   . oxyCODONE-acetaminophen (PERCOCET) 5-325 mg Oral Tablet Take 1 Tab by mouth Every 8 hours as needed for Pain   . predniSONE (DELTASONE) 5 mg Oral Tablet Take 5 Tab (25mg ) by mouth QD x1 day. Then 2 Tab (10mg ) QD x1 day. Then 1 Tab (5mg ) QD x1 day.   Marland Kitchen. rOPINIRole (REQUIP) 0.25 mg Oral Tablet Take 0.25 mg by mouth Per instructions Taking 2 tablets nightly   . sertraline (ZOLOFT) 100 mg Oral Tablet 1.5tab po daily (Patient taking differently: 100 mg Once a day 1.5tab po daily)   . simvastatin (ZOCOR) 20 mg Oral Tablet Take 1 Tab (20 mg total) by mouth Every evening   . VENTOLIN HFA 90 mcg/actuation Inhalation HFA Aerosol Inhaler Take 2 Puffs by inhalation Every  6 hours as needed     No Known Allergies  Past Medical History:   Diagnosis Date   . Anxiety    . 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 daily   . Chronic pain     right foot/right leg   . 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 Depression    . Hypercholesterolemia    . Hyperlipidemia    . Hypertension    . Obesity    . Peripheral edema     right foot   . Plantar fascial fibromatosis of right foot    . PONV (postoperative nausea and vomiting)     requests Anti Emetic   . RLS (restless legs syndrome)    . Snores     Never has had sleep study   . Tarsal tunnel syndrome, right    . Vitamin D deficiency    . Wears glasses          Past Surgical History:   Procedure Laterality  Date   . HX APPENDECTOMY  childhood   . HX EYE SURGERY Bilateral Childhood    Muscle Surgery   . HX SHOULDER SURGERY Right 06/2016    Exc. Bone Spur, Repair Rotator Cuff Tear   . HX TUBAL LIGATION  1992         Family Medical History     Problem Relation (Age of Onset)    Alzheimer's/Dementia Mother, Father    Arthritis-osteo Mother, Father    Blood Clots Father    Congestive Heart Failure Father    Coronary Artery Disease Father    Diabetes Father    Heart Attack Mother, Father    High Cholesterol Mother, Father    Hypertension Mother, Father    Migraines Mother, Father    Sleep disorders Father    Stroke Mother, Father            Social History   Substance Use Topics   . Smoking status: Never Smoker   . Smokeless tobacco: Never Used   . Alcohol use No       Review of Systems   Constitutional: Negative for chills, fatigue, fever and unexpected weight change.   HENT: Positive for congestion.    Eyes: Negative for visual disturbance.   Respiratory: Negative for cough, choking, shortness of breath and wheezing.    Cardiovascular: Negative for chest pain and palpitations.   Gastrointestinal: Negative for nausea and vomiting.   Endocrine:  Negative for polydipsia, polyphagia and polyuria.   Genitourinary: Negative for dysuria and frequency.   Musculoskeletal: Negative for arthralgias.   Skin: Negative for rash.     Objective:   Vitals: BP 110/65  Pulse 91  Temp 36.4 C (97.6 F)  Resp 16  Ht 1.57 m (5' 1.8")  Wt 103.3 kg (227 lb 12.8 oz)  SpO2 98%  Breastfeeding? No  BMI 41.94 kg/m2    Physical Exam   Constitutional: She appears well-developed and well-nourished. No distress.   HENT:   Head: Normocephalic and atraumatic.   Mouth/Throat: No oropharyngeal exudate.   Eyes: Right eye exhibits no discharge. Left eye exhibits no discharge. No scleral icterus.   Neck: Normal range of motion. Neck supple. No thyromegaly present.   Cardiovascular: Normal rate, regular rhythm, normal heart sounds and intact distal pulses.  Exam reveals no gallop and no friction rub.    No murmur heard.  Pulmonary/Chest: Effort normal and breath sounds normal. No respiratory distress. She has no wheezes. She has no rales. She exhibits no tenderness.   Abdominal: Soft. Bowel sounds are normal. She exhibits no distension and no mass. There is no tenderness. There is no rebound and no guarding.   Musculoskeletal: Normal range of motion. She exhibits no edema, tenderness or deformity.   Neurological: She is alert.   Skin: Skin is warm and dry. She is not diaphoretic.   Psychiatric: She has a normal mood and affect.     Assessment & Plan:     ENCOUNTER DIAGNOSES     ICD-10-CM   1. Chest pain, pleuritic R07.81   2. Acute bronchitis, unspecified organism J20.9       Orders Placed This Encounter   . D-DIMER   . VENTOLIN HFA 90 mcg/actuation Inhalation HFA Aerosol Inhaler   . predniSONE (DELTASONE) 5 mg Oral Tablet   . benzonatate (TESSALON) 100 mg Oral Capsule       Return in about 3 months (around 10/01/2017).    Linzie Collin, MD

## 2017-07-01 NOTE — Telephone Encounter (Signed)
Pharmacy

## 2017-07-18 ENCOUNTER — Encounter (INDEPENDENT_AMBULATORY_CARE_PROVIDER_SITE_OTHER): Payer: Self-pay | Admitting: Family Medicine

## 2017-07-20 ENCOUNTER — Ambulatory Visit
Admission: RE | Admit: 2017-07-20 | Discharge: 2017-07-20 | Disposition: A | Discharge status: Still In Hospital | Payer: Medicare Other | Source: Ambulatory Visit | Attending: Foot & Ankle Surgery | Admitting: Foot & Ankle Surgery

## 2017-07-20 DIAGNOSIS — Z4889 Encounter for other specified surgical aftercare: Secondary | ICD-10-CM | POA: Insufficient documentation

## 2017-07-20 NOTE — PT Evaluation (Signed)
[facility]  [place], [place] [address]      PLAN OF TREATMENT FOR OUTPATIENT REHABILITATION  Department of Health And Human Services  Centers for Medicare & Medicaid Services    Outpatient Physical Therapy Evaluation  700 Form    Toni Tran, Toni Tran,  Date of Birth:  October 20, 1956  Med Rec #:  [id number]  Date of evaluation:  07/20/2017    Referring provider:  Thera Flake, DPM  Next Provider Appt:   Medical Diagnosis:  Plantar Fasciitis R  PT/OT Diagnosis:  S/P TT Release and PF release R Foot on 06/07/17  Recert Date:  12/13/89     Past Medical History:   Diagnosis Date    Anxiety     Blood 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 Bone Spur, Repair Rotator Cuff Tear    HX TUBAL LIGATION  1992     Subjective:    MOI/DOO: Had R foot issues for 3 months prior to surgery.  S/P TT Release and PF release R Foot on 06/07/17  Prior level of function: Independent  Medications: No; see intake form;  Imaging: None since surgery  Previous similar sx's: NA;  Previous treatment & response: NA  Since onset sx's have:  better  Pain location: around incision medial foot/ankle; Type of pain: sharp;  Frequency: Intermittent  Pain intensity:   Present - 3/10; At best - 3/10; At worst - 7/10  Sx's decrease w/rest/ice  Occupation: Disability; Work restrictions: NA  Current exercise program: No; Type/Duration - NA    Objective:   AROM R Ankle  Dorsiflexion= 8 deg; Plantarflexion=35 deg  Inversion=15 deg; Eversion=3 deg    AROM L Ankle=WFL    STRENGTH:  R Ankle  Dorsiflexion=4-/5; Plantarflexion=4+/5  Inversion=3+/5; Eversion=3+/5  L Ankle = 5- to 5/5    GAIT: Minimally antalgic w/decreased time stance phase R  PALPATION: Moderate medial arch  OBSERVATION: Mod obesity    TESTS:    Assessment:    Pt had R foot issues for 3 months prior to surgery.  S/P TT Release and PF release R Foot on 06/07/17.  She has deficits in R foot pain, edema, ROM, LE flexibility, strength, gait, and ADL function. Will through skilled care seek to correct deficits noted above.     Evaluation: Low Complexity    Fall Risk Assessment  Pt has a history of falls (fallen in the  last year) and has a history of one of the following:  Seizure disorder, Orthostatic blood pressure problems or stroke/CVA    Problem list:  She has deficits in R foot pain, edema, ROM, LE flexibility, strength, gait, and ADL function.    Function Related G- Codes:  Mobility  Measure:  AFIndex=49%  Current Status/Complexity Modifier:  >/= 40% to < 60%  Projected Goal Status/Complexity Modifier:  > 0% to < 20%    Goals  Short Term:  1) Independent with HEP w/in 2 weeks  2) Able to ambulate w/o pain > 4/10 w/in 2 weeks  Long Term: 08/19/17  1) Able to ambulate 20 min w/o pain > 2/10 w/in 4 weeks  2) Able to sleep through night w/o  pain > 1-2/10 w/in 4 weeks  3) Able to go sit>stand w/o pain w/in 4 weeks  4) Able to go up/down stairs w/o sx's w/in 4 weeks  5) Able to don/doff shoes/socks w/o pain w/in 4 weeks  6) Improve AFIndex Score to <20% w/in 4 weeks      POC:    Treatment Plan (list treatment modalities):  Ultrasound, Electrical stimulation, Moist heat, Ice, Aquatic therapy, Manual therapy, Joint mobilization,  Flexibility/ROM exercises, Therapeutic exercises, Therapeutic taping and Gait training    The patient will be seen 2-3 times a week for 4 weeks for skilled care to achieve the above goals.    Rehab potential:  good  The patient/family have given verbal consent for evaluation and treatment.      Toni Tran, PT       Physician's signature:  ________________________________  Date: _________________

## 2017-07-27 ENCOUNTER — Ambulatory Visit: Payer: Self-pay

## 2017-07-29 ENCOUNTER — Ambulatory Visit: Payer: Self-pay

## 2017-08-02 ENCOUNTER — Telehealth (INDEPENDENT_AMBULATORY_CARE_PROVIDER_SITE_OTHER): Payer: Self-pay | Admitting: Family Medicine

## 2017-08-02 NOTE — Telephone Encounter (Signed)
Simvastatin called in.  Sharyon Cable, MA  08/02/2017, 15:19

## 2017-08-03 ENCOUNTER — Ambulatory Visit: Payer: Self-pay

## 2017-08-05 ENCOUNTER — Ambulatory Visit: Payer: Self-pay

## 2017-08-10 ENCOUNTER — Ambulatory Visit: Payer: Self-pay

## 2017-08-12 ENCOUNTER — Ambulatory Visit: Payer: Self-pay

## 2017-08-17 ENCOUNTER — Ambulatory Visit: Payer: Self-pay

## 2017-08-28 ENCOUNTER — Other Ambulatory Visit: Payer: Self-pay | Admitting: Family Medicine

## 2017-08-28 DIAGNOSIS — I10 Essential (primary) hypertension: Secondary | ICD-10-CM

## 2017-10-31 ENCOUNTER — Encounter (INDEPENDENT_AMBULATORY_CARE_PROVIDER_SITE_OTHER): Payer: Self-pay | Admitting: Family Medicine

## 2017-10-31 ENCOUNTER — Ambulatory Visit (INDEPENDENT_AMBULATORY_CARE_PROVIDER_SITE_OTHER): Payer: Medicare Other | Admitting: Family Medicine

## 2017-10-31 VITALS — BP 94/69 | HR 75 | Temp 96.0°F | Resp 12 | Ht 61.8 in | Wt 221.0 lb

## 2017-10-31 DIAGNOSIS — J45909 Unspecified asthma, uncomplicated: Secondary | ICD-10-CM

## 2017-10-31 DIAGNOSIS — E785 Hyperlipidemia, unspecified: Secondary | ICD-10-CM

## 2017-10-31 DIAGNOSIS — F32A Depression, unspecified: Secondary | ICD-10-CM

## 2017-10-31 DIAGNOSIS — F329 Major depressive disorder, single episode, unspecified: Secondary | ICD-10-CM

## 2017-10-31 DIAGNOSIS — I10 Essential (primary) hypertension: Secondary | ICD-10-CM

## 2017-10-31 DIAGNOSIS — G4700 Insomnia, unspecified: Secondary | ICD-10-CM

## 2017-10-31 DIAGNOSIS — J069 Acute upper respiratory infection, unspecified: Secondary | ICD-10-CM

## 2017-10-31 MED ORDER — TRAZODONE 50 MG TABLET: 50 mg | Tab | Freq: Every evening | ORAL | 2 refills | 0 days | Status: DC

## 2017-10-31 MED ORDER — SIMVASTATIN 20 MG TABLET
20.0000 mg | ORAL_TABLET | Freq: Every evening | ORAL | 1 refills | Status: DC
Start: 2017-10-31 — End: 2019-04-19

## 2017-10-31 MED ORDER — PREDNISONE 50 MG TABLET
50.0000 mg | ORAL_TABLET | Freq: Every day | ORAL | 0 refills | Status: DC
Start: 2017-10-31 — End: 2018-05-07

## 2017-10-31 MED ORDER — SERTRALINE 100 MG TABLET
100.0000 mg | ORAL_TABLET | Freq: Every day | ORAL | 5 refills | Status: DC
Start: 2017-10-31 — End: 2018-10-24

## 2017-10-31 MED ORDER — LISINOPRIL 20 MG-HYDROCHLOROTHIAZIDE 12.5 MG TABLET
1.0000 | ORAL_TABLET | Freq: Two times a day (BID) | ORAL | 1 refills | Status: DC
Start: 2017-10-31 — End: 2019-04-19

## 2017-10-31 MED ORDER — AMLODIPINE 5 MG TABLET
5.0000 mg | ORAL_TABLET | Freq: Every day | ORAL | 1 refills | Status: DC
Start: 2017-10-31 — End: 2019-04-19

## 2017-10-31 MED ORDER — AZITHROMYCIN 250 MG TABLET
ORAL_TABLET | ORAL | 0 refills | Status: DC
Start: 2017-10-31 — End: 2018-05-07

## 2017-10-31 NOTE — Progress Notes (Signed)
[facility]  [address]  [place] [id number]  Phone: [phone]  Fax: [phone]    Encounter Date: 10/31/2017    Patient ID:  Georgiann CockerKathy Benzel  MVH:[id number]    DOB: [birth year]  Age: 61 y.o. female    Subjective:     Chief Complaint   Patient presents with    Medication Refill     HPI   61y/o female sick for 2wks. Mentions uri sxs, coughing, wheezing, sob, denies smoking, no fever/chills, change in voice. Needs med refill. Has issue w/ sleep.   Current Outpatient Prescriptions   Medication Sig    amLODIPine (NORVASC) 5 mg Oral Tablet Take 1 Tab (5 mg total) by mouth Once a day AMLODIPINE BESYLATE    aspirin 325 mg Oral Tablet Take 325 mg by mouth Once a day    azithromycin (ZITHROMAX) 250 mg Oral Tablet Take 500 mg (2 tab) on day 1; take 250 mg (1 tab) on days 2-5.    benzonatate (TESSALON) 100 mg Oral Capsule Take 1 Cap (100 mg total) by mouth Three times a day (Patient not taking: Reported on 10/31/2017)    ergocalciferol, vitamin D2, (DRISDOL) 50,000 unit Oral Capsule Take 1 Cap (50,000 Units total) by mouth Every 7 days (Patient not taking: Reported on 10/31/2017)    gabapentin (NEURONTIN) 300 mg Oral Capsule 3 IN THE MORNING AND 3 AT BEDTIME (Patient not taking: Reported on 10/31/2017)    lisinopril-hydrochlorothiazide (ZESTORETIC) 20-12.5 mg Oral Tablet Take 1 Tab by mouth Twice daily    oxyCODONE-acetaminophen (PERCOCET) 5-325 mg Oral Tablet Take 1 Tab by mouth Every 8 hours as needed for Pain (Patient not taking: Reported on 10/31/2017)    predniSONE (DELTASONE) 50 mg Oral Tablet Take 1 Tab (50 mg total) by mouth Once a day    rOPINIRole (REQUIP) 0.25 mg Oral Tablet Take 0.25 mg by mouth Per instructions Taking 2 tablets nightly    sertraline (ZOLOFT) 100 mg Oral Tablet Take 1 Tab (100 mg total) by mouth Once a day 1.5tab po daily    simvastatin (ZOCOR) 20 mg Oral Tablet Take 1 Tab (20 mg total) by mouth Every evening    traZODone (DESYREL)  50 mg Oral Tablet Take 1 Tab (50 mg total) by mouth Every night    VENTOLIN HFA 90 mcg/actuation Inhalation HFA Aerosol Inhaler Take 2 Puffs by inhalation Every 6 hours as needed (Patient not taking: Reported on 10/31/2017)    [DISCONTINUED] amLODIPine (NORVASC) 5 mg Oral Tablet Take 5 mg by mouth Once a day AMLODIPINE BESYLATE    [DISCONTINUED] lisinopril-hydrochlorothiazide (ZESTORETIC) 20-12.5 mg Oral Tablet Take 1 Tab by mouth Twice daily    [DISCONTINUED] sertraline (ZOLOFT) 100 mg Oral Tablet 1.5tab po daily (Patient taking differently: 100 mg Once a day 1.5tab po daily)    [DISCONTINUED] simvastatin (ZOCOR) 20 mg Oral Tablet Take 1 Tab (20 mg total) by mouth Every evening     No Known Allergies  Past Medical History:   Diagnosis Date    Anxiety     Blood 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 Bone Spur, Repair Rotator Cuff Tear    HX TUBAL LIGATION  1992         Family Medical History:     Problem Relation (Age of Onset)    Alzheimer's/Dementia Mother, Father    Arthritis-osteo Mother, Father    Blood Clots Father    Congestive Heart Failure Father    Coronary Artery Disease Father    Diabetes Father    Heart Attack Mother,  Father    High Cholesterol Mother, Father    Hypertension Mother, Father    Migraines Mother, Father    Sleep disorders Father    Stroke Mother, Father            Social History   Substance Use Topics    Smoking status: Never Smoker    Smokeless tobacco: Never Used    Alcohol use No       Review of Systems   Constitutional: Positive for fatigue.   HENT: Positive for congestion.    Eyes: Negative.    Respiratory: Positive for cough, shortness of breath and wheezing.    Cardiovascular: Negative.    Gastrointestinal: Negative.    Musculoskeletal: Negative.    Skin: Negative.    Psychiatric/Behavioral: The patient is nervous/anxious.      Objective:   Vitals: BP 94/69   Pulse 75   Temp 35.6 C (96 F)   Resp 12   Ht 1.57 m (5' 1.8")   Wt 100.2 kg (221 lb)   SpO2 96%   Breastfeeding? No   BMI 40.68 kg/m2        Physical Exam   Constitutional: She is oriented to person, place, and time. She appears well-developed and well-nourished.   Fatigue appearing  sob   HENT:   Head: Normocephalic and atraumatic.   Eyes: Pupils are equal, round, and reactive to light.   Neck: Normal range of motion.   Cardiovascular: Normal rate, regular rhythm and normal heart sounds.    Pulmonary/Chest: She has wheezes.   Abdominal: Soft. Bowel sounds are normal.   Neurological: She is alert and oriented to person, place, and time.   Skin: Skin is warm.   Psychiatric: Her behavior is normal. Judgment and thought content normal.   anxious     Assessment & Plan:     ENCOUNTER DIAGNOSES     ICD-10-CM   1. Upper respiratory tract infection, unspecified type J06.9   2. RAD (reactive airway disease) J45.909   3. Hypertension, unspecified type I10   4. Depression, unspecified depression type F32.9   5. Hyperlipidemia, unspecified hyperlipidemia type E78.5   6. Insomnia, unspecified type G47.00   med refill  Neb tx    Orders Placed This Encounter    lisinopril-hydrochlorothiazide (ZESTORETIC) 20-12.5 mg Oral Tablet  amLODIPine (NORVASC) 5 mg Oral  Tablet    sertraline (ZOLOFT) 100 mg Oral Tablet    simvastatin (ZOCOR) 20 mg Oral Tablet    azithromycin (ZITHROMAX) 250 mg Oral Tablet    predniSONE (DELTASONE) 50 mg Oral Tablet    traZODone (DESYREL) 50 mg Oral Tablet       Return in about 4 weeks (around 11/28/2017).    Thelma BargeNgozi Ude-Oshiyoye, MD

## 2018-05-07 ENCOUNTER — Emergency Department (HOSPITAL_BASED_OUTPATIENT_CLINIC_OR_DEPARTMENT_OTHER)
Admission: EM | Admit: 2018-05-07 | Discharge: 2018-05-08 | Disposition: A | Discharge status: Home / Self Care | Payer: Medicare PPO | Attending: Emergency Medicine | Admitting: Emergency Medicine

## 2018-05-07 ENCOUNTER — Emergency Department (HOSPITAL_BASED_OUTPATIENT_CLINIC_OR_DEPARTMENT_OTHER): Payer: Medicare PPO

## 2018-05-07 ENCOUNTER — Encounter (HOSPITAL_BASED_OUTPATIENT_CLINIC_OR_DEPARTMENT_OTHER): Payer: Self-pay

## 2018-05-07 DIAGNOSIS — E669 Obesity, unspecified: Secondary | ICD-10-CM | POA: Insufficient documentation

## 2018-05-07 DIAGNOSIS — F329 Major depressive disorder, single episode, unspecified: Secondary | ICD-10-CM | POA: Insufficient documentation

## 2018-05-07 DIAGNOSIS — Z79891 Long term (current) use of opiate analgesic: Secondary | ICD-10-CM | POA: Insufficient documentation

## 2018-05-07 DIAGNOSIS — R609 Edema, unspecified: Secondary | ICD-10-CM | POA: Insufficient documentation

## 2018-05-07 DIAGNOSIS — Z6839 Body mass index (BMI) 39.0-39.9, adult: Secondary | ICD-10-CM | POA: Insufficient documentation

## 2018-05-07 DIAGNOSIS — Z79899 Other long term (current) drug therapy: Secondary | ICD-10-CM | POA: Insufficient documentation

## 2018-05-07 DIAGNOSIS — I10 Essential (primary) hypertension: Secondary | ICD-10-CM | POA: Insufficient documentation

## 2018-05-07 DIAGNOSIS — M79604 Pain in right leg: Secondary | ICD-10-CM | POA: Insufficient documentation

## 2018-05-07 DIAGNOSIS — Z7982 Long term (current) use of aspirin: Secondary | ICD-10-CM | POA: Insufficient documentation

## 2018-05-07 DIAGNOSIS — Z7901 Long term (current) use of anticoagulants: Secondary | ICD-10-CM | POA: Insufficient documentation

## 2018-05-07 DIAGNOSIS — R0781 Pleurodynia: Secondary | ICD-10-CM

## 2018-05-07 DIAGNOSIS — M79671 Pain in right foot: Secondary | ICD-10-CM | POA: Insufficient documentation

## 2018-05-07 DIAGNOSIS — E7800 Pure hypercholesterolemia, unspecified: Secondary | ICD-10-CM | POA: Insufficient documentation

## 2018-05-07 DIAGNOSIS — E559 Vitamin D deficiency, unspecified: Secondary | ICD-10-CM | POA: Insufficient documentation

## 2018-05-07 DIAGNOSIS — I69351 Hemiplegia and hemiparesis following cerebral infarction affecting right dominant side: Secondary | ICD-10-CM | POA: Insufficient documentation

## 2018-05-07 DIAGNOSIS — G8929 Other chronic pain: Secondary | ICD-10-CM | POA: Insufficient documentation

## 2018-05-07 DIAGNOSIS — G2581 Restless legs syndrome: Secondary | ICD-10-CM | POA: Insufficient documentation

## 2018-05-07 DIAGNOSIS — F419 Anxiety disorder, unspecified: Secondary | ICD-10-CM | POA: Insufficient documentation

## 2018-05-07 LAB — COMPREHENSIVE METABOLIC PROFILE - BMC/JMC ONLY
ALBUMIN/GLOBULIN RATIO: 1.8 (ref 0.8–2.0)
ALBUMIN: 4.2 g/dL (ref 3.5–5.0)
ALKALINE PHOSPHATASE: 78 U/L (ref 38–126)
ALT (SGPT): 26 U/L (ref 14–54)
ANION GAP: 6 mmol/L (ref 3–11)
AST (SGOT): 22 U/L (ref 15–41)
BILIRUBIN TOTAL: 1 mg/dL (ref 0.3–1.2)
BUN/CREA RATIO: 25 — ABNORMAL HIGH (ref 6–22)
BUN: 22 mg/dL — ABNORMAL HIGH (ref 6–20)
CALCIUM: 9.5 mg/dL (ref 8.8–10.2)
CHLORIDE: 101 mmol/L (ref 101–111)
CO2 TOTAL: 26 mmol/L (ref 22–32)
CREATININE: 0.89 mg/dL (ref 0.44–1.00)
ESTIMATED GFR: >60 mL/min/1.73mˆ2 (ref >60)
GLUCOSE: 109 mg/dL (ref 70–110)
POTASSIUM: 3.8 mmol/L (ref 3.4–5.1)
PROTEIN TOTAL: 6.6 g/dL (ref 6.4–8.3)
SODIUM: 133 mmol/L — ABNORMAL LOW (ref 136–145)

## 2018-05-07 LAB — CBC WITH DIFF
BASOPHIL #: 0.1 x10ˆ3/uL (ref 0.00–0.10)
BASOPHIL %: 1 % (ref 0–3)
EOSINOPHIL #: 0.4 x10ˆ3/uL (ref 0.00–0.50)
EOSINOPHIL %: 3 % (ref 0–5)
HCT: 37.6 % (ref 36.0–45.0)
HGB: 13.5 g/dL (ref 12.0–15.5)
LYMPHOCYTE #: 1.2 10*3/uL (ref 1.00–4.80)
LYMPHOCYTE %: 9 % — ABNORMAL LOW (ref 15–43)
MCH: 33.6 pg — ABNORMAL HIGH (ref 27.5–33.2)
MCHC: 35.9 g/dL (ref 32.0–36.0)
MCV: 93.7 fL (ref 82.0–97.0)
MONOCYTE #: 1.2 x10ˆ3/uL — ABNORMAL HIGH (ref 0.20–0.90)
MONOCYTE %: 9 % (ref 5–12)
MPV: 9.3 fL (ref 7.4–10.5)
NEUTROPHIL #: 10.9 x10ˆ3/uL — ABNORMAL HIGH (ref 1.50–6.50)
NEUTROPHIL %: 79 % — ABNORMAL HIGH (ref 43–76)
PLATELETS: 166 x10?3/uL (ref 150–450)
PLATELETS: 166 x10ˆ3/uL (ref 150–450)
RBC: 4.01 10*6/uL (ref 4.00–5.10)
RDW: 12.5 % (ref 11.0–16.0)
WBC: 13.8 x10ˆ3/uL — ABNORMAL HIGH (ref 4.0–11.0)

## 2018-05-07 LAB — TROPONIN-I
TROPONIN I: <0.03 ng/mL (ref <=0.06)
TROPONIN I: <0.03 ng/mL (ref <=0.06)

## 2018-05-07 MED ORDER — FENTANYL (PF) 50 MCG/ML INJECTION SOLUTION
50.00 ug | INTRAMUSCULAR | Status: AC
Start: 2018-05-07 — End: 2018-05-07
  Administered 2018-05-07: 50 ug via INTRAVENOUS
  Filled 2018-05-07: qty 2

## 2018-05-07 MED ORDER — ONDANSETRON HCL (PF) 4 MG/2 ML INJECTION SOLUTION
4.00 mg | INTRAMUSCULAR | Status: AC
Start: 2018-05-07 — End: 2018-05-07
  Administered 2018-05-07 (×2): 4 mg via INTRAVENOUS
  Filled 2018-05-07: qty 2

## 2018-05-07 MED ORDER — IOPAMIDOL 370 MG IODINE/ML (76 %) INTRAVENOUS SOLUTION
100.00 mL | INTRAVENOUS | Status: AC
Start: 2018-05-07 — End: 2018-05-07
  Filled 2018-05-07: qty 100

## 2018-05-07 NOTE — ED Triage Notes (Addendum)
Patient reports mid back started x 5 days ago, worsening today- accompanied with right rib pain. C/O SOB, denies CP. Denies injury

## 2018-05-07 NOTE — ED Nurses Note (Signed)
Patient discharged home with family.  AVS reviewed with patient/care giver.  A written copy of the AVS and discharge instructions was given to the patient/care giver.  Questions sufficiently answered as needed.  Patient/care giver encouraged to follow up with PCP as indicated.  In the event of an emergency, patient/care giver instructed to call 911 or go to the nearest emergency room.      Current Discharge Medication List      CONTINUE these medications - NO CHANGES were made during your visit.      Details   amLODIPine 5 mg Tablet  Commonly known as:  NORVASC   5 mg, Oral, DAILY, AMLODIPINE BESYLATE   Qty:  90 Tab  Refills:  1     aspirin 325 mg Tablet   325 mg, Oral, DAILY  Refills:  0     ergocalciferol (vitamin D2) 50,000 unit Capsule  Commonly known as:  DRISDOL   50,000 Units, Oral, EVERY 7 DAYS  Qty:  8 Cap  Refills:  2     gabapentin 300 mg Capsule  Commonly known as:  NEURONTIN   3 IN THE MORNING AND 3 AT BEDTIME   Qty:  180 Cap  Refills:  2     lisinopril-hydrochlorothiazide 20-12.5 mg Tablet  Commonly known as:  ZESTORETIC   1 Tab, Oral, 2 TIMES DAILY  Qty:  180 Tab  Refills:  1     oxyCODONE-acetaminophen 5-325 mg Tablet  Commonly known as:  PERCOCET   1 Tab, Oral, EVERY 8 HOURS PRN  Qty:  30 Tab  Refills:  0     rOPINIRole 0.25 mg Tablet  Commonly known as:  REQUIP   0.25 mg, Oral, SEE COMMENT, Taking 2 tablets nightly  Refills:  0     sertraline 100 mg Tablet  Commonly known as:  ZOLOFT   100 mg, Oral, DAILY, 1.5tab po daily  Qty:  45 Tab  Refills:  5     simvastatin 20 mg Tablet  Commonly known as:  ZOCOR   20 mg, Oral, EVERY EVENING  Qty:  90 Tab  Refills:  1     traZODone 50 mg Tablet  Commonly known as:  DESYREL   50 mg, Oral, NIGHTLY  Qty:  30 Tab  Refills:  2     VENTOLIN HFA 90 mcg/actuation HFA Aerosol Inhaler  Generic drug:  albuterol sulfate   2 Puffs, Inhalation, EVERY 6 HOURS PRN  Qty:  1 Inhaler  Refills:  0

## 2018-05-07 NOTE — ED Nurses Note (Signed)
Pt waiting for repeat tropI results. No change in assessment.  PA-c into see pt and discuss labwork and CT results with pt.

## 2018-05-07 NOTE — ED Nurses Note (Signed)
Pt states that she feels better after IV pain meds, denies nausea.  Pt continues in NSR per heart monitor. No change in assessment.

## 2018-05-07 NOTE — ED Provider Notes (Signed)
Toni Leigh, PA-C  [facility]  [address]  [place], [place] [address]    Emergency Department Visit Note      Date: 05/07/2018  Primary care provider: [name], MD  Means of arrival: private car  History obtained by: patient  History limited by: none    Chief Complaint:  Back Pain      History of Present Illness     Toni Tran, date of birth 1956/01/29, is a 62 y.o. female who presents to the Emergency Department complaining of back pain x 4 days. Patient states her symptoms initially began as pain underneath both shoulder blades but has worsened and radiated down into her lower back and wrapping around to her right rib over the last few days. She states she also developed shortness of breath this AM upon awakening. She states her pain is worse with breathing and movement and nothing seems to make the pain better. States she has taken Tylenol and IBU and it has not provided her symptom relief. She states she has never experienced symptoms like this before. She denies allergies to any medications.     Denies headache, chest pain, vomiting, diarrhea, constipation, weakness or paresthesias of LE. Denies unilateral leg swelling or erythema. Denies recent injury, fall or trauma. She denies PMH of DVT or blood clot but states her Dad had one. She denies smoking history. She has a PMH of HTN, hyperlipidemia and CVA x 2 in 2011 and 2013.    PERC rule  * Age < 50 years Absent    * HR < 100 Absent    * Oxyhemoglobin saturation >95% Present   * Hemoptysis Absent    * Estrogen use Absent    * Prior DVT or PE Absent    * Unilateral leg swelling Absent    * Surgery/trauma requiring hospitalization within prior four weeks Absent      Valid for pts with a low clinical probability of PE, in pts with low probability of PE who fulfill all 8 criteria, the likelihood of PE is low and no further testing is required.       Heart Score  History: Slightly suspicious = 0  EKG: Normal = 0  Age: 40-64 = +1  Risk  factors: > 3 (HTN, hypercholesteremia, CAD, obesity, positive family history) = +2  Initial Tropoin: < normal limit = 0  Total score: 3    Review of Systems     The pertinent positive and negative symptoms are as per HPI. All other systems reviewed and are negative.    Patient History      Past Medical History:  Past Medical History:   Diagnosis Date   . Anxiety    . 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 daily   . Chronic pain     right foot/right leg   . 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 Depression    . Hypercholesterolemia    . Hyperlipidemia    . Hypertension    . Obesity    . Peripheral edema     right foot   . Plantar fascial fibromatosis of right foot    . PONV (postoperative nausea and vomiting)     requests Anti Emetic   . RLS (restless legs syndrome)    . Snores     Never has had sleep  study   . Tarsal tunnel syndrome, right    . Vitamin D deficiency    . Wears glasses      Past Surgical History:  Past Surgical History:   Procedure Laterality Date   . HX APPENDECTOMY  childhood   . HX EYE SURGERY Bilateral Childhood    Muscle Surgery   . HX SHOULDER SURGERY Right 06/2016    Exc. Bone Spur, Repair Rotator Cuff Tear   . HX TUBAL LIGATION  1992     Family History:  Family Medical History:     Problem Relation (Age of Onset)    Alzheimer's/Dementia Mother, Father    Arthritis-osteo Mother, Father    Blood Clots Father    Congestive Heart Failure Father    Coronary Artery Disease Father    Diabetes Father    Heart Attack Mother, Father    High Cholesterol Mother, Father    Hypertension (High Blood Pressure) Mother, Father    Migraines Mother, Father    Sleep disorders Father    Stroke Mother, Father        Social History:  Social History     Tobacco Use   . Smoking status: Never Smoker   . Smokeless tobacco: Never Used   Substance Use Topics   . Alcohol use: No   . Drug use: No     Social  History     Substance and Sexual Activity   Drug Use No     Medications:  Patient's Medications   New Prescriptions    No medications on file   Previous Medications    AMLODIPINE (NORVASC) 5 MG ORAL TABLET    Take 1 Tab (5 mg total) by mouth Once a day AMLODIPINE BESYLATE    ASPIRIN 325 MG ORAL TABLET    Take 325 mg by mouth Once a day    ERGOCALCIFEROL, VITAMIN D2, (DRISDOL) 50,000 UNIT ORAL CAPSULE    Take 1 Cap (50,000 Units total) by mouth Every 7 days    GABAPENTIN (NEURONTIN) 300 MG ORAL CAPSULE    3 IN THE MORNING AND 3 AT BEDTIME    LISINOPRIL-HYDROCHLOROTHIAZIDE (ZESTORETIC) 20-12.5 MG ORAL TABLET    Take 1 Tab by mouth Twice daily    OXYCODONE-ACETAMINOPHEN (PERCOCET) 5-325 MG ORAL TABLET    Take 1 Tab by mouth Every 8 hours as needed for Pain    ROPINIROLE (REQUIP) 0.25 MG ORAL TABLET    Take 0.25 mg by mouth Per instructions Taking 2 tablets nightly    SERTRALINE (ZOLOFT) 100 MG ORAL TABLET    Take 1 Tab (100 mg total) by mouth Once a day 1.5tab po daily    SIMVASTATIN (ZOCOR) 20 MG ORAL TABLET    Take 1 Tab (20 mg total) by mouth Every evening    TRAZODONE (DESYREL) 50 MG ORAL TABLET    Take 1 Tab (50 mg total) by mouth Every night    VENTOLIN HFA 90 MCG/ACTUATION INHALATION HFA AEROSOL INHALER    Take 2 Puffs by inhalation Every 6 hours as needed   Modified Medications    No medications on file   Discontinued Medications    AZITHROMYCIN (ZITHROMAX) 250 MG ORAL TABLET    Take 500 mg (2 tab) on day 1; take 250 mg (1 tab) on days 2-5.    BENZONATATE (TESSALON) 100 MG ORAL CAPSULE    Take 1 Cap (100 mg total) by mouth Three times a day    PREDNISONE (DELTASONE) 50 MG ORAL TABLET  Take 1 Tab (50 mg total) by mouth Once a day     Allergies:   No Known Allergies    Physical Exam     Vital Signs:    Filed Vitals:    05/07/18 1916 05/07/18 2000 05/07/18 2015 05/07/18 2100   BP: (!) 152/62 (!) 156/66 (!) 142/74 126/81   Pulse: (!) 111 91 91 88   Resp: 20 (!) 22 (!) 23 (!) 23   Temp: 98.4 F      SpO2: 99% 95%  96% 95%     Pulse Ox: 99% on None (Room Air); interpreted by me [id number]    Constitutional: This is an awake and WDWN female patient who is uncomfortable appearing.     Neuro/Psychiatric: Patient is interactive and appropriate. Normal facial symmetry and speech.  Skin: Warm, dry and good color. No obvious rash noted. No signs of trauma to back or chest.  HEENT:     Head: Normocephalic and atraumatic.  Mouth: Oropharynx is clear and moist.  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 Left pupil reactive to light. Conjunctiva normal in appearance, no gross deformity noted.  Neck: Trachea midline. Neck supple. No adenopathy  Cardiovascular: RRR, no obvious murmurs, rubs or gallops appreciated. Intact distal pulses.    Pulmonary/Chest: No respiratory distress. BS clear and equal to auscultation bilaterally. Significant tenderness to palpation of right lower ribs and upper back.   Abdominal: Abdomen soft, no tenderness, rebound or guarding. No obvious masses or organomegaly.  Musculoskeletal: No obvious edema or deformity. No evidence of trauma. Moves spine and extremities freely. No evidence of unilateral LE swelling or erythema. No calf tenderness bilaterally.    Diagnostics     Labs:    Results for orders placed or performed during the hospital encounter of 05/07/18   COMPREHENSIVE METABOLIC PROFILE - [facility] ONLY   Result Value Ref Range    SODIUM 133 (L) 136 - 145 mmol/L    POTASSIUM 3.8 3.4 - 5.1 mmol/L    CHLORIDE 101 101 - 111 mmol/L    CO2 TOTAL 26 22 - 32 mmol/L    ANION GAP 6 3 - 11 mmol/L    BUN 22 (H) 6 - 20 mg/dL    CREATININE 5.40 9.81 - 1.00 mg/dL    BUN/CREA RATIO 25 (H) 6 - 22    ESTIMATED GFR >60 >60 mL/min/1.26m^2    ALBUMIN 4.2 3.5 - 5.0 g/dL    CALCIUM 9.5 8.8 - 19.1 mg/dL    GLUCOSE 478 70 - 295 mg/dL    ALKALINE PHOSPHATASE 78 38 - 126 U/L    ALT (SGPT) 26 14 - 54 U/L    AST (SGOT) 22 15 - 41 U/L    BILIRUBIN TOTAL 1.0  0.3 - 1.2 mg/dL    PROTEIN TOTAL 6.6 6.4 - 8.3 g/dL    ALBUMIN/GLOBULIN RATIO 1.8 0.8 - 2.0   TROPONIN-I   Result Value Ref Range    TROPONIN I <0.03 <=0.06 ng/mL   CBC WITH DIFF   Result Value Ref Range    WBC 13.8 (H) 4.0 - 11.0 x10^3/uL    RBC 4.01 4.00 - 5.10 x10^6/uL    HGB 13.5 12.0 - 15.5 g/dL    HCT 62.1 30.8 - 65.7 %    MCV 93.7 82.0 - 97.0 fL    MCH 33.6 (H) 27.5 - 33.2 pg    MCHC 35.9 32.0 - 36.0 g/dL  RDW 12.5 11.0 - 16.0 %    PLATELETS 166 150 - 450 x10^3/uL    MPV 9.3 7.4 - 10.5 fL    NEUTROPHIL % 79 (H) 43 - 76 %    LYMPHOCYTE % 9 (L) 15 - 43 %    MONOCYTE % 9 5 - 12 %    EOSINOPHIL % 3 0 - 5 %    BASOPHIL % 1 0 - 3 %    NEUTROPHIL # 10.90 (H) 1.50 - 6.50 x10^3/uL    LYMPHOCYTE # 1.20 1.00 - 4.80 x10^3/uL    MONOCYTE # 1.20 (H) 0.20 - 0.90 x10^3/uL    EOSINOPHIL # 0.40 0.00 - 0.50 x10^3/uL    BASOPHIL # 0.10 0.00 - 0.10 x10^3/uL   TROPONIN-I   Result Value Ref Range    TROPONIN I <0.03 <=0.06 ng/mL   ECG 12-LEAD   Result Value Ref Range    Ventricular rate 109 BPM    Atrial Rate 109 BPM    PR Interval 152 ms    QRS Duration 124 ms    QT Interval 356 ms    QTC Calculation 479 ms    Calculated P Axis 53 degrees    Calculated R Axis -27 degrees    Calculated T Axis 17 degrees     Labs reviewed and interpreted by me.    Radiology:    Ordered this visit:  CT ANGIO CHEST FOR PULMONARY EMBOLUS     Results:  Results for orders placed or performed during the hospital encounter of 05/07/18 (from the past 72 hour(s))   CT ANGIO CHEST FOR PULMONARY EMBOLUS     Status: None    Narrative    RADIOLOGIST: Viann Fish, MD    EXAMINATION: CT ANGIO CHEST FOR PULMONARY EMBOLUS     TECHNIQUE: Axial plane images were performed. Coronal and Sagittal  reformatted image were obtained.  CLINICAL INDICATION: supscapular pain, right rib pain, SOB, tachycardia    DLP: 1239.90 mGy.cm    CONTRAST: Isovue 370    FINDINGS:    Evaluation of the pulmonary arteries demonstrates no evidence for PE.    There are no acute infiltrates.  There are no edema or pleural effusions.       Impression    No evidence for PE. No acute infiltrates.     MIPS PERFORMANCE METRICS:  1. Dose reduction technique used. Automatic exposure control (AEC) was  applied.  2. Count of high-dose radiation studies (CT and cardiac and NM) last 12  months: 0.         Radiologist location ID: [id number]       Interpreted by radiologist Dr. Fortunato Curling    EKG interpretation:  12-Lead EKG reveals, per Dr. Herbie Drape -  sinus tachycardia, rate of 109, right bundle branch block, moderate voltage criteria for LVH may be a normal variant. No change compared to previous EKG from 05/2017.    ED Progress Note/Medical Decision Making     Old records reviewed by me:   ED Visits: No previous ED visits able to be viewed on Epic.     I have reviewed the patient's recent past medical history. Nurse's notes reviewed.  Orders placed during this encounter:  Orders Placed This Encounter   . CANCELED: XR CHEST PA & LATERAL   . CT ANGIO CHEST FOR PULMONARY EMBOLUS   . CBC/DIFF   . COMPREHENSIVE METABOLIC PROFILE - [facility] ONLY   . TROPONIN-I   . CBC WITH DIFF   . TROPONIN-I   .  ECG 12-LEAD   . CANCELED: INSERT & MAINTAIN PERIPHERAL IV ACCESS   . fentaNYL (SUBLIMAZE) 50 mcg/mL injection   . ondansetron (ZOFRAN) 2 mg/mL injection   . iopamidol (ISOVUE-370) 76% infusion     EKG, Troponin and labs ordered prior to my initial evaluation.    18:15 Patient was initially evaluated by me, possible etiologies for symptoms were discussed with patient and the need for further evaluation with the above labs as well as CT angio chest, IV Fentanyl and IV Zofran. Patient verbalized understanding and was in agreement with the plan at this time    18:30 Discussed patient case with attending physician Dr. Herbie Drape. He agreed with the assessment and plan at this time. He advised I contact hospitalist after CT angio has resulted for possible admission for further evaluation based.    20:00 Checked on patient and she states her  pain has improved. Discussed with patient that EKG and initial Troponin was negative for ACS. Awaiting to take patient to CT for further evaluation of symptoms. She verbalized understanding and had no further questions.    20:50 Discussed patient case with Dr. Bing Quarry (hospitalist on-call.) He advised since CT angio was negative for acute PE and Heartscore is 3, he believes that her chest pain is pleuritic in origin and does not require hospitalization for further evaluation or observation at this time. He advised redraw of Tropoinin and if it remains negative, she can be discharged home and be closely followed-up by PCP outpatient. Joneen Roach thanked him for his consultation.       21:00 Discussed with patient my conversation with Dr. Bing Quarry and the plan to redraw Troponin lab and if negative, she can be discharged home. She verbalized understanding and was in agreement with the plan at this time. She had no further requests at this time.    22:05 Negative Tropoin results discussed with patient and advised she use OTC ibuprofen and continue to apply topical heat for pain and she follow up with her PCP in next few days if her symptoms do not improve. She verbalized understanding and had no further questions at this time.     The patient was advised that if symptoms are to worsen that she is to return to the ED immediately, she verbalized understanding. Return precautions were discussed, the patient verbalized understanding and is currently stable for discharge. All questions and concerns were answered to her satisfaction and the patient is in agreement with the plan.     Supervising physician: Dr. Levert Feinstein Vitals:    05/07/18 1916 05/07/18 2000 05/07/18 2015 05/07/18 2100   BP: (!) 152/62 (!) 156/66 (!) 142/74 126/81   Pulse: (!) 111 91 91 88   Resp: 20 (!) 22 (!) 23 (!) 23   Temp: 98.4 F      SpO2: 99% 95% 96% 95%     Clinical Impression      Encounter Diagnosis   Name Primary?   . Chest pain, pleuritic Yes             Plan/Disposition     Discharged    Follow Up:  [facility]  [facility]  [place] [address]  [phone]    If symptoms worsen    [name], MD  [address]  [place] [address]  [phone]      If symptoms do not improve        Condition on Disposition: Stable

## 2018-05-08 LAB — ECG 12-LEAD
Atrial Rate: 109 {beats}/min
Calculated P Axis: 53 degrees
Calculated T Axis: 17 degrees
PR Interval: 152 ms
QRS Duration: 124 ms
QT Interval: 356 ms
QTC Calculation: 479 ms
Ventricular rate: 109 {beats}/min

## 2018-05-10 ENCOUNTER — Ambulatory Visit (INDEPENDENT_AMBULATORY_CARE_PROVIDER_SITE_OTHER): Payer: Medicare Other | Admitting: Family Medicine

## 2018-05-10 ENCOUNTER — Encounter (INDEPENDENT_AMBULATORY_CARE_PROVIDER_SITE_OTHER): Payer: Self-pay | Admitting: Family Medicine

## 2018-05-10 VITALS — BP 90/63 | HR 91 | Temp 97.5°F | Resp 16 | Ht 61.8 in | Wt 215.2 lb

## 2018-05-10 DIAGNOSIS — J069 Acute upper respiratory infection, unspecified: Secondary | ICD-10-CM

## 2018-05-10 DIAGNOSIS — R079 Chest pain, unspecified: Secondary | ICD-10-CM

## 2018-05-10 DIAGNOSIS — M549 Dorsalgia, unspecified: Secondary | ICD-10-CM

## 2018-05-10 MED ORDER — GABAPENTIN 300 MG CAPSULE
ORAL_CAPSULE | ORAL | 2 refills | Status: DC
Start: 2018-05-10 — End: 2019-04-19

## 2018-05-10 MED ORDER — AZITHROMYCIN 250 MG TABLET
ORAL_TABLET | ORAL | 0 refills | Status: DC
Start: 2018-05-10 — End: 2019-11-16

## 2018-05-10 MED ORDER — PREDNISONE 50 MG TABLET
50.0000 mg | ORAL_TABLET | Freq: Every day | ORAL | 0 refills | Status: AC
Start: 2018-05-10 — End: 2018-05-17

## 2018-05-10 NOTE — Progress Notes (Signed)
[facility]  [address]  [place] [address]  Phone: [phone]  Fax: [phone]    Encounter Date: 05/10/2018    Patient ID:  Toni Tran  MVH:[id number]    DOB: January 23, 1956  Age: 62 y.o. female    Subjective:     Chief Complaint   Patient presents with   . ED Follow-up     HPI   Pain in upper back and wraps around the chest. Dyspnea. Pain is sharp. Sob, wheezing. Uri symptoms, rhinorrhea, coughing. Seen in ED and CT angio negative.   Current Outpatient Medications   Medication Sig   . amLODIPine (NORVASC) 5 mg Oral Tablet Take 1 Tab (5 mg total) by mouth Once a day AMLODIPINE BESYLATE   . aspirin 325 mg Oral Tablet Take 325 mg by mouth Once a day   . azithromycin (ZITHROMAX) 250 mg Oral Tablet Take 500 mg (2 tab) on day 1; take 250 mg (1 tab) on days 2-5.   . ergocalciferol, vitamin D2, (DRISDOL) 50,000 unit Oral Capsule Take 1 Cap (50,000 Units total) by mouth Every 7 days (Patient not taking: Reported on 10/31/2017)   . gabapentin (NEURONTIN) 300 mg Oral Capsule 3 IN THE MORNING AND 3 AT BEDTIME   . lisinopril-hydrochlorothiazide (ZESTORETIC) 20-12.5 mg Oral Tablet Take 1 Tab by mouth Twice daily   . oxyCODONE-acetaminophen (PERCOCET) 5-325 mg Oral Tablet Take 1 Tab by mouth Every 8 hours as needed for Pain   . predniSONE (DELTASONE) 50 mg Oral Tablet Take 1 Tab (50 mg total) by mouth Once a day for 7 days   . rOPINIRole (REQUIP) 0.25 mg Oral Tablet Take 0.25 mg by mouth Per instructions Taking 2 tablets nightly   . sertraline (ZOLOFT) 100 mg Oral Tablet Take 1 Tab (100 mg total) by mouth Once a day 1.5tab po daily   . simvastatin (ZOCOR) 20 mg Oral Tablet Take 1 Tab (20 mg total) by mouth Every evening   . traZODone (DESYREL) 50 mg Oral Tablet Take 1 Tab (50 mg total) by mouth Every night   . VENTOLIN HFA 90 mcg/actuation Inhalation HFA Aerosol Inhaler Take 2 Puffs by inhalation Every 6 hours as needed (Patient not taking: Reported on 10/31/2017)     No  Known Allergies  Past Medical History:   Diagnosis Date   . Anxiety    . 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 daily   . Chronic pain     right foot/right leg   . 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 Depression    . Hypercholesterolemia    . Hyperlipidemia    . Hypertension    . Obesity    . Peripheral edema     right foot   . Plantar fascial fibromatosis of right foot    . PONV (postoperative nausea and vomiting)     requests Anti Emetic   . RLS (restless legs syndrome)    . Snores     Never has had sleep study   . Tarsal tunnel syndrome, right    . Vitamin D deficiency    . Wears glasses          Past Surgical History:   Procedure Laterality Date   . HX APPENDECTOMY  childhood   . HX EYE SURGERY Bilateral Childhood    Muscle Surgery   .  HX SHOULDER SURGERY Right 06/2016    Exc. Bone Spur, Repair Rotator Cuff Tear   . HX TUBAL LIGATION  1992         Family Medical History:     Problem Relation (Age of Onset)    Alzheimer's/Dementia Mother, Father    Arthritis-osteo Mother, Father    Blood Clots Father    Congestive Heart Failure Father    Coronary Artery Disease Father    Diabetes Father    Heart Attack Mother, Father    High Cholesterol Mother, Father    Hypertension (High Blood Pressure) Mother, Father    Migraines Mother, Father    Sleep disorders Father    Stroke Mother, Father            Social History     Tobacco Use   . Smoking status: Never Smoker   . Smokeless tobacco: Never Used   Substance Use Topics   . Alcohol use: No   . Drug use: No       Review of Systems   Constitutional: Positive for fatigue.   HENT: Negative.    Eyes: Negative.    Respiratory: Positive for cough.    Cardiovascular: Positive for chest pain.   Gastrointestinal: Negative.    Musculoskeletal: Negative.    Skin: Negative.    Neurological: Positive for numbness.   Psychiatric/Behavioral: Negative.      Objective:      Vitals: BP 90/63   Pulse 91   Temp 36.4 C (97.5 F)   Resp 16   Ht 1.57 m (5' 1.8")   Wt 97.6 kg (215 lb 3.2 oz)   SpO2 97%   Breastfeeding? No   BMI 39.62 kg/m         Physical Exam   Constitutional: She is oriented to person, place, and time. She appears well-developed and well-nourished.   obese   HENT:   Head: Normocephalic and atraumatic.   Eyes: Pupils are equal, round, and reactive to light.   Neck: Normal range of motion.   Cardiovascular: Normal rate, regular rhythm and normal heart sounds.   Pulmonary/Chest: Effort normal and breath sounds normal.   Abdominal: Soft. Bowel sounds are normal.   Neurological: She is alert and oriented to person, place, and time.   Skin: Skin is warm.   Psychiatric: Her behavior is normal. Judgment and thought content normal.       Assessment & Plan:     ENCOUNTER DIAGNOSES     ICD-10-CM   1. Back pain, unspecified back location, unspecified back pain laterality, unspecified chronicity M54.9   2. Chest pain, unspecified type R07.9   3. Upper respiratory tract infection, unspecified type J06.9     Med refill    Orders Placed This Encounter   . gabapentin (NEURONTIN) 300 mg Oral Capsule   . predniSONE (DELTASONE) 50 mg Oral Tablet   . azithromycin (ZITHROMAX) 250 mg Oral Tablet       No follow-ups on file.    [name], MD

## 2018-05-26 ENCOUNTER — Telehealth (INDEPENDENT_AMBULATORY_CARE_PROVIDER_SITE_OTHER): Payer: Self-pay | Admitting: Family Medicine

## 2018-05-26 NOTE — Telephone Encounter (Signed)
Patient's refills for Lisinopril, Zocor, and Norvasc was called in to [facility]. Left on voicemail.  Purvis KiltsAshley Izekiel Flegel, MA  05/26/2018, 16:20

## 2018-07-03 ENCOUNTER — Ambulatory Visit: Payer: Medicare Other | Attending: Family Medicine

## 2018-07-03 ENCOUNTER — Encounter (INDEPENDENT_AMBULATORY_CARE_PROVIDER_SITE_OTHER): Payer: Self-pay | Admitting: Family Medicine

## 2018-07-03 ENCOUNTER — Ambulatory Visit (INDEPENDENT_AMBULATORY_CARE_PROVIDER_SITE_OTHER): Payer: Medicare Other | Admitting: Family Medicine

## 2018-07-03 VITALS — BP 135/87 | HR 76 | Temp 97.7°F | Resp 12 | Ht 61.8 in | Wt 219.0 lb

## 2018-07-03 DIAGNOSIS — R079 Chest pain, unspecified: Secondary | ICD-10-CM

## 2018-07-03 DIAGNOSIS — R5383 Other fatigue: Secondary | ICD-10-CM

## 2018-07-03 DIAGNOSIS — R0602 Shortness of breath: Secondary | ICD-10-CM | POA: Insufficient documentation

## 2018-07-03 LAB — COMPREHENSIVE METABOLIC PANEL, NON-FASTING
ALBUMIN/GLOBULIN RATIO: 1.6 (ref 0.8–2.0)
ALBUMIN: 4 g/dL (ref 3.5–5.0)
ALKALINE PHOSPHATASE: 88 U/L (ref 38–126)
ALT (SGPT): 23 U/L (ref 14–54)
ANION GAP: 13 mmol/L — ABNORMAL HIGH (ref 3–11)
AST (SGOT): 23 U/L (ref 15–41)
BILIRUBIN TOTAL: 0.8 mg/dL (ref 0.3–1.2)
BUN/CREA RATIO: 19 (ref 6–22)
BUN/CREA RATIO: 19 (ref 6–22)
BUN: 16 mg/dL (ref 6–20)
CALCIUM: 9.5 mg/dL (ref 8.8–10.2)
CHLORIDE: 102 mmol/L (ref 101–111)
CO2 TOTAL: 22 mmol/L (ref 22–32)
CREATININE: 0.85 mg/dL (ref 0.44–1.00)
ESTIMATED GFR: >60 mL/min/1.73mˆ2 (ref >60)
GLUCOSE: 115 mg/dL — ABNORMAL HIGH (ref 70–110)
POTASSIUM: 4 mmol/L (ref 3.4–5.1)
PROTEIN TOTAL: 6.5 g/dL (ref 6.4–8.3)
SODIUM: 137 mmol/L (ref 136–145)

## 2018-07-03 LAB — CBC WITH DIFF
BASOPHIL #: 0 10*3/uL (ref 0.00–0.10)
BASOPHIL %: 1 % (ref 0–3)
EOSINOPHIL #: 0.4 x10ˆ3/uL (ref 0.00–0.50)
EOSINOPHIL %: 4 % (ref 0–5)
HCT: 39 % (ref 36.0–45.0)
HGB: 13.8 g/dL (ref 12.0–15.5)
LYMPHOCYTE #: 1.8 x10ˆ3/uL (ref 1.00–4.80)
LYMPHOCYTE %: 19 % (ref 15–43)
MCH: 33.4 pg — ABNORMAL HIGH (ref 27.5–33.2)
MCHC: 35.4 g/dL (ref 32.0–36.0)
MCV: 94.5 fL (ref 82.0–97.0)
MCV: 94.5 fL (ref 82.0–97.0)
MONOCYTE #: 0.7 x10ˆ3/uL (ref 0.20–0.90)
MONOCYTE %: 7 % (ref 5–12)
MPV: 10.7 fL — ABNORMAL HIGH (ref 7.4–10.5)
NEUTROPHIL #: 6.2 x10ˆ3/uL (ref 1.50–6.50)
NEUTROPHIL %: 69 % (ref 43–76)
PLATELETS: 150 x10ˆ3/uL (ref 150–450)
RBC: 4.13 x10ˆ6/uL (ref 4.00–5.10)
RDW: 13 % (ref 11.0–16.0)
WBC: 9 10*3/uL (ref 4.0–11.0)

## 2018-07-03 LAB — VITAMIN B12: VITAMIN B 12: 265 pg/mL (ref 180–914)

## 2018-07-03 LAB — THYROID STIMULATING HORMONE WITH FREE T4 REFLEX: TSH: 2.782 u[IU]/mL (ref 0.340–5.330)

## 2018-07-03 LAB — FOLATE: FOLATE: 10.4 ng/mL (ref >=4.5)

## 2018-07-03 LAB — D-DIMER: D-DIMER: 210 ng/mL DDU (ref <=232)

## 2018-07-03 NOTE — Progress Notes (Signed)
[facility]  [address]  [place] [id number]  Phone: [phone]  Fax: [phone]    Encounter Date: 07/03/2018    Patient ID:  Toni CockerKathy Tran  [id number]:[id number]    DOB: 1956-04-27  Age: 62 y.o. female    Subjective:     Chief Complaint   Patient presents with   . Rib Pain   . Shortness of Breath     HPI   61y/o female w/ chest pain and sob that is progressive. Fatigue. DOE. Symptoms for 2wks. No coughing, no fever/chills. Coughing or sneezing makes it worse. No wheezing.   Current Outpatient Medications   Medication Sig   . amLODIPine (NORVASC) 5 mg Oral Tablet Take 1 Tab (5 mg total) by mouth Once a day AMLODIPINE BESYLATE   . aspirin 325 mg Oral Tablet Take 325 mg by mouth Once a day   . azithromycin (ZITHROMAX) 250 mg Oral Tablet Take 500 mg (2 tab) on day 1; take 250 mg (1 tab) on days 2-5.   . ergocalciferol, vitamin D2, (DRISDOL) 50,000 unit Oral Capsule Take 1 Cap (50,000 Units total) by mouth Every 7 days (Patient not taking: Reported on 07/03/2018)   . gabapentin (NEURONTIN) 300 mg Oral Capsule 3 IN THE MORNING AND 3 AT BEDTIME   . lisinopril-hydrochlorothiazide (ZESTORETIC) 20-12.5 mg Oral Tablet Take 1 Tab by mouth Twice daily   . oxyCODONE-acetaminophen (PERCOCET) 5-325 mg Oral Tablet Take 1 Tab by mouth Every 8 hours as needed for Pain   . rOPINIRole (REQUIP) 0.25 mg Oral Tablet Take 0.25 mg by mouth Per instructions Taking 2 tablets nightly   . sertraline (ZOLOFT) 100 mg Oral Tablet Take 1 Tab (100 mg total) by mouth Once a day 1.5tab po daily   . simvastatin (ZOCOR) 20 mg Oral Tablet Take 1 Tab (20 mg total) by mouth Every evening   . traZODone (DESYREL) 50 mg Oral Tablet Take 1 Tab (50 mg total) by mouth Every night   . VENTOLIN HFA 90 mcg/actuation Inhalation HFA Aerosol Inhaler Take 2 Puffs by inhalation Every 6 hours as needed (Patient not taking: Reported on 10/31/2017)     No Known Allergies  Past Medical History:   Diagnosis Date   . Anxiety    .  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 daily   . Chronic pain     right foot/right leg   . 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 Depression    . Hypercholesterolemia    . Hyperlipidemia    . Hypertension    . Obesity    . Peripheral edema     right foot   . Plantar fascial fibromatosis of right foot    . PONV (postoperative nausea and vomiting)     requests Anti Emetic   . RLS (restless legs syndrome)    . Snores     Never has had sleep study   . Tarsal tunnel syndrome, right    . Vitamin D deficiency    . Wears glasses          Past Surgical History:   Procedure Laterality Date   . HX APPENDECTOMY  childhood   . HX EYE SURGERY Bilateral Childhood    Muscle Surgery   . HX SHOULDER SURGERY Right 06/2016    Exc. Bone Spur, Repair Rotator Cuff  Tear   . HX TUBAL LIGATION  1992         Family Medical History:     Problem Relation (Age of Onset)    Alzheimer's/Dementia Mother, Father    Arthritis-osteo Mother, Father    Blood Clots Father    Congestive Heart Failure Father    Coronary Artery Disease Father    Diabetes Father    Heart Attack Mother, Father    High Cholesterol Mother, Father    Hypertension (High Blood Pressure) Mother, Father    Migraines Mother, Father    Sleep disorders Father    Stroke Mother, Father            Social History     Tobacco Use   . Smoking status: Never Smoker   . Smokeless tobacco: Never Used   Substance Use Topics   . Alcohol use: No   . Drug use: No       Review of Systems   Constitutional: Positive for fatigue.   HENT: Negative.    Eyes: Negative.    Respiratory: Positive for shortness of breath.    Cardiovascular: Positive for chest pain.   Gastrointestinal: Negative.    Musculoskeletal: Negative.    Skin: Negative.    Neurological: Negative.    Psychiatric/Behavioral: Negative.      Objective:   Vitals: Temp 36.5 C (97.7 F)   Resp 12   Ht 1.57 m (5' 1.8")   Wt 99.3  kg (219 lb)   SpO2 93%   Breastfeeding? No   BMI 40.32 kg/m         Physical Exam   Constitutional: She is oriented to person, place, and time. She appears well-developed and well-nourished.   Fatigue appearing  Appears sob   HENT:   Head: Normocephalic and atraumatic.   Eyes: Pupils are equal, round, and reactive to light.   Neck: Normal range of motion.   Cardiovascular: Normal rate, regular rhythm and normal heart sounds.   Pulmonary/Chest: Effort normal and breath sounds normal.   Abdominal: Soft.   Musculoskeletal: Normal range of motion.   Neurological: She is alert and oriented to person, place, and time.   Skin: Skin is warm.   Psychiatric: Her behavior is normal. Judgment and thought content normal.       Assessment & Plan:     ENCOUNTER DIAGNOSES     ICD-10-CM   1. SOB (shortness of breath) R06.02   2. Chest pain, unspecified type R07.9   3. Fatigue, unspecified type R53.83     cxr  labwork ordered  PFT    Orders Placed This Encounter   . XR CHEST PA & LATERAL   . COMPREHENSIVE METABOLIC PANEL, NON-FASTING   . CBC/DIFF   . D-DIMER   . FOLATE   . THYROID STIMULATING HORMONE WITH FREE T4 REFLEX   . VITAMIN B12   . VITAMIN D 25, TOTAL   . PULMONARY FUNCTION TESTING       No follow-ups on file.    [name], MD

## 2018-07-04 ENCOUNTER — Ambulatory Visit (HOSPITAL_BASED_OUTPATIENT_CLINIC_OR_DEPARTMENT_OTHER)
Admission: RE | Admit: 2018-07-04 | Discharge: 2018-07-04 | Disposition: A | Discharge status: Home / Self Care | Payer: Medicare Other | Source: Ambulatory Visit | Attending: Family Medicine | Admitting: Family Medicine

## 2018-07-04 DIAGNOSIS — R079 Chest pain, unspecified: Secondary | ICD-10-CM

## 2018-07-04 LAB — VITAMIN D 25, TOTAL: VITAMIN D, 25OH: 20 ng/mL — ABNORMAL LOW (ref 30–100)

## 2018-07-12 ENCOUNTER — Ambulatory Visit (HOSPITAL_BASED_OUTPATIENT_CLINIC_OR_DEPARTMENT_OTHER)
Admission: RE | Admit: 2018-07-12 | Discharge: 2018-07-12 | Disposition: A | Discharge status: Home / Self Care | Payer: Medicare Other | Source: Ambulatory Visit | Attending: Family Medicine | Admitting: Family Medicine

## 2018-07-12 DIAGNOSIS — R079 Chest pain, unspecified: Secondary | ICD-10-CM | POA: Insufficient documentation

## 2018-07-12 DIAGNOSIS — R0602 Shortness of breath: Secondary | ICD-10-CM

## 2018-07-12 DIAGNOSIS — R0600 Dyspnea, unspecified: Secondary | ICD-10-CM

## 2018-07-18 ENCOUNTER — Encounter (INDEPENDENT_AMBULATORY_CARE_PROVIDER_SITE_OTHER): Payer: Medicare Other | Admitting: Family Medicine

## 2018-07-25 ENCOUNTER — Telehealth (INDEPENDENT_AMBULATORY_CARE_PROVIDER_SITE_OTHER): Payer: Self-pay | Admitting: Family Medicine

## 2018-07-25 NOTE — Telephone Encounter (Signed)
Sertraline refilled at [facility], [place].  Sharyon CableGloria Eleena Grater, MA  07/25/2018, 14:08

## 2018-08-30 ENCOUNTER — Telehealth (INDEPENDENT_AMBULATORY_CARE_PROVIDER_SITE_OTHER): Payer: Self-pay | Admitting: Family Medicine

## 2018-08-30 NOTE — Telephone Encounter (Signed)
zoloft refilled.  Sharyon CableGloria Krisy Dix, MA  08/30/2018, 16:16

## 2018-08-31 ENCOUNTER — Encounter: Payer: Self-pay | Admitting: Foot & Ankle Surgery

## 2018-08-31 DIAGNOSIS — S86011A Strain of right Achilles tendon, initial encounter: Secondary | ICD-10-CM

## 2018-09-12 ENCOUNTER — Ambulatory Visit
Admission: RE | Admit: 2018-09-12 | Discharge: 2018-09-12 | Disposition: A | Discharge status: Home / Self Care | Payer: No Typology Code available for payment source | Source: Ambulatory Visit | Attending: Foot & Ankle Surgery | Admitting: Foot & Ankle Surgery

## 2018-09-12 DIAGNOSIS — M7731 Calcaneal spur, right foot: Secondary | ICD-10-CM | POA: Insufficient documentation

## 2018-09-12 DIAGNOSIS — M722 Plantar fascial fibromatosis: Secondary | ICD-10-CM | POA: Insufficient documentation

## 2018-09-12 DIAGNOSIS — S86011A Strain of right Achilles tendon, initial encounter: Secondary | ICD-10-CM | POA: Insufficient documentation

## 2018-09-12 IMAGING — DX DG ANKLE COMPLETE 3+V*R*
3 series · 3 of 3 positions shown · non-contrast
Comparison: None in PACs

CLINICAL DATA: Right ankle pain and stiffness for the past 2 weeks.
The patient noticed a knot in the soft tissues posteriorly above the
heel for the past 2 weeks as well. Painful range of motion. No known
injury

EXAM:
RIGHT ANKLE - COMPLETE 3+ VIEW

[ankle ap]
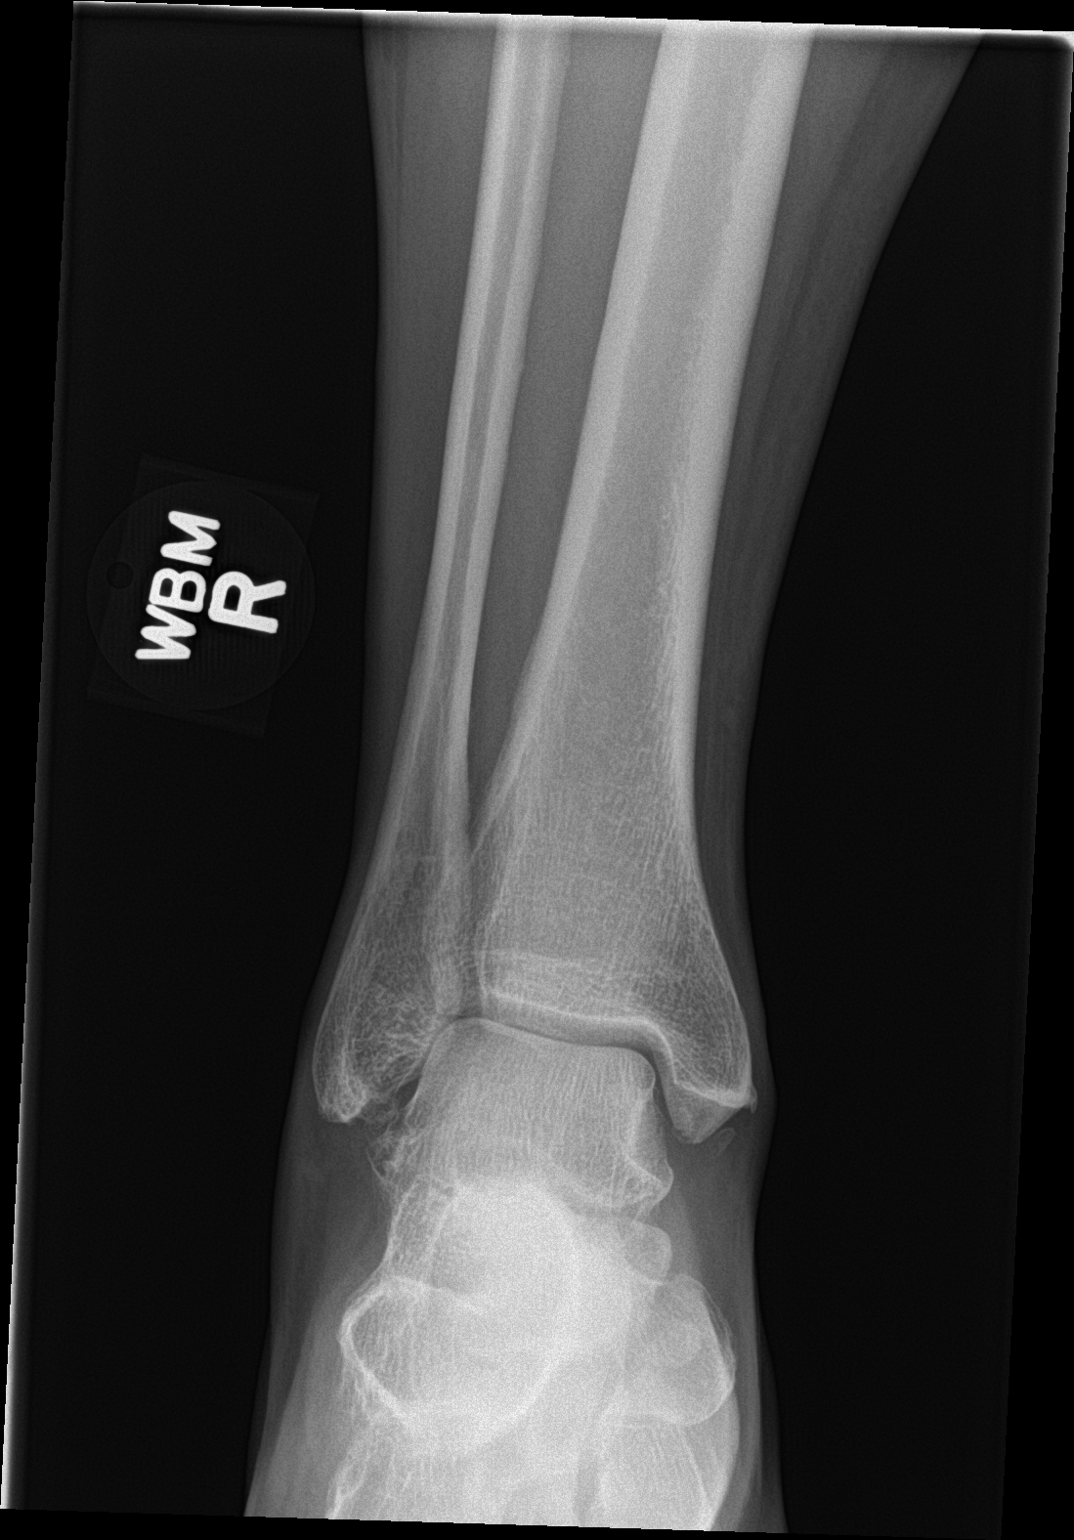

[ankle obl]
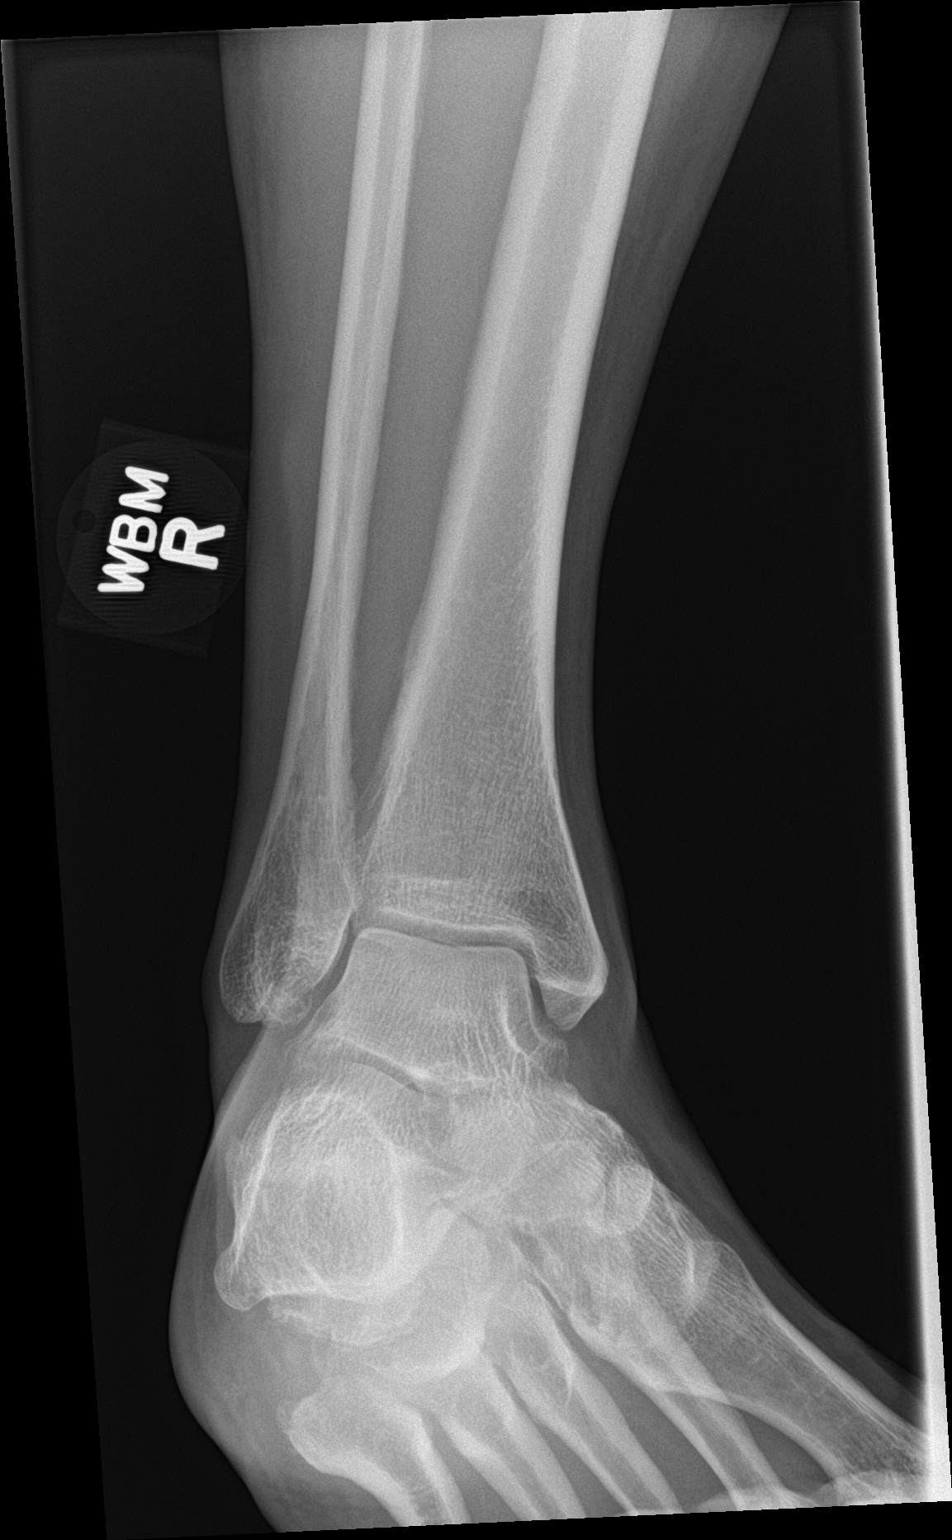

[ankle lat]
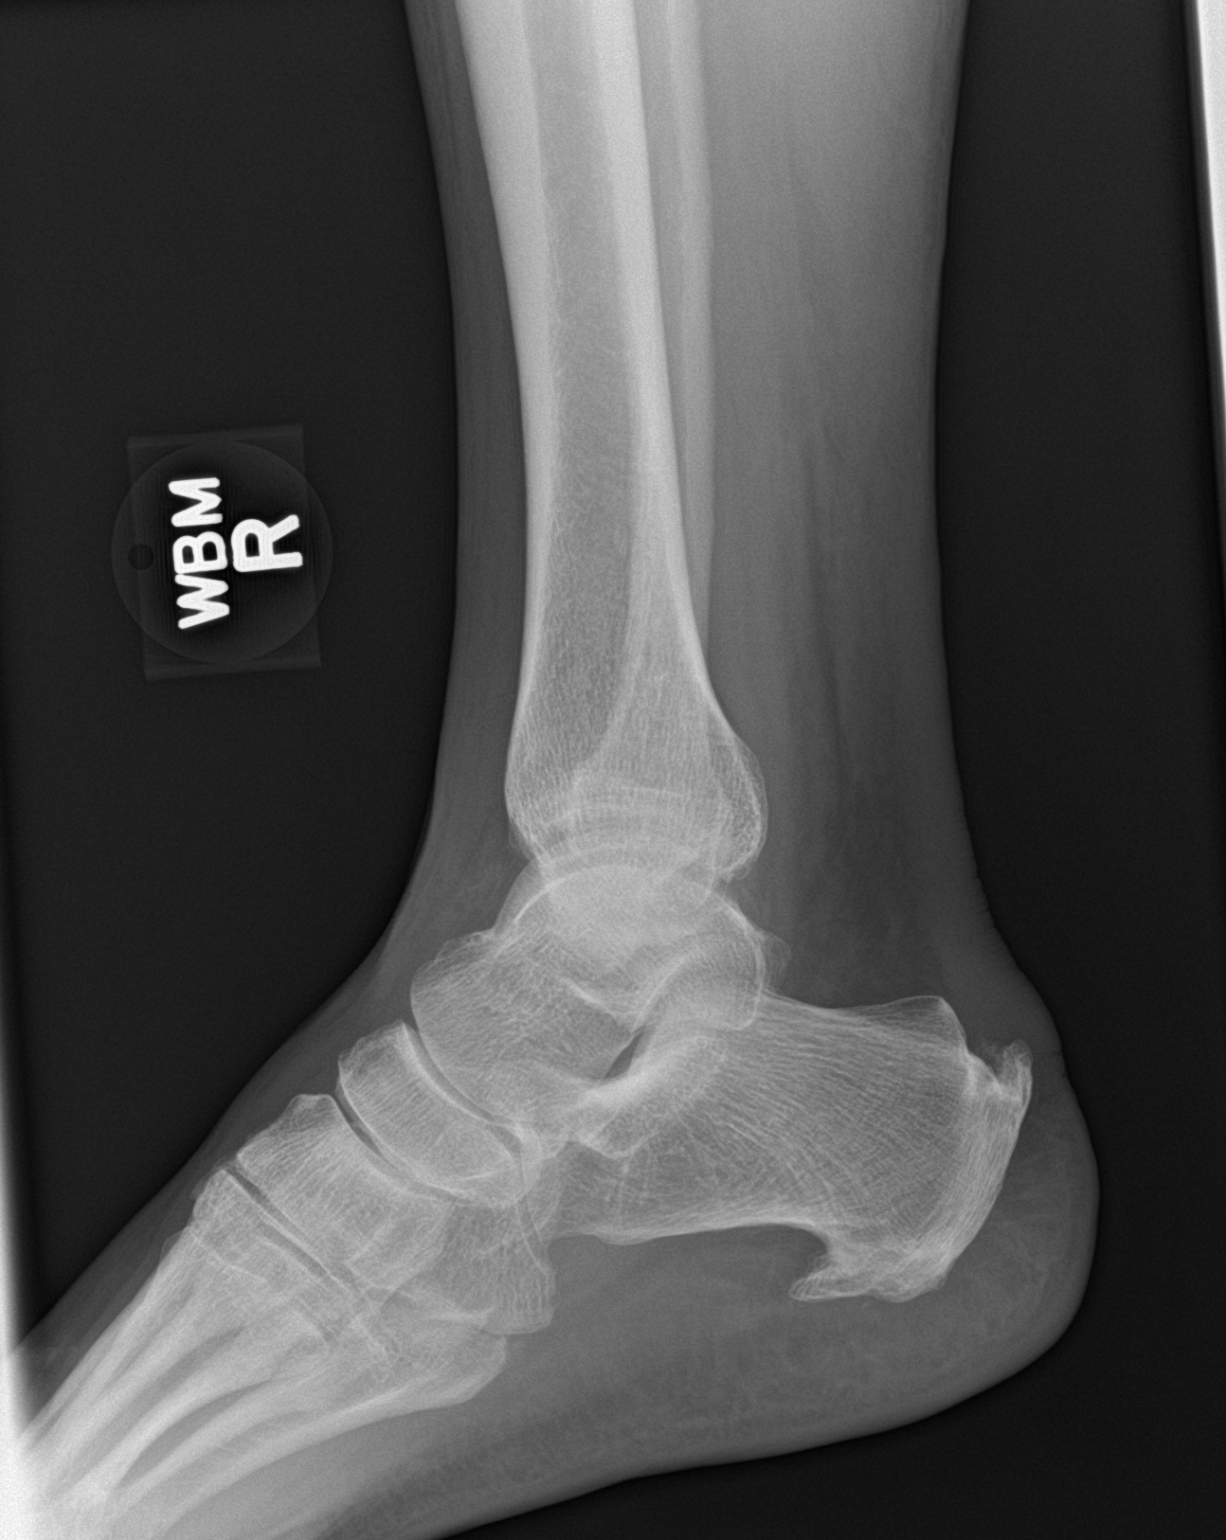

[3 of 3 positions shown; findings below may reference images not displayed]

FINDINGS: The bones are subjectively adequately mineralized. Small spurs arise
from the a distal aspects of the malleoli bilaterally. The joint
mortise is preserved. The talar dome is intact. There are large
plantar and Achilles region calcaneal spurs. There is mild soft
tissue swelling over the Achilles region spur which may reflect the
clinically palpable knot. The other hindfoot bones exhibit no acute
abnormality.
IMPRESSION: 1. No acute bony abnormality of the right ankle. Mild degenerative
spurring of the malleolar lie.
2. Large plantar and Achilles region calcaneal spurs. Mild soft
tissue swelling over the Achilles region spur likely reflects the
palpable knot.

## 2018-10-03 ENCOUNTER — Encounter: Payer: Self-pay | Admitting: Foot & Ankle Surgery

## 2018-10-05 ENCOUNTER — Encounter: Payer: Self-pay | Admitting: Foot & Ankle Surgery

## 2018-10-24 ENCOUNTER — Encounter (INDEPENDENT_AMBULATORY_CARE_PROVIDER_SITE_OTHER): Payer: Self-pay | Admitting: Family Medicine

## 2018-10-24 ENCOUNTER — Ambulatory Visit (INDEPENDENT_AMBULATORY_CARE_PROVIDER_SITE_OTHER): Payer: Medicare Other | Admitting: Family Medicine

## 2018-10-24 ENCOUNTER — Ambulatory Visit: Payer: Medicare Other | Attending: Family Medicine

## 2018-10-24 VITALS — BP 86/61 | HR 67 | Temp 97.7°F | Resp 12 | Ht 61.8 in | Wt 226.8 lb

## 2018-10-24 DIAGNOSIS — Z01818 Encounter for other preprocedural examination: Secondary | ICD-10-CM

## 2018-10-24 DIAGNOSIS — G4700 Insomnia, unspecified: Secondary | ICD-10-CM

## 2018-10-24 DIAGNOSIS — Z7982 Long term (current) use of aspirin: Secondary | ICD-10-CM | POA: Insufficient documentation

## 2018-10-24 DIAGNOSIS — F32A Depression, unspecified: Secondary | ICD-10-CM

## 2018-10-24 DIAGNOSIS — Z79891 Long term (current) use of opiate analgesic: Secondary | ICD-10-CM | POA: Insufficient documentation

## 2018-10-24 DIAGNOSIS — Z79899 Other long term (current) drug therapy: Secondary | ICD-10-CM | POA: Insufficient documentation

## 2018-10-24 DIAGNOSIS — Z01812 Encounter for preprocedural laboratory examination: Secondary | ICD-10-CM | POA: Insufficient documentation

## 2018-10-24 DIAGNOSIS — F329 Major depressive disorder, single episode, unspecified: Secondary | ICD-10-CM

## 2018-10-24 LAB — BASIC METABOLIC PANEL
ANION GAP: 8 mmol/L (ref 3–11)
BUN/CREA RATIO: 18 (ref 6–22)
BUN: 17 mg/dL (ref 6–20)
CALCIUM: 9.5 mg/dL (ref 8.8–10.2)
CHLORIDE: 102 mmol/L (ref 101–111)
CO2 TOTAL: 27 mmol/L (ref 22–32)
CREATININE: 0.97 mg/dL (ref 0.44–1.00)
ESTIMATED GFR: >60 mL/min/1.73mˆ2 (ref >60)
GLUCOSE: 111 mg/dL — ABNORMAL HIGH (ref 70–110)
POTASSIUM: 4.3 mmol/L (ref 3.4–5.1)
SODIUM: 137 mmol/L (ref 136–145)

## 2018-10-24 LAB — CBC WITH DIFF
BASOPHIL #: 0.1 10*3/uL (ref 0.00–0.10)
BASOPHIL #: 0.1 x10ˆ3/uL (ref 0.00–0.10)
BASOPHIL %: 1 % (ref 0–3)
EOSINOPHIL #: 0.3 10*3/uL (ref 0.00–0.50)
EOSINOPHIL %: 3 % (ref 0–5)
HCT: 39.2 % (ref 36.0–45.0)
HGB: 13.8 g/dL (ref 12.0–15.5)
LYMPHOCYTE #: 1.6 10*3/uL (ref 1.00–4.80)
LYMPHOCYTE #: 1.6 x10ˆ3/uL (ref 1.00–4.80)
LYMPHOCYTE %: 15 % (ref 15–43)
MCH: 33.5 pg — ABNORMAL HIGH (ref 27.5–33.2)
MCHC: 35.2 g/dL (ref 32.0–36.0)
MCV: 95.4 fL (ref 82.0–97.0)
MONOCYTE #: 0.8 x10ˆ3/uL (ref 0.20–0.90)
MONOCYTE %: 7 % (ref 5–12)
MPV: 10.3 fL (ref 7.4–10.5)
NEUTROPHIL #: 7.9 x10ˆ3/uL — ABNORMAL HIGH (ref 1.50–6.50)
NEUTROPHIL %: 74 % (ref 43–76)
NEUTROPHIL %: 74 % (ref 43–76)
PLATELETS: 175 x10ˆ3/uL (ref 150–450)
RBC: 4.11 x10ˆ6/uL (ref 4.00–5.10)
RDW: 12.8 % (ref 11.0–16.0)
WBC: 10.6 x10ˆ3/uL (ref 4.0–11.0)

## 2018-10-24 MED ORDER — TRAZODONE 50 MG TABLET
50.00 mg | ORAL_TABLET | Freq: Every evening | ORAL | 2 refills | Status: DC
Start: 2018-10-24 — End: 2019-01-30

## 2018-10-24 MED ORDER — FLUOXETINE 20 MG CAPSULE
20.0000 mg | ORAL_CAPSULE | Freq: Every day | ORAL | 2 refills | Status: DC
Start: 2018-10-24 — End: 2019-01-30

## 2018-10-24 NOTE — Progress Notes (Signed)
[facility]  [address]  [place] [address]  Phone: [phone]  Fax: [phone]    Encounter Date: 10/24/2018    Patient ID:  Toni Tran  [id number]    DOB: 19-Jul-1956  Age: 62 y.o. female    Subjective:     Chief Complaint   Patient presents with   . Pre-op     foot surgery     HPI   Achilles tendon surgery on dec 4th by Dr Rollene Rotunda. Right heel surgery and some pain w/ use. No chest pain or sob. Needs medical clearance for surgery.  Current Outpatient Medications   Medication Sig   . amLODIPine (NORVASC) 5 mg Oral Tablet Take 1 Tab (5 mg total) by mouth Once a day AMLODIPINE BESYLATE   . aspirin 325 mg Oral Tablet Take 325 mg by mouth Once a day   . azithromycin (ZITHROMAX) 250 mg Oral Tablet Take 500 mg (2 tab) on day 1; take 250 mg (1 tab) on days 2-5.   . ergocalciferol, vitamin D2, (DRISDOL) 50,000 unit Oral Capsule Take 1 Cap (50,000 Units total) by mouth Every 7 days   . gabapentin (NEURONTIN) 300 mg Oral Capsule 3 IN THE MORNING AND 3 AT BEDTIME   . lisinopril-hydrochlorothiazide (ZESTORETIC) 20-12.5 mg Oral Tablet Take 1 Tab by mouth Twice daily   . oxyCODONE-acetaminophen (PERCOCET) 5-325 mg Oral Tablet Take 1 Tab by mouth Every 8 hours as needed for Pain   . rOPINIRole (REQUIP) 0.25 mg Oral Tablet Take 0.25 mg by mouth Per instructions Taking 2 tablets nightly   . sertraline (ZOLOFT) 100 mg Oral Tablet Take 1 Tab (100 mg total) by mouth Once a day 1.5tab po daily   . simvastatin (ZOCOR) 20 mg Oral Tablet Take 1 Tab (20 mg total) by mouth Every evening   . traZODone (DESYREL) 50 mg Oral Tablet Take 1 Tab (50 mg total) by mouth Every night   . VENTOLIN HFA 90 mcg/actuation Inhalation HFA Aerosol Inhaler Take 2 Puffs by inhalation Every 6 hours as needed (Patient not taking: Reported on 10/31/2017)     No Known Allergies  Past Medical History:   Diagnosis Date   . Anxiety    . 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  daily   . Chronic pain     right foot/right leg   . 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 Depression    . Hypercholesterolemia    . Hyperlipidemia    . Hypertension    . Obesity    . Peripheral edema     right foot   . Plantar fascial fibromatosis of right foot    . PONV (postoperative nausea and vomiting)     requests Anti Emetic   . RLS (restless legs syndrome)    . Snores     Never has had sleep study   . Tarsal tunnel syndrome, right    . Vitamin D deficiency    . Wears glasses          Past Surgical History:   Procedure Laterality Date   . HX APPENDECTOMY  childhood   . HX EYE SURGERY Bilateral Childhood    Muscle Surgery   . HX SHOULDER SURGERY Right 06/2016    Exc. Bone Spur, Repair Rotator Cuff Tear   . HX TUBAL LIGATION  60         Family Medical History:     Problem Relation (Age of Onset)    Alzheimer's/Dementia Mother, Father    Arthritis-osteo Mother, Father    Blood Clots Father    Congestive Heart Failure Father    Coronary Artery Disease Father    Diabetes Father    Heart Attack Mother, Father    High Cholesterol Mother, Father    Hypertension (High Blood Pressure) Mother, Father    Migraines Mother, Father    Sleep disorders Father    Stroke Mother, Father            Social History     Tobacco Use   . Smoking status: Never Smoker   . Smokeless tobacco: Never Used   Substance Use Topics   . Alcohol use: No   . Drug use: No       Review of Systems   Constitutional: Positive for fatigue.   HENT: Negative.    Eyes: Negative.    Respiratory: Negative.    Cardiovascular: Negative.    Gastrointestinal: Negative.    Musculoskeletal: Negative.    Skin: Negative.    Neurological: Negative.    Psychiatric/Behavioral: Negative.      Objective:   Vitals: BP (!) 86/61   Pulse 67   Temp 36.5 C (97.7 F)   Resp 12   Ht 1.57 m (5' 1.8")   Wt 102.9 kg (226 lb 12.8 oz)   SpO2 97%   Breastfeeding? No   BMI 41.75 kg/m               Physical Exam   Constitutional: She is oriented to person, place, and time. She appears well-developed and well-nourished.   Obese   Fatigue appearing   HENT:   Head: Normocephalic and atraumatic.   Right Ear: External ear normal.   Left Ear: External ear normal.   Eyes: Pupils are equal, round, and reactive to light. EOM are normal.   Neck: Normal range of motion.   Cardiovascular: Normal rate, regular rhythm and normal heart sounds.   Pulmonary/Chest: Effort normal and breath sounds normal.   Abdominal: Soft. Bowel sounds are normal.   Musculoskeletal: Normal range of motion.   Neurological: She is alert and oriented to person, place, and time.   Skin: Skin is warm.   Psychiatric: Her behavior is normal. Judgment and thought content normal.       Assessment & Plan:     ENCOUNTER DIAGNOSES     ICD-10-CM   1. Preoperative clearance Z01.818     labwork ordered  ekg done, no new changes to ekg  Medically cleared for surgery  Orders Placed This Encounter   . BASIC METABOLIC PANEL   . CBC/DIFF   . EKG-Office Performed       No follow-ups on file.    [name], MD

## 2018-10-26 ENCOUNTER — Telehealth: Payer: No Typology Code available for payment source

## 2018-11-01 ENCOUNTER — Telehealth: Payer: No Typology Code available for payment source

## 2018-11-01 NOTE — Pre-Procedure Instructions (Signed)
SPECIAL INSTRUCTIONS FOR THE [employer] (pss nurse fill in what is applicable to the patient):  CONTACT ISOLATION:     TRANSLATOR REQUESTED:    SPECIAL INSTRUCTIONS:    TRANSPORTATION:        TESTING - DAY OF SURGERY:  fbs per anesthesia guidelines for bmi >40  PREGNANCY TEST WAIVERS:    ABNORMAL LABS:        SPECIAL CONSIDERATIONS-BLOOD PRODUCTS:    SPECIAL NEEDS FOR PATIENT - BEHAVIORAL /SENSORY:       PLEASE DO NOT REPLY TO THIS EMAIL. IF YOU HAVE ANY QUESTIONS, PLEASE CONTACT THE PREOP INTERVIEW OFFICE AT [phone] OR [phone]   MONDAY - FRIDAY.      Date of Procedure: 12/4    Arrival Time: 115pm    Procedure Time: 245pm     Someone from the hospital will call you after 4pm the business day prior to your scheduled procedure to verify the arrival and procedure times listed above. The purpose of this call is to advise you of any scheduling changes that may have occurred since the time of your telephone interview with the pre surgical services nurse.    NOTE FROM THE ANESTHESIA DEPARTMENT:  Please note that your procedure may be cancelled or rescheduled if you do not complete the required testing, preop clearances and/or follow the medication instructions that are required by your surgeon and /or the anesthesia department.    Requirements (i.e.: testing, etc.) You Must Complete Prior to Your Procedure :(pss nurse fmark an "X" what is applicable to the patient)    (   ) MSSA/MRSA Testing (Must be completed within 3 weeks of your procedure date):   The [facility] Outpatient Lab is open from 8:00am - 4:30pm Monday through Friday and no appointment is necessary.  You will need to bring your photo ID and insurance card when you come.      (x   ) Shower with Chlorhexidine (Hibiclens) wash prior to your procedure.  An instruction sheet detailing how you should use this product is attached to this email.     ( x ) Eating: You should not have anything to eat after  5am.    ( x  ) Drinking: Drink "clear" liquids  until  9am .    CLEAR LIQUIDS INCLUDE: WATER, BLACK COFFEE/TEA (NO MILK, CREAM, NON-DAIRY CREAMER OR SUGAR), CARBONATED BEVERAGES, JUICES WITHOUT PULP (APPLE, CRANBERRY, GRAPE) AND GATORADE.     (   ) CLEAR ENSURE DRINK :  Finish 1 bottle of Clear Ensure prior to the time you were instructed to stop all clear fluids.  (this should be the last drink you consume before you discontinue your fluid intake).      (   ) GATORADE G2:  For Diabetics: You should not drink the clear ensure carbohydrate drink if your blood sugars consistently run greater than 200, as it may elevate your blood sugar. You may substitute with GATORADE G2.      Medication Instructions:    No aspirin one week prior to surgery  No lisinopril-hctz on the morning of surgery  Take amlodipine the morning of surgery  Take trazadone the night prior to surgery  Take fluoxetine the morning of surgery  Take simvistatin the morning of surgery     YOU SHOULD NOT TAKE IBUPROFEN, MOTRIN, ADVIL, ALEVE, NSAIDS AS OF 11/27.     YOU MAY TAKE TYLENOL AS NEEDED.    Hospital Address and Arrival Instructions:   [phone] [facility]  [address] [place], [place] [address]  The [facility] is where you will enter.  A complimentary valet is available at the front [place] entrance of the hospital.  Please call [phone] morning of surgery if you need assistance upon arrival.    Upon arrival in the hospital main lobby via [employer], you will proceed to:  - Patient Registration, which is all the way down the hall on the left.  - Once registration is completed, you will be escorted by the registration staff to the Surgical Services Waiting Area, where you wait until a PreOp clinical team member escorts you to a room and initiates the PreOp process.  - You will need to have a valid photo I.D., insurance card and co-pay form of payment if required by insurance.    Pre Surgical General Instructions:  1. Bathe or shower the morning of the procedure with an anti-bacterial soap  before arriving (unless instructed to use Chlorhexidine (CHG) or Hibiclens).   2. Do not apply lotion, perfume, cologne, or hair-care products such as hair spray or gels.  3. Do not shave your surgical site at home.  4. Do not wear makeup, jewelry including body piercing, watches, earrings, or rings.   5. You may brush your teeth and gargle on the morning of surgery but do not swallow any water.  6. Wear casual, loose fitting and comfortable clothing. A gown will be provided.  7. Plan to leave unnecessary valuables, credit cards (except for co-pay payment use) and jewelry at home or with a companion on day of surgery for safe keeping. The hospital is not responsible for lost/stolen items.  8. If you wear contacts please leave them at home. If you wear glasses, please bring a case.  9. Dentures - we will provide a container  10. Hearing aids, you may bring dos but you will be asked to remove them before surgery.  11. Please arrange for someone to drive you home. For your safety you will not be allowed to drive home after sedation or anesthesia. A responsible adult must be present to accompany you home when you are ready to leave. We strongly recommend that all patients have an adult at home with them for the first 24 hours after surgery.  12. Notify your doctor if you develop any sign of illness before the date of your surgery. Report symptoms such as: high fever, sore throat, or other infection, breathing difficulties or chest pain.  13. Discontinue herbal supplements and herbal/green tea one week prior to surgery.    Below is a link/web address to the Preparing for Your Procedure video that walks you through the surgical experience.   [email or web address]    Possible Anesthesia Side Effects:  . Nausea and vomiting. This common side effect usually occurs immediately after the procedure, but some people may continue to feel sick for a day or two. Anti-nausea medicines can help.  . Dry mouth.  You may feel parched when you wake up. As long as you're not too nauseated, sipping water can help take care of your dry mouth.  . Sore throat or hoarseness. The tube put in your throat to help you breathe during surgery can leave you with a sore throat after it's removed.  . Chills and shivering. It's common for your body temperature to drop during general anesthesia. Your doctors and nurses will make sure your temperature doesn't fall too much during surgery, but you may wake up shivering and feeling cold. Your chills may  last for a few minutes to hours.  . Confusion and fuzzy thinking. When first waking from anesthesia, you may feel confused, drowsy, and foggy. This usually lasts for just a few hours, but for some people - especially older adults - confusion can last for days or weeks.  . Muscle aches. The drugs used to relax your muscles during surgery can cause soreness afterward.  . Itching. If narcotic (opioid) medications are used during or after your operation, you may be itchy. This is a common side effect of this class of drugs.  . Bladder problems. You may have difficulty passing urine for a short time after general anesthesia.  . Dizziness. You may feel dizzy when you first stand up. Drinking plenty of fluids should help you feel better.

## 2018-11-12 NOTE — Anesthesia Preprocedure Evaluation (Addendum)
Anesthesia Evaluation    AIRWAY    Mallampati: II    TM distance: >3 FB  Neck ROM: limited  Mouth Opening:limited   CARDIOVASCULAR    cardiovascular exam normal       DENTAL    no notable dental hx       PULMONARY    pulmonary exam normal     OTHER FINDINGS                70F  HTN  PONV  S/P CVA (2013), R sided weakness, walks with assistance  Depression  Labs acceptable  EKG: SR with RBBB, borderline  Medical clearance on chart: optimized for surgery      Relevant Problems   No relevant active problems               Anesthesia Plan    ASA 2     general                     intravenous induction   Detailed anesthesia plan: PNB and general LMA        Post op pain management: per surgeon    informed consent obtained                   Signed by: Mindi Junker 11/12/18 1:50 PM

## 2018-11-15 ENCOUNTER — Ambulatory Visit: Payer: No Typology Code available for payment source

## 2018-11-15 ENCOUNTER — Ambulatory Visit
Admission: RE | Admit: 2018-11-15 | Discharge: 2018-11-16 | Disposition: A | Discharge status: Home / Self Care | Payer: No Typology Code available for payment source | Source: Ambulatory Visit | Attending: Foot & Ankle Surgery | Admitting: Foot & Ankle Surgery

## 2018-11-15 ENCOUNTER — Inpatient Hospital Stay: Payer: No Typology Code available for payment source

## 2018-11-15 ENCOUNTER — Ambulatory Visit: Payer: No Typology Code available for payment source | Admitting: Anesthesiology

## 2018-11-15 ENCOUNTER — Encounter
Admission: RE | Disposition: A | Discharge status: Home / Self Care | Payer: Self-pay | Source: Ambulatory Visit | Attending: Foot & Ankle Surgery

## 2018-11-15 DIAGNOSIS — X58XXXA Exposure to other specified factors, initial encounter: Secondary | ICD-10-CM | POA: Insufficient documentation

## 2018-11-15 DIAGNOSIS — R0602 Shortness of breath: Secondary | ICD-10-CM | POA: Diagnosis not present

## 2018-11-15 DIAGNOSIS — I10 Essential (primary) hypertension: Secondary | ICD-10-CM | POA: Diagnosis present

## 2018-11-15 DIAGNOSIS — I69351 Hemiplegia and hemiparesis following cerebral infarction affecting right dominant side: Secondary | ICD-10-CM | POA: Insufficient documentation

## 2018-11-15 DIAGNOSIS — Z7982 Long term (current) use of aspirin: Secondary | ICD-10-CM | POA: Insufficient documentation

## 2018-11-15 DIAGNOSIS — M66371 Spontaneous rupture of flexor tendons, right ankle and foot: Secondary | ICD-10-CM | POA: Diagnosis present

## 2018-11-15 DIAGNOSIS — J189 Pneumonia, unspecified organism: Secondary | ICD-10-CM | POA: Diagnosis present

## 2018-11-15 DIAGNOSIS — R110 Nausea: Secondary | ICD-10-CM | POA: Diagnosis present

## 2018-11-15 DIAGNOSIS — S86011A Strain of right Achilles tendon, initial encounter: Secondary | ICD-10-CM | POA: Insufficient documentation

## 2018-11-15 DIAGNOSIS — E782 Mixed hyperlipidemia: Secondary | ICD-10-CM | POA: Insufficient documentation

## 2018-11-15 DIAGNOSIS — M25571 Pain in right ankle and joints of right foot: Secondary | ICD-10-CM

## 2018-11-15 HISTORY — PX: ACHILLES TENDON REPAIR: SUR1153

## 2018-11-15 HISTORY — DX: Nausea with vomiting, unspecified: R11.2

## 2018-11-15 HISTORY — DX: Hyperlipidemia, unspecified: E78.5

## 2018-11-15 HISTORY — DX: Cerebral infarction, unspecified: I63.9

## 2018-11-15 HISTORY — DX: Essential (primary) hypertension: I10

## 2018-11-15 HISTORY — DX: Depression, unspecified: F32.A

## 2018-11-15 HISTORY — DX: Other specified postprocedural states: Z98.890

## 2018-11-15 LAB — BASIC METABOLIC PANEL
Anion Gap: 10 (ref 5.0–15.0)
BUN: 24.1 mg/dL — ABNORMAL HIGH (ref 7.0–19.0)
CO2: 25 mEq/L (ref 22–29)
Calcium: 9.1 mg/dL (ref 8.5–10.5)
Chloride: 103 mEq/L (ref 100–111)
Creatinine: 0.9 mg/dL (ref 0.6–1.0)
Glucose: 152 mg/dL — ABNORMAL HIGH (ref 70–100)
Potassium: 4.2 mEq/L (ref 3.5–5.1)
Sodium: 138 mEq/L (ref 136–145)

## 2018-11-15 LAB — GLUCOSE WHOLE BLOOD - POCT: Whole Blood Glucose POCT: 107 mg/dL — ABNORMAL HIGH (ref 70–100)

## 2018-11-15 LAB — CBC
Absolute NRBC: 0 10*3/uL (ref 0.00–0.00)
Hematocrit: 35.7 % (ref 34.7–43.7)
Hgb: 12.6 g/dL (ref 11.4–14.8)
MCH: 32.9 pg (ref 25.1–33.5)
MCHC: 35.3 g/dL (ref 31.5–35.8)
MCV: 93.2 fL (ref 78.0–96.0)
MPV: 11.4 fL (ref 8.9–12.5)
Nucleated RBC: 0 /100 WBC (ref 0.0–0.0)
Platelets: 149 10*3/uL (ref 142–346)
RBC: 3.83 10*6/uL — ABNORMAL LOW (ref 3.90–5.10)
RDW: 12 % (ref 11–15)
WBC: 12.46 10*3/uL — ABNORMAL HIGH (ref 3.10–9.50)

## 2018-11-15 LAB — LACTIC ACID, PLASMA: Lactic Acid: 1.1 mmol/L (ref 0.2–2.0)

## 2018-11-15 LAB — GFR: EGFR: >60

## 2018-11-15 SURGERY — PRIMARY REPAIR, ACHILLES TENDON
Anesthesia: Anesthesia General | Site: Ankle | Laterality: Right | Wound class: Clean

## 2018-11-15 MED ORDER — ONDANSETRON HCL 4 MG/2ML IJ SOLN
4.00 mg | Freq: Four times a day (QID) | INTRAMUSCULAR | Status: DC | PRN
Start: 2018-11-15 — End: 2018-11-16

## 2018-11-15 MED ORDER — FENTANYL CITRATE (PF) 50 MCG/ML IJ SOLN (WRAP)
INTRAMUSCULAR | Status: DC | PRN
Start: 2018-11-15 — End: 2018-11-15
  Administered 2018-11-15 (×2): 50 ug via INTRAVENOUS

## 2018-11-15 MED ORDER — LACTATED RINGERS IV SOLN
INTRAVENOUS | Status: DC
Start: 2018-11-15 — End: 2018-11-16
  Administered 2018-11-15: 14:00:00 1000 mL via INTRAVENOUS

## 2018-11-15 MED ORDER — ONDANSETRON HCL 4 MG/2ML IJ SOLN
INTRAMUSCULAR | Status: AC
Start: 2018-11-15 — End: ?
  Filled 2018-11-15: qty 2

## 2018-11-15 MED ORDER — PROMETHAZINE HCL 25 MG/ML IJ SOLN
INTRAMUSCULAR | Status: AC
Start: 2018-11-15 — End: 2018-11-15
  Administered 2018-11-15: 18:00:00 6.25 mg via INTRAVENOUS
  Filled 2018-11-15: qty 1

## 2018-11-15 MED ORDER — IBUPROFEN 400 MG PO TABS
400.0000 mg | ORAL_TABLET | Freq: Three times a day (TID) | ORAL | Status: DC | PRN
Start: 2018-11-15 — End: 2018-11-16

## 2018-11-15 MED ORDER — HYDROMORPHONE HCL 2 MG PO TABS
2.0000 mg | ORAL_TABLET | ORAL | Status: DC | PRN
Start: 2018-11-15 — End: 2018-11-16
  Administered 2018-11-16: 2 mg via ORAL
  Filled 2018-11-15: qty 1

## 2018-11-15 MED ORDER — ENOXAPARIN SODIUM 40 MG/0.4ML SC SOLN
40.00 mg | Freq: Every day | SUBCUTANEOUS | Status: DC
Start: 2018-11-15 — End: 2018-11-16
  Administered 2018-11-15 – 2018-11-16 (×2): 40 mg via SUBCUTANEOUS
  Filled 2018-11-15 (×2): qty 0.4

## 2018-11-15 MED ORDER — FENTANYL CITRATE (PF) 50 MCG/ML IJ SOLN (WRAP)
INTRAMUSCULAR | Status: AC
Start: 2018-11-15 — End: ?
  Filled 2018-11-15: qty 2

## 2018-11-15 MED ORDER — ONDANSETRON 4 MG PO TBDP
4.00 mg | ORAL_TABLET | Freq: Four times a day (QID) | ORAL | Status: DC | PRN
Start: 2018-11-15 — End: 2018-11-16

## 2018-11-15 MED ORDER — OXYCODONE-ACETAMINOPHEN 5-325 MG PO TABS
1.0000 | ORAL_TABLET | Freq: Four times a day (QID) | ORAL | Status: DC | PRN
Start: 2018-11-15 — End: 2018-11-16

## 2018-11-15 MED ORDER — DEXAMETHASONE SOD PHOSPHATE PF 10 MG/ML IJ SOLN
INTRAMUSCULAR | Status: AC
Start: 2018-11-15 — End: ?
  Filled 2018-11-15: qty 1

## 2018-11-15 MED ORDER — NALOXONE HCL 0.4 MG/ML IJ SOLN (WRAP)
0.20 mg | INTRAMUSCULAR | Status: DC | PRN
Start: 2018-11-15 — End: 2018-11-16

## 2018-11-15 MED ORDER — PROPOFOL 10 MG/ML IV EMUL (WRAP)
INTRAVENOUS | Status: AC
Start: 2018-11-15 — End: ?
  Filled 2018-11-15: qty 20

## 2018-11-15 MED ORDER — HYDROMORPHONE HCL 1 MG/ML IJ SOLN
0.5000 mg | INTRAMUSCULAR | Status: DC | PRN
Start: 2018-11-15 — End: 2018-11-15

## 2018-11-15 MED ORDER — ROPIVACAINE HCL 5 MG/ML IJ SOLN
INTRAMUSCULAR | Status: AC
Start: 2018-11-15 — End: ?
  Filled 2018-11-15: qty 30

## 2018-11-15 MED ORDER — OXYCODONE-ACETAMINOPHEN 5-325 MG PO TABS
1.0000 | ORAL_TABLET | ORAL | 0 refills | Status: AC | PRN
Start: 2018-11-15 — End: 2018-11-22

## 2018-11-15 MED ORDER — HYDROMORPHONE HCL 0.5 MG/0.5 ML IJ SOLN
INTRAMUSCULAR | Status: AC
Start: 2018-11-15 — End: 2018-11-15
  Administered 2018-11-15: 0.5 mg via INTRAVENOUS
  Filled 2018-11-15: qty 0.5

## 2018-11-15 MED ORDER — LIDOCAINE HCL (PF) 2 % IJ SOLN
INTRAMUSCULAR | Status: AC
Start: 2018-11-15 — End: ?
  Filled 2018-11-15: qty 5

## 2018-11-15 MED ORDER — CEFAZOLIN SODIUM 1 G IJ SOLR
INTRAMUSCULAR | Status: AC
Start: 2018-11-15 — End: ?
  Filled 2018-11-15: qty 1000

## 2018-11-15 MED ORDER — ONDANSETRON HCL 4 MG/2ML IJ SOLN
INTRAMUSCULAR | Status: DC | PRN
Start: 2018-11-15 — End: 2018-11-15
  Administered 2018-11-15: 4 mg via INTRAVENOUS

## 2018-11-15 MED ORDER — AMMONIA AROMATIC IN INHA
1.0000 | Freq: Once | RESPIRATORY_TRACT | Status: DC | PRN
Start: 2018-11-15 — End: 2018-11-15

## 2018-11-15 MED ORDER — PROMETHAZINE HCL 25 MG/ML IJ SOLN
6.2500 mg | Freq: Once | INTRAMUSCULAR | Status: AC | PRN
Start: 2018-11-15 — End: 2018-11-15

## 2018-11-15 MED ORDER — DEXAMETHASONE SODIUM PHOSPHATE 4 MG/ML IJ SOLN (WRAP)
INTRAMUSCULAR | Status: DC | PRN
Start: 2018-11-15 — End: 2018-11-15
  Administered 2018-11-15: 4 mg via INTRAVENOUS

## 2018-11-15 MED ORDER — LIDOCAINE HCL 2 % IJ SOLN
INTRAMUSCULAR | Status: DC | PRN
Start: 2018-11-15 — End: 2018-11-15
  Administered 2018-11-15: 100 mg via INTRAVENOUS

## 2018-11-15 MED ORDER — ACETAMINOPHEN 325 MG PO TABS
650.0000 mg | ORAL_TABLET | Freq: Four times a day (QID) | ORAL | Status: DC | PRN
Start: 2018-11-15 — End: 2018-11-16

## 2018-11-15 MED ORDER — DEXTROSE 5 % IV SOLN
2.00 g | Freq: Once | INTRAVENOUS | Status: DC
Start: 2018-11-15 — End: 2018-11-15

## 2018-11-15 MED ORDER — ROCURONIUM BROMIDE 50 MG/5ML IV SOLN
INTRAVENOUS | Status: AC
Start: 2018-11-15 — End: ?
  Filled 2018-11-15: qty 5

## 2018-11-15 MED ORDER — SENNOSIDES-DOCUSATE SODIUM 8.6-50 MG PO TABS
2.0000 | ORAL_TABLET | Freq: Every evening | ORAL | Status: DC
Start: 2018-11-15 — End: 2018-11-16
  Administered 2018-11-15: 22:00:00 2 via ORAL
  Filled 2018-11-15: qty 2

## 2018-11-15 MED ORDER — ROCURONIUM BROMIDE 50 MG/5ML IV SOLN
INTRAVENOUS | Status: DC | PRN
Start: 2018-11-15 — End: 2018-11-15
  Administered 2018-11-15: 30 mg via INTRAVENOUS

## 2018-11-15 MED ORDER — CEFAZOLIN SODIUM 1 G IJ SOLR
2.00 g | Freq: Once | INTRAMUSCULAR | Status: AC
Start: 2018-11-15 — End: 2018-11-15
  Administered 2018-11-15: 16:00:00 2 g via INTRAVENOUS

## 2018-11-15 MED ORDER — GABAPENTIN 100 MG PO CAPS
100.00 mg | ORAL_CAPSULE | Freq: Three times a day (TID) | ORAL | Status: DC
Start: 2018-11-15 — End: 2018-11-16
  Administered 2018-11-15 – 2018-11-16 (×2): 100 mg via ORAL
  Filled 2018-11-15 (×2): qty 1

## 2018-11-15 MED ORDER — LIDOCAINE HCL (PF) 1 % IJ SOLN
INTRAMUSCULAR | Status: AC
Start: 2018-11-15 — End: ?
  Filled 2018-11-15: qty 30

## 2018-11-15 MED ORDER — ONDANSETRON HCL 4 MG/2ML IJ SOLN
4.0000 mg | Freq: Once | INTRAMUSCULAR | Status: DC | PRN
Start: 2018-11-15 — End: 2018-11-15

## 2018-11-15 MED ORDER — DEXAMETHASONE SODIUM PHOSPHATE 4 MG/ML IJ SOLN
INTRAMUSCULAR | Status: AC
Start: 2018-11-15 — End: ?
  Filled 2018-11-15: qty 1

## 2018-11-15 MED ORDER — BUPIVACAINE HCL (PF) 0.5 % IJ SOLN
INTRAMUSCULAR | Status: AC
Start: 2018-11-15 — End: ?
  Filled 2018-11-15: qty 30

## 2018-11-15 MED ORDER — LIDOCAINE 1% BUFFERED - CNR/OUTSOURCED
0.3000 mL | Freq: Once | INTRAMUSCULAR | Status: AC
Start: 2018-11-15 — End: 2018-11-15
  Administered 2018-11-15: 14:00:00 0.3 mL via INTRADERMAL

## 2018-11-15 MED ORDER — PROPOFOL INFUSION 10 MG/ML
INTRAVENOUS | Status: DC | PRN
Start: 2018-11-15 — End: 2018-11-15
  Administered 2018-11-15: 200 mg via INTRAVENOUS

## 2018-11-15 MED ORDER — MIDAZOLAM HCL 1 MG/ML IJ SOLN (WRAP)
INTRAMUSCULAR | Status: DC | PRN
Start: 2018-11-15 — End: 2018-11-15
  Administered 2018-11-15: 2 mg via INTRAVENOUS

## 2018-11-15 SURGICAL SUPPLY — 61 items
APPLCATOR CHLORAPREP 26ML (Prep) ×2 IMPLANT
BANDAGE CMPR CTTN PLSTR MED MTRX 5YDX4IN (Bandage) ×2
BANDAGE CMPR PLSTR CTTN MED MTRX 5YDX6IN (Bandage) ×1
BANDAGE COMPRESSION L5 YD X W4 IN ELASTIC HOOK LOOP CLOSURE STRETCH (Bandage) ×2 IMPLANT
BANDAGE ELASTIC L5 YD X W6 IN W/SELF-CLOSURE HOOK (Bandage) ×1 IMPLANT
BANDAGE MEDLINE COMPRESSION L5 YD X W4 (Bandage) ×2
BANDAGE MEDLINE MEDIUM COMPRESSION L5 YD (Bandage) ×1
BANDAGE WEBRIL 4" (Dressing) ×4 IMPLANT
BLADE S/SU RIBBACK CARB STL 15 (Blade) ×4 IMPLANT
DRAPE SRG TBRN 2.5IN CNVRT 131X86IN STRL (Drape) ×2
DRAPE SURGICAL CORD HOLD TAB CIRCULAR ELASTIC FENESTRATE REINFORCE (Drape) ×1 IMPLANT
DRESSING EMLSN OIL CURITY 3X5 (Dressing) ×4 IMPLANT
DRESSING EMLSN OIL CURITY 3X8 (Dressing) ×2 IMPLANT
FIBERWIRE #2 BRAID BLUE W/NDLE (Suture) ×6
GLOVE BIOGEL SURG SENSE SZ 7.0 (Glove) ×2 IMPLANT
GLOVE SURG BIOGEL INDIC SZ 7.5 (Glove) ×2 IMPLANT
GOWN SRG XL SMARTGOWN LF STRL LVL 4 (Gown) ×2
GOWN SURGICAL XL SMARTGOWN LEVEL 4 (Gown) ×2
GOWN SURGICAL XL SMARTGOWN LEVEL 4 BREATHABLE (Gown) ×2 IMPLANT
IMMOBILIZER ORTH PLSTR 240X4IN RL FT (Cast) ×2
IMMOBILIZER ORTHOPEDIC FOOT ROLL PLASTER 240X4IN (Cast) ×1 IMPLANT
NEEDLE 25GA 1 1/2 (Needles) ×2 IMPLANT
NEEDLE INJ SFTY 22GX1.5IN (Needles) IMPLANT
NEEDLE SUT SS CATGUT REG .5 CRC (Needles) ×1
NEEDLE SUTURE REGULAR 1/2 CIRCLE L1.521 (Needles) ×1
NEEDLE SUTURE REGULAR 1/2 CIRCLE L1.521 IN TAPER POINT FREE EYE HEAVY (Needles) ×1 IMPLANT
PACK PROC EXTREMITY SX LH (Pack) ×2 IMPLANT
PADDING CAST SYNTHETIC 4INX4YD (Cast) ×2 IMPLANT
PADDING CAST SYNTHETIC 6INX4YD (Cast) ×2 IMPLANT
PILLOW PATIENT 24X18IN MD WEIGHT FLAME RETARDANT CMFRTMD 15 OZ WHT (Patient Supply) ×2 IMPLANT
PILLOW PATIENT L24 IN X W18 IN MEDIUM (Patient Supply) ×2
PILLOW PT PLSTR COMFORTMED 24X18IN LF (Patient Supply) ×2
SPONGE GAUZE 12PLY STRL 4X4IN (Sponge) ×2 IMPLANT
SPONGE GAUZE L4 IN X W4 IN 16 PLY (Dressing) ×1
SPONGE GAUZE L4 IN X W4 IN 16 PLY (Sponge) ×1
SPONGE GAUZE L4 IN X W4 IN 16 PLY MAXIMUM ABSORBENT TRAY CURITY (Sponge) ×1 IMPLANT
SPONGE GAUZE L4 IN X W4 IN 16 PLY MAXIMUM ABSORBENT USP TYPE VII (Dressing) ×1 IMPLANT
SPONGE GZE CTTN CRTY 4X4IN LF NS 16 PLY (Dressing) ×1
SPONGE GZE PLS CTTN CRTY 4X4IN LF STRL (Sponge) ×1
SPONGE SRG VISTEC 4X4IN LF STRL 16 PLY (Sponge) ×1
SPONGE SURGICAL L4 IN X W4 IN 16 PLY (Sponge) ×1 IMPLANT
STOCKINETTE L48 IN X W12 IN IMPERVIOUS (Ortho Supply) ×1
STOCKINETTE L48 IN X W12 IN IMPERVIOUS PULL TAB HOLLOW ORTHOPEDIC (Ortho Supply) ×1 IMPLANT
STOCKINETTE ORTH 48X12IN LF STRL IMPRV (Ortho Supply) ×1
SUTURE ABS 3-0 PS2 STRATAFIX SPRL MNCRL (Suture) ×1
SUTURE ABS 4-0 PC5 VCL MTPS 18IN BRD (Suture) ×2
SUTURE BLUE 2 T-5 L38 IN BRAID TIES (Suture) ×6
SUTURE COATED VICRYL 4-0 PC-5 L18 IN (Suture) ×2
SUTURE COATED VICRYL 4-0 PC-5 L18 IN BRAID COATED UNDYED ABSORBABLE (Suture) ×2 IMPLANT
SUTURE ETHILON 4-0 PC5 18IN (Suture) ×4 IMPLANT
SUTURE FIBERWIRE BLUE 2 T-5 L38 IN BRAID TIES MULTISTRAND LOWER KNOT (Suture) ×6 IMPLANT
SUTURE NABSB 3-0 FS2 PRLN 18IN MFL BLU (Suture) ×2
SUTURE PROLENE BLUE 3-0 FS-2 L18 IN (Suture) ×2
SUTURE PROLENE BLUE 3-0 FS-2 L18 IN MONOFILAMENT NONABSORBABLE (Suture) ×2 IMPLANT
SUTURE STRATAFIX SPIRAL MONOCRYL PLUS (Suture) ×1
SUTURE STRATAFIX SPIRAL MONOCRYL PLUS 3-0 PS-2 L27 IN KNOTLESS TISSUE (Suture) ×1 IMPLANT
SUTURE VICRYL 3-0 CT2 27IN (Suture) ×4 IMPLANT
SYRINGE IRR TVK 60ML LF STRL LID SFT BLB (Syringes, Needles) ×1
SYRINGE LUER LOCK 10CC (Syringes, Needles) IMPLANT
SYRINGE MEDLINE 60 ML LID SOFT BULB TIP (Syringes, Needles) ×1
SYRINGE MEDLINE 60 ML LID SOFT BULB TIP IRRIGATION TYVEK (Syringes, Needles) ×1 IMPLANT

## 2018-11-15 NOTE — Transfer of Care (Signed)
Anesthesia Transfer of Care Note    Patient: Amanda [name]    Procedures performed: Procedure(s):  RIGHT ACHILLES TENDON REPAIR    Anesthesia type: General ETT    Patient location:Phase I PACU    Last vitals:   Vitals:    11/15/18 1728   BP: 153/68   Pulse: 85   Resp: 13   Temp: 36.5 C (97.7 F)   SpO2: 100%       Post pain: Patient not complaining of pain, continue current therapy      Mental Status:awake    Respiratory Function: tolerating face mask    Cardiovascular: stable    Nausea/Vomiting: patient not complaining of nausea or vomiting    Hydration Status: adequate    Post assessment: no apparent anesthetic complications    Signed by: Holley Bouche  11/15/18 5:29 PM

## 2018-11-15 NOTE — Anesthesia Preprocedure Evaluation (Deleted)
Anesthesia Evaluation    AIRWAY    Mallampati: II    TM distance: >3 FB  Neck ROM: full  Mouth Opening:full  Planned to use difficult airway equipment: No CARDIOVASCULAR    cardiovascular exam normal       DENTAL    no notable dental hx     PULMONARY    pulmonary exam normal     OTHER FINDINGS                  Relevant Problems   No relevant active problems               Anesthesia Plan    ASA 3     general                     intravenous induction   Detailed anesthesia plan: general endotracheal      Post Op: other plan for postoperative opioid use  Trial extubation is not planned.  Post op pain management: per surgeon    informed consent obtained    Plan discussed with CRNA.                 ===============================================================  Inpatient Anesthesia Evaluation    Patient Name: Amanda [name]  Surgeon: Conan Bowens, DPM  Patient Age / Sex: 62 y.o. / female    Medical History:     Past Medical History:   Diagnosis Date   . Cerebrovascular accident 10/2010;01/2012    ischemic;weakness right side;hand shakes   . Depression    . Hyperlipidemia    . Hypertension     well controlled   . Post-operative nausea and vomiting        Past Surgical History:   Procedure Laterality Date   . FOOT SURGERY Right 2018    Plantar fashitis   . SHOULDER SURGERY Right 2017    torn ligament repair, bone spur         Allergies:   No Known Allergies      Medications:     Current Facility-Administered Medications   Medication Dose Route Frequency Last Rate Last Dose   . lactated ringers infusion   Intravenous Continuous 20 mL/hr at 11/15/18 1347 1,000 mL at 11/15/18 1347              Prior to Admission medications    Medication Sig Start Date End Date Taking? Authorizing Provider   amLODIPine (NORVASC) 5 MG tablet Take 5 mg by mouth 10/31/17  Yes [provider]   aspirin (GOODSENSE ASPIRIN) 325 MG tablet Take 325 mg by mouth 01/26/12  Yes [provider]   FLUoxetine (PROZAC) 20 MG  capsule Take 20 mg by mouth 10/24/18  Yes [provider]   lisinopril-hydroCHLOROthiazide (PRINZIDE,ZESTORETIC) 20-12.5 MG per tablet Take 1 tablet by mouth 2 (two) times daily 08/28/18  Yes [provider]   simvastatin (ZOCOR) 20 MG tablet TK 1 T PO QPM 08/28/18  Yes [provider]   traZODone (DESYREL) 50 MG tablet TK 1 T PO Q NIGHT 10/25/18  Yes [provider]     Vitals   Temp:  [35.6 C (96.1 F)] 35.6 C (96.1 F)  Heart Rate:  [74] 74  Resp Rate:  [18] 18  BP: (107)/(57) 107/57    Wt Readings from Last 3 Encounters:   11/15/18 103.8 kg (228 lb 12.8 oz)     BMI (Estimated body mass index is 41.85 kg/m as calculated from the following:  Height as of this encounter: 1.575 m (5\' 2" ).    Weight as of this encounter: 103.8 kg (228 lb 12.8 oz).)  Temp Readings from Last 3 Encounters:   11/15/18 (!) 35.6 C (96.1 F) (Temporal)     BP Readings from Last 3 Encounters:   11/15/18 107/57     Pulse Readings from Last 3 Encounters:   11/15/18 74           Labs:   CBC:  No results found for: WBC, HGB, HCT, PLT    Chemistries:  No results found for: NA, K, CL, CO2, BUN, CREAT, GLU, HGBA1C, CA, AST    Coags:  No results found for: PT, PTT, INR  _____________________      Signed by: Wendie Simmer  11/15/18   2:05 PM    =============================================================      Signed by: Wendie Simmer 11/15/18 2:05 PM

## 2018-11-15 NOTE — PACU (Signed)
Left message for patient's daughter on her room assignment along with the main phone number for the [facility].

## 2018-11-15 NOTE — Progress Notes (Signed)
Pt arrived to [facility] with belongings at the bedside. Vitals stable.

## 2018-11-15 NOTE — H&P (Signed)
Podiatric Surgery Interval H&P    Date Time: 11/15/18 2:38 PM  Patient Name: Amanda [name], Amanda [name]    Patient S&E by me, current H&P on file verified within 30 days and no changes to H&P noted.    General: AAOx3, NAD, well appearance  HEENT: NC/AT  Heart: regular rate  Lungs: no wheezes, symmetric air entry, normal effort  Abd: soft, nontender  LE: Palpable pedal pulses to the right foot with brisk CFT and intact motor and sensory function to the digits.     [name] is a 62 y.o. female with right achilles tendon rupture presents for surgical correction.    - Patient stable, proceed with planned surgery:  Procedure(s) (LRB):  REPAIR PRIMARY ACHILLES TENDON WITH GRAFT RIGHT (Right)  - Prophylactic antibiotics: 2 grams of Ancef  preoperatively  - All alternatives, risks, and complications discussed with patient and all question/concerns addressed. Patient exhibited appropriate understanding of all discussion points. Informed consent obtained with no guarantees to surgical outcome given or implied.    Urbano Heir, DPM PGY2  Pager [phone]

## 2018-11-15 NOTE — Brief Op Note (Signed)
BRIEF OP NOTE    Date Time: 11/15/18 5:23 PM    Patient Name:   Amanda [name]    Date of Operation:   11/15/2018    Providers Performing:   Surgeon(s) and Role:     * Conan Bowens, DPM - Primary     * Narda Bonds, DPM - Resident - Assisting    Preoperative Diagnosis:   Spontaneous rupture of flexor tendon of right foot [M66.371]    Postoperative Diagnosis:   same    Operative Procedure:   Procedure(s):  RIGHT ACHILLES TENDON REPAIR    Anesthesia:   General  Anesthesiologist: Wendie Simmer, MD  CRNA: Holley Bouche, CRNA    Antibiotics:   2 grams of Ancef  preoperatively    Hemostasis:   Anatomic dissection, mechanical compression, electrocautery  No tourniquet was used throughout the entire procedure  * Missing tourniquet times found for documented tourniquets in log: [id number] *  Estimated Blood Loss:   * No blood loss amount entered * 15 cc    Materials:   Vicryl, Prolene and Fiberwire  Drains: None    Implants:   * No implants in log *    Injectables:   Preoperatively: popliteal block with anesthesiologist  Postoperatively: none    Specimens:     ID Type Source Tests Collected by Time Destination   A : no specimen collected No Specimen Required No Specimen NO PATHOLOGY Conan Bowens, [facility] 11/15/2018 1631        Findings:    See dictation for full details.    Complications:   None.    Urbano Heir, DPM PGY2

## 2018-11-15 NOTE — PACU (Signed)
Report given to Katie, RN.

## 2018-11-15 NOTE — Anesthesia Postprocedure Evaluation (Addendum)
Anesthesia Post Evaluation    Patient: Amanda [name]    Procedure(s):  RIGHT ACHILLES TENDON REPAIR    Anesthesia type: general    Last Vitals:   Vitals Value Taken Time   BP 153/68 11/15/2018  5:28 PM   Temp 36.5 C (97.7 F) 11/15/2018  5:28 PM   Pulse 85 11/15/2018  5:28 PM   Resp 13 11/15/2018  5:28 PM   SpO2 100 % 11/15/2018  5:28 PM       Anesthesia Post Evaluation:     Patient Evaluated: PACU  Patient Participation: complete - patient participated  Level of Consciousness: awake  Pain Score: 0  Pain Management: adequate  Multimodal analgesia pain management approach    Airway Patency: patent    Anesthetic complications: No      PONV Status: none    Cardiovascular status: acceptable  Respiratory status: acceptable  Hydration status: acceptable        Anesthesia Qualified Clinical Data Registry 2018    PACU Reintubation  Did the Patient have general anesthesia with intubation: Yes  Did the Patient require reintubation in the PACU?: No  Was this a planned exubation trial (documented in the medical record)?: No    PONV Adult  Is the patient aged 8 or older: Yes  Did the patient receive recieve a general anesthestic: Yes  Does the patient have 3 or more risk factors for PONV? No  Did the patient receive anti-emetics from at least two classes of medications? Yes      PONV Pediatric  Is the patient aged 43-17? No            PACU Transfer Checklist Protocol  Was the patient transferred to the PACU at the conclusion of surgery? Yes  Was a checklist or transfer protocol used? Yes    ICU Transfer Checklist Protocol  Was the patient transferred to the ICU at the conclusion of surgery? No      Post-op Pain Assessment Prior to Anesthesia Care End  Age >=18 and assessed for pain in PACU: Yes  Pacu pain score <7/10: Yes      Perioperative Mortality  Perioperative mortality prior to Anesthesia end time: No    Perioperative Cardiac Arrest  Did the patient have an unanticipated intraoperative cardiac arrest between  anesthesia start time and anesthesia end time? No    Unplanned Admission to ICU  Did the patient have an unplanned admission to the ICU (not initially anticipated at anesthesia start time)? No      Signed by: Holley Bouche, 11/15/2018 5:29 PM

## 2018-11-15 NOTE — Anesthesia Procedure Notes (Addendum)
Peripheral    Patient location during procedure: Pre-Op  Reason for block: Post-op pain managment  Injection technique: Single-shot  Block Region: Popliteal sciatic  Laterality: Right  Block at surgeon's request Yes  Start time: 11/15/2018 3:00 PM  End time: 11/15/2018 3:15 PM    Staffing  Anesthesiologist: Wendie Simmer, MD  Performed: Anesthesiologist     Pre-procedure Checklist   Completed: patient identified, surgical consent, pre-op evaluation, timeout performed, risks and benefits discussed, anesthesia consent given and correct site  Timeout Completed:  11/15/2018 3:00 PM    Peripheral Block  Patient position: Supine  Premedication: Meaningful contact maintained  Local infiltration: Lidocaine 1%    Needle  Needle type: Other   Needle gauge: 21 G  Needle length: 4 in    Procedures: ultrasound guided  Ultrasound Guided: LA spread visualized, Needle visualized, Relevant anatomy identified (nerve, vessels, muscle) and Image stored or printed      Assessment   Incremental injection: yes  Injection made incrementally with aspirations every 5 mL.  Injection Resistance: no  Paresthesia Pain: No    Blood Aspirated: No  no suspected intravascular injection  Patient tolerated procedure well: Yes  Block Outcome: No complications and Successful block  Additional Notes  30cc of 0.5%ropivicaine injected incrementally with a sonoplex needle

## 2018-11-15 NOTE — Discharge Instr - AVS First Page (Addendum)
Foot & Ankle Surgery Patient Discharge Instructions:     Arrange to have an adult drive you home after surgery. If you had general anesthesia, it may take a day or more to fully recover. So, for at least the next 24 hours: Do not drive or use machinery or power tools; do not drink alcohol; and do not make any major decisions.     What to Expect   It is normal to have the following:   Bruising and slight swelling of the foot and toes   A small amount of blood on the dressing    Bandages:   Keep your dressings clean, dry, and intact. Do not remove or change.   When bathing/showering, cover the bandage or cast with a plastic bag to keep it dry.   Don't remove your bandage until your doctor tells you to. If your bandage gets wet or dirty, check with your doctor. You can likely replace it with a clean, dry one.    Activity:   non weightbearing to operative lower extremity with posterior splint, crutches   Sit or lay down when possible. Put a pillow under your heel to raise your foot above the level of your heart.   Wrap an ice pack or bag of frozen peas in a thin cloth. Place it over your bandaged foot for no longer than 20 minutes. Do this three times a day.   You can drive again when instrcuted by your doctor.   Wear your surgical shoe at all times unless told otherwise by your health care provider.   Use crutches or a cane as directed.   Follow your doctor's instructions about putting weight on your foot.    Diet:   Start with liquids and light foods (such as dry toast, bananas, and apple sauce). As you feel up to it, slowly return to your normal diet.   Drink at least six to eight glasses of water or other nonalcoholic fluids a day.   To avoid nausea, eat before taking narcotic pain medications.    Medications:   Take all medications as instructed.   Take pain medications on time. Do not wait until the pain is bad before taking your medications.   Avoid alcohol while on pain medications.     Call your doctor if:   Continuous bleeding through the bandage   Excessive swelling, increased bleeding, or redness   Fever over 100.52F or chills   Pain unrelieved by pain medications   Foot feels cold to the touch or numb   Chest pain or shortness of breath      Post Anesthesia Discharge Instructions    Although you may be awake and alert in the recovery room, small amounts of anesthetic remain in your system for about 24 hours.  You may feel tired and sleepy during this time.      You are advised to go directly home from the hospital.    Plan to stay at home and rest for the remainder of the day.    It is advisable to have someone with you at home for 24 hours after surgery.    Do not operate a motor vehicle, or any mechanical or electrical equipment for the next 24 hours.      Be careful when you are walking around, you may become dizzy.  The effects of anesthesia and/or medications are still present and drowsiness may occur    Do not consume alcohol, tranquilizers, sleeping medications, or any  other non prescribed medication for the remainder of the day.    Diet:  begin with liquids, progress your diet as tolerated or as directed by your surgeon.  Nausea and vomiting may occur in the next 24 hours.

## 2018-11-16 ENCOUNTER — Ambulatory Visit: Payer: No Typology Code available for payment source

## 2018-11-16 ENCOUNTER — Encounter: Payer: Self-pay | Admitting: Foot & Ankle Surgery

## 2018-11-16 DIAGNOSIS — M66371 Spontaneous rupture of flexor tendons, right ankle and foot: Secondary | ICD-10-CM | POA: Insufficient documentation

## 2018-11-16 DIAGNOSIS — I10 Essential (primary) hypertension: Secondary | ICD-10-CM | POA: Insufficient documentation

## 2018-11-16 DIAGNOSIS — R0602 Shortness of breath: Secondary | ICD-10-CM | POA: Diagnosis not present

## 2018-11-16 DIAGNOSIS — J189 Pneumonia, unspecified organism: Secondary | ICD-10-CM | POA: Diagnosis present

## 2018-11-16 DIAGNOSIS — Z8673 Personal history of transient ischemic attack (TIA), and cerebral infarction without residual deficits: Secondary | ICD-10-CM

## 2018-11-16 DIAGNOSIS — E785 Hyperlipidemia, unspecified: Secondary | ICD-10-CM

## 2018-11-16 DIAGNOSIS — G4700 Insomnia, unspecified: Secondary | ICD-10-CM

## 2018-11-16 DIAGNOSIS — M25571 Pain in right ankle and joints of right foot: Secondary | ICD-10-CM

## 2018-11-16 DIAGNOSIS — Z9889 Other specified postprocedural states: Secondary | ICD-10-CM

## 2018-11-16 DIAGNOSIS — F329 Major depressive disorder, single episode, unspecified: Secondary | ICD-10-CM

## 2018-11-16 LAB — CBC AND DIFFERENTIAL
Absolute NRBC: 0 10*3/uL (ref 0.00–0.00)
Basophils Absolute Automated: 0.03 10*3/uL (ref 0.00–0.08)
Basophils Automated: 0.2 %
Eosinophils Absolute Automated: 0.01 10*3/uL (ref 0.00–0.44)
Eosinophils Automated: 0.1 %
Hematocrit: 33.4 % — ABNORMAL LOW (ref 34.7–43.7)
Hgb: 11.9 g/dL (ref 11.4–14.8)
Immature Granulocytes Absolute: 0.08 10*3/uL — ABNORMAL HIGH (ref 0.00–0.07)
Immature Granulocytes: 0.6 %
Lymphocytes Absolute Automated: 0.87 10*3/uL (ref 0.42–3.22)
Lymphocytes Automated: 6.9 %
MCH: 32.9 pg (ref 25.1–33.5)
MCHC: 35.6 g/dL (ref 31.5–35.8)
MCV: 92.3 fL (ref 78.0–96.0)
MPV: 11.6 fL (ref 8.9–12.5)
Monocytes Absolute Automated: 0.83 10*3/uL (ref 0.21–0.85)
Monocytes: 6.6 %
Neutrophils Absolute: 10.82 10*3/uL — ABNORMAL HIGH (ref 1.10–6.33)
Neutrophils: 85.6 %
Nucleated RBC: 0 /100 WBC (ref 0.0–0.0)
Platelets: 166 10*3/uL (ref 142–346)
RBC: 3.62 10*6/uL — ABNORMAL LOW (ref 3.90–5.10)
RDW: 12 % (ref 11–15)
WBC: 12.64 10*3/uL — ABNORMAL HIGH (ref 3.10–9.50)

## 2018-11-16 LAB — COMPREHENSIVE METABOLIC PANEL
ALT: 18 U/L (ref 0–55)
AST (SGOT): 16 U/L (ref 5–34)
Albumin/Globulin Ratio: 1.3 (ref 0.9–2.2)
Albumin: 3.7 g/dL (ref 3.5–5.0)
Alkaline Phosphatase: 83 U/L (ref 37–106)
Anion Gap: 9 (ref 5.0–15.0)
BUN: 18.7 mg/dL (ref 7.0–19.0)
Bilirubin, Total: 0.8 mg/dL (ref 0.2–1.2)
CO2: 26 mEq/L (ref 22–29)
Calcium: 9.1 mg/dL (ref 8.5–10.5)
Chloride: 104 mEq/L (ref 100–111)
Creatinine: 0.8 mg/dL (ref 0.6–1.0)
Globulin: 2.8 g/dL (ref 2.0–3.6)
Glucose: 115 mg/dL — ABNORMAL HIGH (ref 70–100)
Potassium: 4.1 mEq/L (ref 3.5–5.1)
Protein, Total: 6.5 g/dL (ref 6.0–8.3)
Sodium: 139 mEq/L (ref 136–145)

## 2018-11-16 LAB — GFR: EGFR: >60

## 2018-11-16 LAB — B-TYPE NATRIURETIC PEPTIDE: B-Natriuretic Peptide: 77.6 pg/mL (ref 0.0–100.0)

## 2018-11-16 MED ORDER — AMLODIPINE BESYLATE 5 MG PO TABS
5.0000 mg | ORAL_TABLET | Freq: Every day | ORAL | Status: DC
Start: 2018-11-16 — End: 2018-11-16
  Administered 2018-11-16: 09:00:00 5 mg via ORAL
  Filled 2018-11-16 (×2): qty 1

## 2018-11-16 MED ORDER — MIDAZOLAM HCL 1 MG/ML IJ SOLN (WRAP)
INTRAMUSCULAR | Status: AC
Start: 2018-11-16 — End: ?
  Filled 2018-11-16: qty 2

## 2018-11-16 MED ORDER — LISINOPRIL 20 MG PO TABS
1.0000 | ORAL_TABLET | Freq: Two times a day (BID) | ORAL | Status: DC
Start: 2018-11-16 — End: 2018-11-16
  Administered 2018-11-16: 1 via ORAL
  Filled 2018-11-16: qty 1

## 2018-11-16 MED ORDER — DOCUSATE SODIUM 100 MG PO CAPS
100.00 mg | ORAL_CAPSULE | Freq: Two times a day (BID) | ORAL | Status: DC
Start: 2018-11-16 — End: 2018-11-16
  Administered 2018-11-16: 09:00:00 100 mg via ORAL
  Filled 2018-11-16: qty 1

## 2018-11-16 MED ORDER — TRAZODONE HCL 50 MG PO TABS
50.0000 mg | ORAL_TABLET | Freq: Every evening | ORAL | Status: DC
Start: 2018-11-16 — End: 2018-11-16

## 2018-11-16 MED ORDER — SIMVASTATIN 10 MG PO TABS
20.0000 mg | ORAL_TABLET | Freq: Every evening | ORAL | Status: DC
Start: 2018-11-16 — End: 2018-11-16

## 2018-11-16 MED ORDER — FLUOXETINE HCL 20 MG PO CAPS
20.00 mg | ORAL_CAPSULE | Freq: Every day | ORAL | Status: DC
Start: 2018-11-16 — End: 2018-11-16
  Administered 2018-11-16: 09:00:00 20 mg via ORAL
  Filled 2018-11-16: qty 1

## 2018-11-16 MED ORDER — AZITHROMYCIN 500 MG PO TABS
500.00 mg | ORAL_TABLET | Freq: Every day | ORAL | 0 refills | Status: AC
Start: 2018-11-16 — End: 2018-11-21

## 2018-11-16 MED ORDER — AZITHROMYCIN 250 MG PO TABS
500.0000 mg | ORAL_TABLET | Freq: Every day | ORAL | Status: DC
Start: 2018-11-16 — End: 2018-11-16
  Administered 2018-11-16: 13:00:00 500 mg via ORAL
  Filled 2018-11-16: qty 2

## 2018-11-16 NOTE — Consults (Addendum)
[facility]  Consult Note  Patient Info:   Date/Time: 11/16/2018 / 3:40 AM   Admit Date:11/15/2018  Patient Name:Amanda [name]   VQQ:[id number]   PCP: Lowanda Foster, MD  Attending Physician:Gorenshtein, Lyn Hollingshead, *     Assessment and Plan:   Reason for Consult: Medical management     Thank you Conan Bowens, * for consulting Hospitalist service.     POD#0 S/P Right Achilles tendon repair: Pt underwent popliteal block. Will continue pain control with Percocet, dilaudid and Xylocaine patch.  Will start stool softener.  Continue incentive spirometer and continuous pulse oximeter checks.   Leukocytosis: This is a reactive process.  No signs of infection. CBC in the morning   Depression NOS: Prozac 20 mg    Hyperlipidemia: Zocor 20 mg   Chronic Insomnia: Continue trazodone 50 mg nightly    Essential hypertension: Lisinopril- HCTZ 20-12.5    History of CVA: No complaints. No focal neurological deficits.     DVT Prohylaxis:lovenox   Code Status: Full Code      Hospital Problems:   Active Problems:    Nausea    Clinical Presentation:   History of Presenting Illness:   Amanda [name] is a 62 y.o. female who has history of   Past Surgical History:   Procedure Laterality Date   . FOOT SURGERY Right 2018    Plantar fashitis   . SHOULDER SURGERY Right 2017    torn ligament repair, bone spur      Past Medical History:   Diagnosis Date   . Cerebrovascular accident 10/2010;01/2012    ischemic;weakness right side;hand shakes   . Depression    . Hyperlipidemia    . Hypertension     well controlled   . Post-operative nausea and vomiting     came with the chief complaint of No chief complaint on file.    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  At bedside patient  could not ascertain why she gets shortness of breath and desaturation with pain medication.  She said she had no complaints.  No chest pain, shortness of breath, palpitations, weakness, numbness or paresthesias at the time patient was examined.  Review of system as documented below.     Review of Systems:   Review of Systems   Constitutional: Negative.    HENT: Negative.    Eyes: Negative.    Respiratory: Negative.    Cardiovascular: Negative.    Gastrointestinal: Negative.    Genitourinary: Negative.    Musculoskeletal: Negative.    Skin: Negative.    Neurological: Positive for weakness.        Sequelae of stroke   Endo/Heme/Allergies: Negative.    Psychiatric/Behavioral: Negative.      Physical Exam:     Vitals:    11/15/18 2040 11/15/18 2142 11/15/18 2342 11/16/18 0039   BP: 123/58 127/69 127/69 110/67   Pulse: 74 82 82 75   Resp: 15 16 16 16    Temp:  98.8 F (37.1 C) 98.8 F (37.1 C) 97.5 F (36.4 C)   TempSrc:       SpO2: 97% 95%  98%   Weight:   103.8 kg (228 lb 12.8 oz)    Height:   1.575 m (5' 2.01")      Physical Exam   Constitutional: She is  oriented to person, place, and time and well-developed, well-nourished, and in no distress.   HENT:   Head: Normocephalic and atraumatic.   Eyes: Pupils are equal, round, and reactive to light. Conjunctivae are normal. Left eye exhibits no discharge. No scleral icterus.   Neck: Normal range of motion. Neck supple.   Cardiovascular: Normal rate, regular rhythm, normal heart sounds and intact distal pulses. Exam reveals no gallop and no friction rub.   No murmur heard.  Pulmonary/Chest: Effort normal and breath sounds normal. No respiratory distress. She has no wheezes. She has no rales. She exhibits no tenderness.   Abdominal: Soft. Bowel sounds are normal. She exhibits no distension and no mass. There is no tenderness. There is no rebound and no guarding.   Musculoskeletal:      Comments: RLE: Wrapped. Normal color compared to left LE. Sensation intact.     Neurological: She is alert and oriented to person, place, and time. No cranial nerve deficit. Coordination normal.   Skin: Skin is warm and dry.   Psychiatric: Mood, memory, affect and judgment normal.       Clinical Information and History:   Chief Complaint:No chief complaint on file.    Past Medical History:  Past Medical History:   Diagnosis Date   . Cerebrovascular accident 10/2010;01/2012    ischemic;weakness right side;hand shakes   . Depression    . Hyperlipidemia    . Hypertension     well controlled   . Post-operative nausea and vomiting      Past Surgical History:  Past Surgical History:   Procedure Laterality Date   . FOOT SURGERY Right 2018    Plantar fashitis   . SHOULDER SURGERY Right 2017    torn ligament repair, bone spur     Family History:History reviewed. No pertinent family history.  Social History:  Social History     Substance and Sexual Activity   Alcohol Use Not on file     Social History     Substance and Sexual Activity   Drug Use Not Currently     Social History     Tobacco Use   Smoking Status Never Smoker   Smokeless Tobacco Never Used     Social History     Socioeconomic History   . Marital status: Married     Spouse name: None   . Number of children: None   . Years of education: None   . Highest education level: None   Occupational History   . None   Social Needs   . Financial resource strain: None   . Food insecurity:     Worry: None     Inability: None   . Transportation needs:     Medical: None     Non-medical: None   Tobacco Use   . Smoking status: Never Smoker   . Smokeless tobacco: Never Used   Substance and Sexual Activity   . Alcohol use: None   . Drug use: Not Currently   . Sexual activity: None   Lifestyle   . Physical activity:     Days per week: None     Minutes per session: None   . Stress: None   Relationships   . Social connections:     Talks on phone: None     Gets together: None     Attends religious service: None     Active member of club or organization: None      Attends meetings of clubs  or organizations: None     Relationship status: None   . Intimate partner violence:     Fear of current or ex partner: None     Emotionally abused: None     Physically abused: None     Forced sexual activity: None   Other Topics Concern   . None   Social History Narrative   . None     Allergies:No Known Allergies  Medications:  Medications Prior to Admission   Medication Sig Dispense Refill Last Dose   . amLODIPine (NORVASC) 5 MG tablet Take 5 mg by mouth   11/15/2018 at 0600   . aspirin (GOODSENSE ASPIRIN) 325 MG tablet Take 325 mg by mouth   Past Month at Unknown time   . FLUoxetine (PROZAC) 20 MG capsule Take 20 mg by mouth   11/14/2018 at 2100   . lisinopril-hydroCHLOROthiazide (PRINZIDE,ZESTORETIC) 20-12.5 MG per tablet Take 1 tablet by mouth 2 (two) times daily  4 11/15/2018 at 0600   . simvastatin (ZOCOR) 20 MG tablet TK 1 T PO QPM  4 11/14/2018 at 2100   . traZODone (DESYREL) 50 MG tablet TK 1 T PO Q NIGHT  2 Taking at 2100     Results of Labs/imaging   Labs have been reviewed:   Coagulation Profile:       CBC review:   Recent Labs   Lab 11/15/18  2118   WBC 12.46*   Hgb 12.6   Hematocrit 35.7   Platelets 149   MCV 93.2   RDW 12     Chem Review:  Recent Labs   Lab 11/15/18  2118   Sodium 138   Potassium 4.2   Chloride 103   CO2 25   BUN 24.1*   Creatinine 0.9   Glucose 152*   Calcium 9.1     Results     Procedure Component Value Units Date/Time    Basic Metabolic Panel [[id number]]  (Abnormal) Collected:  11/15/18 2118    Specimen:  Blood Updated:  11/15/18 2144     Glucose 152 mg/dL      BUN 40.9 mg/dL      Creatinine 0.9 mg/dL      Calcium 9.1 mg/dL      Sodium 811 mEq/L      Potassium 4.2 mEq/L      Chloride 103 mEq/L      CO2 25 mEq/L      Anion Gap 10.0    GFR [[id number]] Collected:  11/15/18 2118     Updated:  11/15/18 2144     EGFR >60.0    Lactic acid, plasma [[id number]] Collected:  11/15/18 2118    Specimen:  Blood Updated:  11/15/18 2139     Lactic acid 1.1 mmol/L     CBC  [[id number]]  (Abnormal) Collected:  11/15/18 2118    Specimen:  Blood Updated:  11/15/18 2128     WBC 12.46 x10 3/uL      Hgb 12.6 g/dL      Hematocrit 41.3 %      Platelets 149 x10 3/uL      RBC 3.83 x10 6/uL      MCV 93.2 fL      MCH 32.9 pg      MCHC 35.3 g/dL      RDW 12 %      MPV 11.4 fL      Nucleated RBC 0.0 /100 WBC      Absolute NRBC 0.00 x10 3/uL  Glucose Whole Blood - POCT [[id number]]  (Abnormal) Collected:  11/15/18 1338     Updated:  11/15/18 1343     POCT - Glucose Whole blood 107 mg/dL         Radiology reports have been reviewed:  Radiology Results (24 Hour)     Procedure Component Value Units Date/Time    XR Chest AP Portable [[id number]] Collected:  11/15/18 2114    Order Status:  Completed Updated:  11/15/18 2119    Narrative:       CLINICAL HISTORY: Shortness of breath.    FINDINGS: AP view of the chest. No prior studies are available for  comparison.    Cardiac silhouette is prominent but magnified by AP technique. Pulmonary  vascularity is normal.     Lungs are hypoinflated. Patchy bibasilar airspace disease, more  pronounced on the left than right could represent pneumonia or  atelectasis.      Impression:        Prominent heart and bilateral airspace disease.    Darra Lis, MD   11/15/2018 9:15 PM    US GUIDED NERVE BLOCK FOR ANESTHESIA [[id number]] Resulted:  11/15/18 1423    Order Status:  Completed Updated:  11/15/18 1423    Narrative:       An ultrasound-guided nerve block was performed. For details regarding this   procedure please refer to the anesthesia record.        EKG: EKG reviewed   Hospitalist   Signed by:   Kavin Leech  11/16/2018 3:40 AM    *This note was generated by the Epic EMR system/ Dragon speech recognition and may contain inherent errors or omissions not intended by the user. Grammatical errors, random word insertions, deletions, pronoun errors and incomplete sentences are occasional consequences of this technology due to software limitations. Not all errors  are caught or corrected. If there are questions or concerns about the content of this note or information contained within the body of this dictation they should be addressed directly with the author for clarification

## 2018-11-16 NOTE — Op Note (Signed)
PROCEDURE DATE: 11/15/18    PATIENT: Amanda [name]    SURGEON: Conan Bowens, DPM  RESIDENT: Urbano Heir, DPM    PREOPERATIVE DIAGNOSIS:  Spontaneous rupture of flexor tendon of right foot [M66.371]    POSTOPERATIVE DIAGNOSIS:  same    TITLES OF PROCEDURES:  1. Achilles tendon debridement and repair, right    ANESTHESIA:  General     ANTIBIOTICS:  2 grams of Ancef  preoperatively    HEMOSTASIS:  Anatomical dissection, mechanical compression, electrocautery  No tourniquet was used    ESTIMATED BLOOD LOSS:  15 mL    MATERIALS:  Vicryl, Prolene and Fiberwire    IMPLANTS:  * No implants in log *    INJECTABLES:  Preoperatively: popliteal block with anesthesiologist    DRAINS:  None    SPECIMENS:  ID Type Source Tests Collected by Time Destination   A : no specimen collected No Specimen Required No Specimen NO PATHOLOGY Conan Bowens, [facility] 11/15/2018 1631        COMPLICATIONS:  None.    INDICATIONS FOR PROCEDURE:  Amanda [name] presents to Conan Bowens, DPM with a chief complaint of right achilles weakness, degeneration. The patient has failed conservative treatments of various modalities. At this time the patient has elected to proceed with surgical correction. All alternatives, risks, and complications of the procedures were thoroughly explained to the patient. Patient exhibits appropriate understanding of all discussion points and informed consent was signed and obtained in the chart with no guarantees to surgical outcome given or implied.    DESCRIPTION OF PROCEDURE:  Patient was brought to the operating room and placed on the operative table in the supine position. Patient was secured to the table with safety belt, a contralateral SCD was placed, and all bony prominences were well padded. A surgical timeout was performed and all members of the operating room, the procedure, and the surgical site were identified. General anesthesia occurred. Local anesthetic as previously  described was then injected about the operative field in a local infiltrative block. The right lower extremity was then prepped and draped in the usual sterile manner.  The following procedure then began.    Attention was directed to the right heel. With a #15 blade, an incision was made over the achilles tendon approximately 8 cm in length.  The incision was deepened through subcutaneous tissues using sharp and blunt dissection. All bleeders were cut, clamped, and cauterized as necessary. Care was taken to protect all neurovascular structures throughout. At this time, the tendo-achilles was well visualized. Using fiberwire, a primary repair of the right Achilles tendon was performed. A 3 cm transverse incision was made at the level of maximal scar tissue and degeneration of the Achilles tendon as palpated, approximately 3-4 cm proximal to calcaneal insertion. The achilles edges were freshened and removed and chronic degenerated tissue. The freshened ends were then sutured together utilizing fiberwire via Krackow suturing, and the ends were well approximated. Following irrigation with copious amounts of sterile saline, the tissue layers were closed in anatomic layers using the sutures described above.      The surgical site was dressed with adaptic, 4x4 gauze, kerlix, cast padding and splint was applied with right ankle in plantarflexion. The patient tolerated both the procedure and anesthesia well with vital signs stable throughout. The patient was transferred from the OR to recovery in good condition.    POSTOPERATIVE PLAN:  The patient will follow the protocol of rest, ice, and elevation. The dressing is to  remain clean, dry, and intact.  The patient will be non-weightbearing to the operative limb until further instructions by the surgeon. Conan Bowens, DPM will follow the patient throughout the entire post-operative course and the patient is aware of all post-operative protocols in place. The patient  will be admitted postoperively for pain control, physical therapy, and surgical site monitoring.    Signed by: Urbano Heir, DPM [phone])

## 2018-11-16 NOTE — UM Notes (Addendum)
[facility] Utilization Review   NPI [id number], Tax ID [phone]  Please call Deedra Ehrich @ [phone] with any questions or concerns.  Email:  [email or web address]  Fax final authorization and requests for additional information to [phone]    IP admission 12/4    Amanda [name], Amanda [name]   Adm Date/Time: 11/15/18 1925 Admit Dx: 727.68 (ICD-9-CM) - 4587230515 (ICD-10-CM) - Spontaneous rupture of flexor tendon of right foot, Spontaneous rupture of flexor tendon of right foot [M66.371]    IP Adm Date/Time: 11/15/18 1925 Admit Px: PR RPR PRIMARY OPEN/PRQ RUPTURED ACHILLES W/GRAFT [[id number]], REPAIR, ACHILLES TENDON      Date of Operation:   11/15/2018    Providers Performing:   Surgeon(s) and Role:     * Conan Bowens, DPM - Primary     * Blanchet, Joretta Bachelor, DPM - Resident - Assisting    Preoperative Diagnosis:   Spontaneous rupture of flexor tendon of right foot [M66.371]    Postoperative Diagnosis:   same    Operative Procedure:   Procedure(s):  RIGHT ACHILLES TENDON REPAIR    Anesthesia:   General  Anesthesiologist: Wendie Simmer, MD  CRNA: Holley Bouche, CRNA    Antibiotics:   2 grams of Ancef  preoperatively    Hemostasis:   Anatomic dissection, mechanical compression, electrocautery  No tourniquet was used throughout the entire procedure  * Missing tourniquet times found for documented tourniquets in log: [id number] *    Materials:   Vicryl, Prolene and Fiberwire  Drains: None    Injectables:   Preoperatively: popliteal block with anesthesiologist  Postoperatively: none    Specimens:     ID Type Source Tests Collected by Time Destination   A : no specimen collected No Specimen Required No Specimen NO PATHOLOGY Conan Bowens, [facility] 11/15/2018 1631        Findings:    See dictation for full details.    Complications:   None.    PLAN:  Resume po meds  Lovenox 40 mg SQ  Dilaudid IV prn  Zofran IV prn  Phenergan IV prn  Cardiac diet  BMP/CBC  Continuous Pulse   Head of  bed 30 %  IS  I&O  SCD's     POD#0 S/P Right Achilles tendon repair: Pt underwent popliteal block. Will continue pain control with Percocet, dilaudid and Xylocaine patch.  Will start stool softener.  Continue incentive spirometer and continuous pulse oximeter checks.   Leukocytosis: This is a reactive process.  No signs of infection. CBC in the morning   Depression NOS: Prozac 20 mg    Hyperlipidemia: Zocor 20 mg   Chronic Insomnia: Continue trazodone 50 mg nightly    Essential hypertension: Lisinopril- HCTZ 20-12.5    History of CVA: No complaints. No focal neurological deficits.

## 2018-11-16 NOTE — Plan of Care (Signed)
Problem: Safety  Goal: Patient will be free from injury during hospitalization  Outcome: Progressing  Flowsheets (Taken 11/16/2018 0043)  Patient will be free from injury during hospitalization : Provide and maintain safe environment; Assess patient's risk for falls and implement fall prevention plan of care per policy; Use appropriate transfer methods; Ensure appropriate safety devices are available at the bedside; Include patient/ family/ care giver in decisions related to safety; Hourly rounding; Assess for patients risk for elopement and implement Elopement Risk Plan per policy; Provide alternative method of communication if needed (communication boards, writing)     Problem: Pain  Goal: Pain at adequate level as identified by patient  Outcome: Progressing  Flowsheets (Taken 11/16/2018 0043)  Pain at adequate level as identified by patient: Identify patient comfort function goal; Assess for risk of opioid induced respiratory depression, including snoring/sleep apnea. Alert healthcare team of risk factors identified.; Assess pain on admission, during daily assessment and/or before any "as needed" intervention(s); Reassess pain within 30-60 minutes of any procedure/intervention, per Pain Assessment, Intervention, Reassessment (AIR) Cycle; Evaluate if patient comfort function goal is met; Evaluate patient's satisfaction with pain management progress; Offer non-pharmacological pain management interventions; Consult/collaborate with Physical Therapy, Occupational Therapy, and/or Speech Therapy; Include patient/patient care companion in decisions related to pain management as needed     Problem: Compromised Hemodynamic Status  Goal: Vital signs and fluid balance maintained/improved  Outcome: Progressing  Flowsheets (Taken 11/16/2018 0043)  Vital signs and fluid balance are maintained/improved: Position patient for maximum circulation/cardiac output; Monitor/assess vitals and hemodynamic parameters with position  changes; Monitor and compare daily weight; Monitor intake and output. Notify LIP if urine output is less than 30 mL/hour.; Monitor/assess lab values and report abnormal values     Problem: Compromised Tissue integrity  Goal: Damaged tissue is healing and protected  Outcome: Progressing  Flowsheets (Taken 11/16/2018 0043)  Damaged tissue is healing and protected : Monitor/assess Braden scale every shift; Provide wound care per wound care algorithm; Increase activity as tolerated/progressive mobility; Reposition patient every 2 hours and as needed unless able to reposition self; Relieve pressure to bony prominences for patients at moderate and high risk; Avoid shearing injuries; Keep intact skin clean and dry; Use bath wipes, not soap and water, for daily bathing; Use incontinence wipes for cleaning urine, stool and caustic drainage. Foley care as needed; Monitor external devices/tubes for correct placement to prevent pressure, friction and shearing; Encourage use of lotion/moisturizer on skin; Monitor patient's hygiene practices; Consult/collaborate with wound care nurse; Utilize specialty bed; Consider placing an indwelling catheter if incontinence interferes with healing of stage 3 or 4 pressure injury

## 2018-11-16 NOTE — Plan of Care (Signed)
Problem: Safety  Goal: Patient will be free from injury during hospitalization  Outcome: Progressing     Problem: Pain  Goal: Pain at adequate level as identified by patient  Outcome: Progressing   Pt denies any pain at this time. No c/o nausea. Tolerating diet.   Problem: Moderate/High Fall Risk Score >5  Goal: Patient will remain free of falls  Outcome: Progressing   Pt up oob to bsc x 1 person assistance. Maintained nwb status to rle. Voided without difficulty.

## 2018-11-16 NOTE — Discharge Summary (Signed)
[facility] HOSPITALIST    Summary   Patient Info:   Date/Time: 11/16/2018 / 10:36 AM   Admit Date:11/15/2018  Patient Name:Amanda [name]   ZOX:[id number]   PCP: Lowanda Foster, MD  Attending Physician:Gorenshtein, Lyn Hollingshead, *      Discharge date and time: No discharge date for patient encounter.     DIAGNOSIS :   Patient Active Problem List   Diagnosis   . Nausea   . Shortness of breath   . Pneumonia due to infectious organism   . Spontaneous rupture of flexor tendon of right foot   . 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  The patient has no history of lung disease though she was raised around several smokers.  She has had no problems with pain medicines in the past.  No history of sleep apnea.  She had been having a slight cough and today time of discharge she was having more hoarseness and bringing up yellow-green sputum.  No fevers sweats or chills.    Hospital Course:  Patient did well with her surgery.  She was then placed in observation bed.  Following day her white count was still elevated at 12.4.  Vital signs were stable and she had no fevers.  Her chest x-ray done on the day of admission as well as follow-up today was significant for stable linear opacities at the lower lung and left perihilar region consistent with either small infiltrate or atelectasis.  Patient's O2 sats were 97% while in bed.  Her right leg was casted from the Achilles tendon repair.  There was no evidence of DVT.  It was felt that the patient had an early bronchitis/pneumonia and was started on Zithromax 500 mg  daily for 5 days.  She will follow-up with her PCP for follow-up chest x-ray to ensure there is resolution in 3 to 4 weeks.  I spoke with the patient's podiatrist on the phone.  He gave me clearance to discharge patient now.  He will follow-up with the patient in his office in 1 week.    Disposition:home    Condition at Discharge and Prognosis: Stable condition at the time of discharge with good prognosis  Admission Date:11/15/2018  Discharge Date: 11/16/18  Type of Admission:Hospital Outpatient Surgery (Amb Proc)    Code Status: Full Code    Objective:     Vitals:    11/15/18 2342 11/16/18 0039 11/16/18 0440 11/16/18 0839   BP: 127/69 110/67 105/65 105/55   Pulse: 82 75 71 73   Resp: 16 16 16 18    Temp: 98.8 F (37.1 C) 97.5 F (36.4 C) 98.2 F (36.8 C) 97.7 F (36.5 C)   TempSrc:       SpO2:  98% 97% 96%   Weight: 103.8 kg (228 lb 12.8 oz)      Height: 1.575 m (5' 2.01")          Discharge Medications:   Discharge Medications:     Discharge Medication List      Taking    amLODIPine 5 MG tablet  Dose:  5 mg  Commonly known  as:  NORVASC  Take 5 mg by mouth     azithromycin 500 MG tablet  Dose:  500 mg  Commonly known as:  ZITHROMAX  For:  Community Acquired Pneumonia  Take 1 tablet (500 mg total) by mouth daily for 5 days     FLUoxetine 20 MG capsule  Dose:  20 mg  Commonly known as:  PROzac  Take 20 mg by mouth     GOODSENSE ASPIRIN 325 MG tablet  Dose:  325 mg  Generic drug:  aspirin  Take 325 mg by mouth     lisinopril-hydroCHLOROthiazide 20-12.5 MG per tablet  Dose:  1 tablet  Commonly known as:  PRINZIDE,ZESTORETIC  Take 1 tablet by mouth 2 (two) times daily     oxyCODONE-acetaminophen 5-325 MG per tablet  Dose:  1 tablet  Commonly known as:  PERCOCET  Take 1 tablet by mouth every 4 (four) hours as needed for Pain     simvastatin 20 MG tablet  Commonly known as:  ZOCOR  TK 1 T PO QPM     traZODone 50 MG tablet  Commonly known as:  DESYREL  TK 1 T PO Q NIGHT            Diet: Diet cardiac    Results of  Labs/imaging:   Labs have been reviewed:   Coagulation Profile:       CBC review:   Recent Labs   Lab 11/16/18  0753 11/15/18  2118   WBC 12.64* 12.46*   Hgb 11.9 12.6   Hematocrit 33.4* 35.7   Platelets 166 149   MCV 92.3 93.2   RDW 12 12   Neutrophils 85.6  --    Lymphocytes Automated 6.9  --    Eosinophils Automated 0.1  --    Immature Granulocyte 0.6  --    Neutrophils Absolute 10.82*  --    Absolute Immature Granulocyte 0.08*  --      Chem Review:  Recent Labs   Lab 11/16/18  0753 11/15/18  2118   Sodium 139 138   Potassium 4.1 4.2   Chloride 104 103   CO2 26 25   BUN 18.7 24.1*   Creatinine 0.8 0.9   Glucose 115* 152*   Calcium 9.1 9.1   Bilirubin, Total 0.8  --    AST (SGOT) 16  --    ALT 18  --    Alkaline Phosphatase 83  --      Results     Procedure Component Value Units Date/Time    B-type Natriuretic Peptide [[id number]] Collected:  11/16/18 0753    Specimen:  Blood Updated:  11/16/18 0941     B-Natriuretic Peptide 77.6 pg/mL     Comprehensive metabolic panel [[id number]]  (Abnormal) Collected:  11/16/18 0753    Specimen:  Blood Updated:  11/16/18 0939     Glucose 115 mg/dL      BUN 28.4 mg/dL      Creatinine 0.8 mg/dL      Sodium 132 mEq/L      Potassium 4.1 mEq/L      Chloride 104 mEq/L      CO2 26 mEq/L      Calcium 9.1 mg/dL      Protein, Total 6.5 g/dL      Albumin 3.7 g/dL      AST (SGOT) 16 U/L      ALT 18 U/L      Alkaline Phosphatase 83 U/L      Bilirubin,  Total 0.8 mg/dL      Globulin 2.8 g/dL      Albumin/Globulin Ratio 1.3     Anion Gap 9.0    GFR [[id number]] Collected:  11/16/18 0753     Updated:  11/16/18 0939     EGFR >60.0    CBC and differential [[id number]]  (Abnormal) Collected:  11/16/18 0753    Specimen:  Blood Updated:  11/16/18 0904     WBC 12.64 x10 3/uL      Hgb 11.9 g/dL      Hematocrit 78.2 %      Platelets 166 x10 3/uL      RBC 3.62 x10 6/uL      MCV 92.3 fL      MCH 32.9 pg      MCHC 35.6 g/dL      RDW 12 %      MPV 11.6 fL      Neutrophils 85.6 %      Lymphocytes Automated 6.9  %      Monocytes 6.6 %      Eosinophils Automated 0.1 %      Basophils Automated 0.2 %      Immature Granulocyte 0.6 %      Nucleated RBC 0.0 /100 WBC      Neutrophils Absolute 10.82 x10 3/uL      Abs Lymph Automated 0.87 x10 3/uL      Abs Mono Automated 0.83 x10 3/uL      Abs Eos Automated 0.01 x10 3/uL      Absolute Baso Automated 0.03 x10 3/uL      Absolute Immature Granulocyte 0.08 x10 3/uL      Absolute NRBC 0.00 x10 3/uL     Basic Metabolic Panel [[id number]]  (Abnormal) Collected:  11/15/18 2118    Specimen:  Blood Updated:  11/15/18 2144     Glucose 152 mg/dL      BUN 57.8 mg/dL      Creatinine 0.9 mg/dL      Calcium 9.1 mg/dL      Sodium 469 mEq/L      Potassium 4.2 mEq/L      Chloride 103 mEq/L      CO2 25 mEq/L      Anion Gap 10.0    GFR [[id number]] Collected:  11/15/18 2118     Updated:  11/15/18 2144     EGFR >60.0    Lactic acid, plasma [[id number]] Collected:  11/15/18 2118    Specimen:  Blood Updated:  11/15/18 2139     Lactic acid 1.1 mmol/L     CBC [[id number]]  (Abnormal) Collected:  11/15/18 2118    Specimen:  Blood Updated:  11/15/18 2128     WBC 12.46 x10 3/uL      Hgb 12.6 g/dL      Hematocrit 74.2 %      Platelets 149 x10 3/uL      RBC 3.83 x10 6/uL      MCV 93.2 fL      MCH 32.9 pg      MCHC 35.3 g/dL      RDW 12 %      MPV 11.4 fL      Nucleated RBC 0.0 /100 WBC      Absolute NRBC 0.00 x10 3/uL     Glucose Whole Blood - POCT [[id number]]  (Abnormal) Collected:  11/15/18 1338     Updated:  11/15/18 1343     POCT - Glucose Whole blood 107 mg/dL  Radiology reports have been reviewed:  Radiology Results (24 Hour)     Procedure Component Value Units Date/Time    XR Chest AP Portable [[id number]] Collected:  11/16/18 1000    Order Status:  Completed Updated:  11/16/18 1008    Narrative:       History: Infiltrate. Follow-up.    COMPARISON: Radiograph 11/15/2018    Heart size and contour are normal.  Patchy linear opacities reidentified  at the right base and left perihilar region as seen on the  previous  examination. The findings could represent minimal infiltrate or  atelectasis. No effusion or pneumothorax. Heart is normal in size.  Normal vasculature.      Impression:        No change. Predominantly linear opacities at the lower lung  and left perihilar region, minimal infiltrate or atelectasis. Recommend  follow-up PA and lateral when able.    Discussed with Dr. Jarome Lamas following imaging.    Charlott Rakes, MD   11/16/2018 10:04 AM    XR Chest AP Portable [[id number]] Collected:  11/15/18 2114    Order Status:  Completed Updated:  11/15/18 2119    Narrative:       CLINICAL HISTORY: Shortness of breath.    FINDINGS: AP view of the chest. No prior studies are available for  comparison.    Cardiac silhouette is prominent but magnified by AP technique. Pulmonary  vascularity is normal.     Lungs are hypoinflated. Patchy bibasilar airspace disease, more  pronounced on the left than right could represent pneumonia or  atelectasis.      Impression:        Prominent heart and bilateral airspace disease.    Darra Lis, MD   11/15/2018 9:15 PM    US GUIDED NERVE BLOCK FOR ANESTHESIA [[id number]] Resulted:  11/15/18 1423    Order Status:  Completed Updated:  11/15/18 1423    Narrative:       An ultrasound-guided nerve block was performed. For details regarding this   procedure please refer to the anesthesia record.        US Guided Nerve Block For Anesthesia    Result Date: 11/15/2018  An ultrasound-guided nerve block was performed. For details regarding this procedure please refer to the anesthesia record.    Xr Chest Ap Portable    Result Date: 11/16/2018  History: Infiltrate. Follow-up. COMPARISON: Radiograph 11/15/2018 Heart size and contour are normal.  Patchy linear opacities reidentified at the right base and left perihilar region as seen on the previous examination. The findings could represent minimal infiltrate or atelectasis. No effusion or pneumothorax. Heart is normal in size. Normal vasculature.       No change. Predominantly linear opacities at the lower lung and left perihilar region, minimal infiltrate or atelectasis. Recommend follow-up PA and lateral when able. Discussed with Dr. Jarome Lamas following imaging. Charlott Rakes, MD 11/16/2018 10:04 AM    Xr Chest Ap Portable    Result Date: 11/15/2018  CLINICAL HISTORY: Shortness of breath. FINDINGS: AP view of the chest. No prior studies are available for comparison. Cardiac silhouette is prominent but magnified by AP technique. Pulmonary vascularity is normal. Lungs are hypoinflated. Patchy bibasilar airspace disease, more pronounced on the left than right could represent pneumonia or atelectasis.      Prominent heart and bilateral airspace disease. Darra Lis, MD 11/15/2018 9:15 PM    Pathology:   Specimens (From admission, onward)    None  Follow up recommendations:   Patient was instructed to follow up with Primary Care Doctor Lowanda Foster, MD in 3 to 4 weeks and have a follow-up chest x-ray.  And with podiatrist in 1 week  Pending Lab Results:   Labs/Images to be followed at your PCP office:   Unresulted Labs     None        Hospitalist:   Signed by: Berton Lan  11/16/2018 10:36 AM  Time spent for discharge: 50 minutes    CC: Ude-Oshiyoye, Delma Post, MD      *This note was generated by the Epic EMR system/ Dragon speech recognition and may contain inherent errors or omissions not intended by the user. Grammatical errors, random word insertions, deletions, pronoun errors and incomplete sentences are occasional consequences of this technology due to software limitations. Not all errors are caught or corrected. If there are questions or concerns about the content of this note or information contained within the body of this dictation they should be addressed directly with the author for clarification

## 2019-01-30 ENCOUNTER — Telehealth (INDEPENDENT_AMBULATORY_CARE_PROVIDER_SITE_OTHER): Payer: Self-pay | Admitting: Family Medicine

## 2019-01-30 MED ORDER — TRAZODONE 50 MG TABLET: 50 mg | Tab | Freq: Every evening | ORAL | 2 refills | 0 days | Status: AC

## 2019-01-30 MED ORDER — FLUOXETINE 20 MG CAPSULE
20.0000 mg | ORAL_CAPSULE | Freq: Every day | ORAL | 2 refills | Status: DC
Start: 2019-01-30 — End: 2019-04-19

## 2019-04-19 ENCOUNTER — Ambulatory Visit (INDEPENDENT_AMBULATORY_CARE_PROVIDER_SITE_OTHER): Payer: Medicare Other | Admitting: Family Medicine

## 2019-04-19 ENCOUNTER — Other Ambulatory Visit: Payer: Self-pay

## 2019-04-19 ENCOUNTER — Ambulatory Visit (HOSPITAL_BASED_OUTPATIENT_CLINIC_OR_DEPARTMENT_OTHER): Payer: Medicare Other | Attending: Family Medicine

## 2019-04-19 ENCOUNTER — Encounter (INDEPENDENT_AMBULATORY_CARE_PROVIDER_SITE_OTHER): Payer: Self-pay | Admitting: Family Medicine

## 2019-04-19 VITALS — BP 122/80 | HR 81 | Temp 97.6°F | Resp 12 | Ht 61.8 in | Wt 225.0 lb

## 2019-04-19 DIAGNOSIS — R079 Chest pain, unspecified: Secondary | ICD-10-CM

## 2019-04-19 DIAGNOSIS — R0609 Other forms of dyspnea: Secondary | ICD-10-CM | POA: Insufficient documentation

## 2019-04-19 DIAGNOSIS — R0600 Dyspnea, unspecified: Secondary | ICD-10-CM

## 2019-04-19 DIAGNOSIS — IMO0001 Reserved for inherently not codable concepts without codable children: Secondary | ICD-10-CM

## 2019-04-19 DIAGNOSIS — R5383 Other fatigue: Secondary | ICD-10-CM

## 2019-04-19 DIAGNOSIS — R209 Unspecified disturbances of skin sensation: Principal | ICD-10-CM

## 2019-04-19 DIAGNOSIS — M542 Cervicalgia: Secondary | ICD-10-CM

## 2019-04-19 LAB — THYROID STIMULATING HORMONE WITH FREE T4 REFLEX: TSH: 2.838 u[IU]/mL (ref 0.340–5.330)

## 2019-04-19 LAB — CBC WITH DIFF
BASOPHIL #: 0.1 10*3/uL (ref 0.00–0.10)
BASOPHIL %: 1 % (ref 0–3)
EOSINOPHIL #: 0.3 x10ˆ3/uL (ref 0.00–0.50)
EOSINOPHIL %: 3 % (ref 0–5)
HCT: 39.5 % (ref 36.0–45.0)
HGB: 14.3 g/dL (ref 12.0–15.5)
LYMPHOCYTE #: 1.9 x10ˆ3/uL (ref 1.00–4.80)
LYMPHOCYTE %: 20 % (ref 15–43)
MCH: 34 pg — ABNORMAL HIGH (ref 27.5–33.2)
MCHC: 36.1 g/dL — ABNORMAL HIGH (ref 32.0–36.0)
MCV: 94.2 fL (ref 82.0–97.0)
MONOCYTE #: 0.8 x10ˆ3/uL (ref 0.20–0.90)
MONOCYTE %: 9 % (ref 5–12)
MPV: 9.6 fL (ref 7.4–10.5)
NEUTROPHIL #: 6.4 x10ˆ3/uL (ref 1.50–6.50)
NEUTROPHIL %: 68 % (ref 43–76)
PLATELETS: 185 x10ˆ3/uL (ref 150–450)
RBC: 4.2 x10ˆ6/uL (ref 4.00–5.10)
RDW: 12.9 % (ref 11.0–16.0)
WBC: 9.4 x10ˆ3/uL (ref 4.0–11.0)

## 2019-04-19 LAB — COMPREHENSIVE METABOLIC PANEL, NON-FASTING
ALBUMIN/GLOBULIN RATIO: 1.4 (ref 0.8–2.0)
ALBUMIN/GLOBULIN RATIO: 1.4 (ref 0.8–2.0)
ALBUMIN: 4.1 g/dL (ref 3.5–5.0)
ALKALINE PHOSPHATASE: 92 U/L (ref 38–126)
ALT (SGPT): 31 U/L (ref 14–54)
ANION GAP: 9 mmol/L (ref 3–11)
AST (SGOT): 23 U/L (ref 15–41)
BILIRUBIN TOTAL: 1 mg/dL (ref 0.3–1.2)
BUN/CREA RATIO: 14 (ref 6–22)
BUN: 13 mg/dL (ref 6–20)
CALCIUM: 9.5 mg/dL (ref 8.8–10.2)
CHLORIDE: 101 mmol/L (ref 101–111)
CO2 TOTAL: 26 mmol/L (ref 22–32)
CREATININE: 0.91 mg/dL (ref 0.44–1.00)
ESTIMATED GFR: >60 mL/min/{1.73_m2} (ref >60)
GLUCOSE: 120 mg/dL — ABNORMAL HIGH (ref 70–110)
POTASSIUM: 3.5 mmol/L (ref 3.4–5.1)
PROTEIN TOTAL: 7 g/dL (ref 6.4–8.3)
SODIUM: 136 mmol/L (ref 136–145)

## 2019-04-19 LAB — VITAMIN B12: VITAMIN B 12: 263 pg/mL (ref 180–914)

## 2019-04-19 LAB — FOLATE: FOLATE: 13.2 ng/mL (ref >=4.5)

## 2019-04-19 LAB — D-DIMER: D-DIMER: 233 ng/mL — ABNORMAL HIGH (ref <=230)

## 2019-04-19 MED ORDER — VENTOLIN HFA 90 MCG/ACTUATION AEROSOL INHALER
2.00 | INHALATION_SPRAY | Freq: Four times a day (QID) | RESPIRATORY_TRACT | 0 refills | Status: DC | PRN
Start: 2019-04-19 — End: 2019-05-21

## 2019-04-19 MED ORDER — LISINOPRIL 20 MG-HYDROCHLOROTHIAZIDE 12.5 MG TABLET
1.00 | ORAL_TABLET | Freq: Two times a day (BID) | ORAL | 1 refills | Status: DC
Start: 2019-04-19 — End: 2020-04-11

## 2019-04-19 MED ORDER — SIMVASTATIN 20 MG TABLET
20.00 mg | ORAL_TABLET | Freq: Every evening | ORAL | 1 refills | Status: DC
Start: 2019-04-19 — End: 2019-10-16

## 2019-04-19 MED ORDER — AMLODIPINE 5 MG TABLET
5.0000 mg | ORAL_TABLET | Freq: Every day | ORAL | 1 refills | Status: DC
Start: 2019-04-19 — End: 2019-11-23

## 2019-04-19 MED ORDER — TRAZODONE 50 MG TABLET
50.00 mg | ORAL_TABLET | Freq: Every evening | ORAL | 2 refills | Status: DC
Start: 2019-04-19 — End: 2019-09-04

## 2019-04-19 MED ORDER — FLUOXETINE 20 MG CAPSULE
20.0000 mg | ORAL_CAPSULE | Freq: Every day | ORAL | 2 refills | Status: DC
Start: 2019-04-19 — End: 2019-08-06

## 2019-04-19 NOTE — Progress Notes (Signed)
[facility]  [address]  [place] [address]  Phone: [phone]  Fax: [phone]    Encounter Date: 04/19/2019    Patient ID:  Toni Tran  [id number]:[id number]    DOB: [birth year]  Age: 62 y.o. female    Subjective:     Chief Complaint   Patient presents with   . Medication Refill     HPI   Progressive sob, DOE, chest pain intermittently, chest pain sharp at rest. Due for med refill. Paresthesia. H/o CVA X2.   Current Outpatient Medications   Medication Sig   . amLODIPine (NORVASC) 5 mg Oral Tablet Take 1 Tab (5 mg total) by mouth Once a day AMLODIPINE BESYLATE   . aspirin 325 mg Oral Tablet Take 325 mg by mouth Once a day   . azithromycin (ZITHROMAX) 250 mg Oral Tablet Take 500 mg (2 tab) on day 1; take 250 mg (1 tab) on days 2-5.   . ergocalciferol, vitamin D2, (DRISDOL) 50,000 unit Oral Capsule Take 1 Cap (50,000 Units total) by mouth Every 7 days   . FLUoxetine (PROZAC) 20 mg Oral Capsule Take 1 Cap (20 mg total) by mouth Once a day   . lisinopriL-hydrochlorothiazide (ZESTORETIC) 20-12.5 mg Oral Tablet Take 1 Tab by mouth Twice daily   . oxyCODONE-acetaminophen (PERCOCET) 5-325 mg Oral Tablet Take 1 Tab by mouth Every 8 hours as needed for Pain   . simvastatin (ZOCOR) 20 mg Oral Tablet Take 1 Tab (20 mg total) by mouth Every evening   . traZODone (DESYREL) 50 mg Oral Tablet Take 1 Tab (50 mg total) by mouth Every night   . VENTOLIN HFA 90 mcg/actuation Inhalation HFA Aerosol Inhaler Take 2 Puffs by inhalation Every 6 hours as needed     No Known Allergies  Past Medical History:   Diagnosis Date   . Anxiety    . 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 daily   . Chronic pain     right foot/right leg   . CVA (cerebrovascular accident) (CMS 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 Depression    . Hypercholesterolemia    . Hyperlipidemia    . Hypertension    . Obesity    . Peripheral edema      right foot   . Plantar fascial fibromatosis of right foot    . PONV (postoperative nausea and vomiting)     requests Anti Emetic   . RLS (restless legs syndrome)    . Snores     Never has had sleep study   . Tarsal tunnel syndrome, right    . Vitamin D deficiency    . Wears glasses          Past Surgical History:   Procedure Laterality Date   . HX APPENDECTOMY  childhood   . HX EYE SURGERY Bilateral Childhood    Muscle Surgery   . HX SHOULDER SURGERY Right 06/2016    Exc. Bone Spur, Repair Rotator Cuff Tear   . HX TUBAL LIGATION  1992         Family Medical History:     Problem Relation (Age of Onset)    Alzheimer's/Dementia Mother, Father    Arthritis-osteo Mother, Father    Blood Clots Father    Congestive Heart Failure Father    Coronary Artery Disease Father    Diabetes Father  Heart Attack Mother, Father    High Cholesterol Mother, Father    Hypertension (High Blood Pressure) Mother, Father    Migraines Mother, Father    Sleep disorders Father    Stroke Mother, Father            Social History     Tobacco Use   . Smoking status: Never Smoker   . Smokeless tobacco: Never Used   Substance Use Topics   . Alcohol use: No   . Drug use: No       Review of Systems   Constitutional: Positive for fatigue.   HENT: Negative.    Eyes: Negative.    Respiratory: Negative.    Cardiovascular: Negative.    Gastrointestinal: Negative.    Musculoskeletal: Negative.    Skin: Negative.    Neurological: Negative.    Psychiatric/Behavioral: Negative.      Objective:   Vitals: BP 122/80   Pulse 81   Temp 36.4 C (97.6 F) (Temporal)   Resp 12   Ht 1.57 m (5' 1.8")   Wt 102 kg (225 lb)   SpO2 99%   Breastfeeding No   BMI 41.42 kg/m         Physical Exam  Constitutional:       Appearance: Normal appearance. She is obese.      Comments: Fatigue appearing   HENT:      Head: Normocephalic and atraumatic.      Right Ear: Tympanic membrane normal.      Left Ear: Tympanic membrane normal.      Nose: Nose normal.      Mouth/Throat:        Mouth: Mucous membranes are moist.   Eyes:      Extraocular Movements: Extraocular movements intact.      Pupils: Pupils are equal, round, and reactive to light.   Neck:      Musculoskeletal: Normal range of motion and neck supple.   Cardiovascular:      Rate and Rhythm: Normal rate and regular rhythm.      Pulses: Normal pulses.      Heart sounds: Normal heart sounds.   Pulmonary:      Effort: Pulmonary effort is normal.      Breath sounds: Normal breath sounds.   Abdominal:      General: Abdomen is flat. Bowel sounds are normal.   Musculoskeletal: Normal range of motion.   Skin:     General: Skin is warm.   Neurological:      General: No focal deficit present.      Mental Status: She is alert and oriented to person, place, and time.   Psychiatric:         Behavior: Behavior normal.         Thought Content: Thought content normal.         Judgment: Judgment normal.         Assessment & Plan:     ENCOUNTER DIAGNOSES     ICD-10-CM   1. Paresthesias/numbness R20.9   2. Fatigue, unspecified type R53.83   3. Neck pain M54.2   4. DOE (dyspnea on exertion) R06.09     C spine xray  Med refill  labwork ordered  PFT's    Orders Placed This Encounter   . XR CERVICAL SPINE SERIES (4 OR MORE VIEWS)   . D-DIMER   . COMPREHENSIVE METABOLIC PANEL, NON-FASTING   . CBC/DIFF   . FOLATE   . THYROID STIMULATING HORMONE WITH  FREE T4 REFLEX   . VITAMIN B12   . VITAMIN D 25, TOTAL   . amLODIPine (NORVASC) 5 mg Oral Tablet   . FLUoxetine (PROZAC) 20 mg Oral Capsule   . lisinopriL-hydrochlorothiazide (ZESTORETIC) 20-12.5 mg Oral Tablet   . simvastatin (ZOCOR) 20 mg Oral Tablet   . traZODone (DESYREL) 50 mg Oral Tablet   . VENTOLIN HFA 90 mcg/actuation Inhalation HFA Aerosol Inhaler       No follow-ups on file.    [name], MD

## 2019-04-20 ENCOUNTER — Encounter (INDEPENDENT_AMBULATORY_CARE_PROVIDER_SITE_OTHER): Payer: Self-pay

## 2019-04-20 NOTE — Nursing Note (Signed)
Prior Authorization for patient's Ventolin, insurance company stated that [employer] HFA is preferred med and needs no prior authorization.  Orvis Brill  04/20/2019, 14:47

## 2019-04-23 ENCOUNTER — Telehealth (INDEPENDENT_AMBULATORY_CARE_PROVIDER_SITE_OTHER): Payer: Self-pay | Admitting: Family Medicine

## 2019-04-23 DIAGNOSIS — R0600 Dyspnea, unspecified: Secondary | ICD-10-CM

## 2019-04-23 DIAGNOSIS — R0781 Pleurodynia: Secondary | ICD-10-CM

## 2019-04-23 DIAGNOSIS — R0609 Other forms of dyspnea: Secondary | ICD-10-CM

## 2019-04-23 LAB — VITAMIN D 25, TOTAL: VITAMIN D, 25OH: 14 ng/mL — ABNORMAL LOW (ref 30–100)

## 2019-04-23 NOTE — Progress Notes (Signed)
Called and left voice message to call back

## 2019-05-03 ENCOUNTER — Encounter (INDEPENDENT_AMBULATORY_CARE_PROVIDER_SITE_OTHER): Payer: Medicare Other | Admitting: Family Medicine

## 2019-05-15 ENCOUNTER — Telehealth (INDEPENDENT_AMBULATORY_CARE_PROVIDER_SITE_OTHER): Payer: Self-pay | Admitting: Family Medicine

## 2019-05-15 NOTE — Telephone Encounter (Signed)
Trazodone refilled.  Sharyon Cable, MA  05/15/2019, 16:36

## 2019-05-16 ENCOUNTER — Ambulatory Visit (HOSPITAL_BASED_OUTPATIENT_CLINIC_OR_DEPARTMENT_OTHER)
Admission: RE | Admit: 2019-05-16 | Discharge: 2019-05-16 | Disposition: A | Discharge status: Home / Self Care | Payer: Medicare Other | Source: Ambulatory Visit | Attending: Family Medicine | Admitting: Family Medicine

## 2019-05-16 ENCOUNTER — Other Ambulatory Visit: Payer: Self-pay

## 2019-05-16 DIAGNOSIS — R0609 Other forms of dyspnea: Secondary | ICD-10-CM

## 2019-05-16 DIAGNOSIS — R079 Chest pain, unspecified: Secondary | ICD-10-CM

## 2019-05-16 DIAGNOSIS — R0600 Dyspnea, unspecified: Secondary | ICD-10-CM

## 2019-05-16 DIAGNOSIS — M542 Cervicalgia: Secondary | ICD-10-CM

## 2019-05-16 DIAGNOSIS — I340 Nonrheumatic mitral (valve) insufficiency: Secondary | ICD-10-CM

## 2019-05-16 DIAGNOSIS — IMO0001 Reserved for inherently not codable concepts without codable children: Secondary | ICD-10-CM

## 2019-05-16 DIAGNOSIS — R209 Unspecified disturbances of skin sensation: Principal | ICD-10-CM

## 2019-05-21 ENCOUNTER — Other Ambulatory Visit (INDEPENDENT_AMBULATORY_CARE_PROVIDER_SITE_OTHER): Payer: Self-pay | Admitting: HOSPITALIST

## 2019-05-21 MED ORDER — VENTOLIN HFA 90 MCG/ACTUATION AEROSOL INHALER
2.00 | INHALATION_SPRAY | Freq: Four times a day (QID) | RESPIRATORY_TRACT | 0 refills | Status: AC | PRN
Start: 2019-05-21 — End: ?

## 2019-06-08 ENCOUNTER — Encounter (INDEPENDENT_AMBULATORY_CARE_PROVIDER_SITE_OTHER): Payer: Self-pay | Admitting: Family Medicine

## 2019-06-08 ENCOUNTER — Other Ambulatory Visit: Payer: Self-pay

## 2019-06-08 ENCOUNTER — Ambulatory Visit (INDEPENDENT_AMBULATORY_CARE_PROVIDER_SITE_OTHER): Payer: Medicare Other | Admitting: Family Medicine

## 2019-06-08 VITALS — BP 129/76 | HR 76 | Temp 98.0°F | Resp 12 | Ht 61.8 in | Wt 216.0 lb

## 2019-06-08 DIAGNOSIS — M25559 Pain in unspecified hip: Secondary | ICD-10-CM

## 2019-06-08 DIAGNOSIS — M79606 Pain in leg, unspecified: Secondary | ICD-10-CM

## 2019-06-08 DIAGNOSIS — J45909 Unspecified asthma, uncomplicated: Secondary | ICD-10-CM

## 2019-06-08 DIAGNOSIS — R0600 Dyspnea, unspecified: Secondary | ICD-10-CM

## 2019-06-08 DIAGNOSIS — R0609 Other forms of dyspnea: Secondary | ICD-10-CM

## 2019-06-08 DIAGNOSIS — R0602 Shortness of breath: Secondary | ICD-10-CM

## 2019-06-08 MED ORDER — CHOLECALCIFEROL (VITAMIN D3) 1,250 MCG (50,000 UNIT) CAPSULE
50000.00 [IU] | ORAL_CAPSULE | ORAL | 3 refills | Status: DC
Start: 2019-06-08 — End: 2019-11-16

## 2019-06-08 MED ORDER — DICLOFENAC 1 % TOPICAL GEL
Freq: Four times a day (QID) | CUTANEOUS | 1 refills | Status: DC
Start: 2019-06-08 — End: 2019-11-16

## 2019-06-08 MED ORDER — ALBUTEROL SULFATE HFA 90 MCG/ACTUATION AEROSOL INHALER
1.0000 | INHALATION_SPRAY | Freq: Four times a day (QID) | RESPIRATORY_TRACT | 3 refills | Status: DC | PRN
Start: 2019-06-08 — End: 2019-06-11

## 2019-06-08 NOTE — Progress Notes (Signed)
[facility]  [address]  [place] [id number]  Phone: [phone]  Fax: [phone]    Encounter Date: 06/08/2019    Patient ID:  Toni Tran  [id number]:[id number]    DOB: 12-12-1956  Age: 63 y.o. female    Subjective:     Chief Complaint   Patient presents with   . Follow-up     HPI   62y/o female here for follow up. Mentions pain in RLE pain in thigh and lower back. Here for lab test results. Had PFT and echo done. Mentions trauma on left side.   Current Outpatient Medications   Medication Sig   . albuterol sulfate (PROAIR HFA) 90 mcg/actuation Inhalation HFA Aerosol Inhaler Take 1-2 Puffs by inhalation Every 6 hours as needed W/ spacer   . amLODIPine (NORVASC) 5 mg Oral Tablet Take 1 Tab (5 mg total) by mouth Once a day AMLODIPINE BESYLATE   . aspirin 325 mg Oral Tablet Take 325 mg by mouth Once a day   . azithromycin (ZITHROMAX) 250 mg Oral Tablet Take 500 mg (2 tab) on day 1; take 250 mg (1 tab) on days 2-5.   . cholecalciferol, vitamin D3, 1,250 mcg (50,000 unit) Oral Capsule Take 1 Cap (50,000 Units total) by mouth Every 7 days   . diclofenac sodium (VOLTAREN) 1 % Gel by Apply Topically route Four times a day - before meals and bedtime   . ergocalciferol, vitamin D2, (DRISDOL) 50,000 unit Oral Capsule Take 1 Cap (50,000 Units total) by mouth Every 7 days   . FLUoxetine (PROZAC) 20 mg Oral Capsule Take 1 Cap (20 mg total) by mouth Once a day   . lisinopriL-hydrochlorothiazide (ZESTORETIC) 20-12.5 mg Oral Tablet Take 1 Tab by mouth Twice daily   . oxyCODONE-acetaminophen (PERCOCET) 5-325 mg Oral Tablet Take 1 Tab by mouth Every 8 hours as needed for Pain   . simvastatin (ZOCOR) 20 mg Oral Tablet Take 1 Tab (20 mg total) by mouth Every evening   . traZODone (DESYREL) 50 mg Oral Tablet Take 1 Tab (50 mg total) by mouth Every night   . VENTOLIN HFA 90 mcg/actuation Inhalation HFA Aerosol Inhaler Take 2 Puffs by inhalation Every 6 hours as needed     No Known  Allergies  Past Medical History:   Diagnosis Date   . Anxiety    . 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 daily   . Chronic pain     right foot/right leg   . 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 Depression    . Hypercholesterolemia    . Hyperlipidemia    . Hypertension    . Obesity    . Peripheral edema     right foot   . Plantar fascial fibromatosis of right foot    . PONV (postoperative nausea and vomiting)     requests Anti Emetic   . RLS (restless legs syndrome)    . Snores     Never has had sleep study   . Tarsal tunnel syndrome, right    . Vitamin D deficiency    . Wears glasses          Past Surgical History:   Procedure Laterality Date   . HX APPENDECTOMY  childhood   . HX EYE SURGERY Bilateral Childhood    Muscle Surgery   .  HX SHOULDER SURGERY Right 06/2016    Exc. Bone Spur, Repair Rotator Cuff Tear   . HX TUBAL LIGATION  1992         Family Medical History:     Problem Relation (Age of Onset)    Alzheimer's/Dementia Mother, Father    Arthritis-osteo Mother, Father    Blood Clots Father    Congestive Heart Failure Father    Coronary Artery Disease Father    Diabetes Father    Heart Attack Mother, Father    High Cholesterol Mother, Father    Hypertension (High Blood Pressure) Mother, Father    Migraines Mother, Father    Sleep disorders Father    Stroke Mother, Father            Social History     Tobacco Use   . Smoking status: Never Smoker   . Smokeless tobacco: Never Used   Substance Use Topics   . Alcohol use: No   . Drug use: No       Review of Systems   Constitutional: Positive for fatigue.   HENT: Negative.    Eyes: Negative.    Respiratory: Negative.    Cardiovascular: Negative.    Gastrointestinal: Negative.    Musculoskeletal: Positive for arthralgias, back pain and myalgias.   Skin: Negative.    Neurological: Negative.    Psychiatric/Behavioral: The patient is nervous/anxious.       Objective:   Vitals: BP 129/76   Pulse 76   Temp 36.7 C (98 F) (Thermal Scan)   Resp 12   Ht 1.57 m (5' 1.8")   Wt 98 kg (216 lb)   SpO2 98%   Breastfeeding No   BMI 39.76 kg/m         Physical Exam  Constitutional:       Appearance: Normal appearance.   HENT:      Head: Normocephalic and atraumatic.      Right Ear: Tympanic membrane normal.      Left Ear: Tympanic membrane normal.      Nose: Nose normal.      Mouth/Throat:      Mouth: Mucous membranes are moist.   Eyes:      Extraocular Movements: Extraocular movements intact.      Conjunctiva/sclera: Conjunctivae normal.      Pupils: Pupils are equal, round, and reactive to light.   Neck:      Musculoskeletal: Normal range of motion and neck supple.   Cardiovascular:      Rate and Rhythm: Normal rate and regular rhythm.      Pulses: Normal pulses.      Heart sounds: Normal heart sounds.   Pulmonary:      Effort: Pulmonary effort is normal.      Breath sounds: Normal breath sounds.   Abdominal:      General: Abdomen is flat. Bowel sounds are normal.   Musculoskeletal: Normal range of motion.      Comments: External rotation in RLE pain at groin.   Tender to direct palpation on thigh   Skin:     General: Skin is warm.   Neurological:      General: No focal deficit present.      Mental Status: She is alert and oriented to person, place, and time.   Psychiatric:         Behavior: Behavior normal.         Thought Content: Thought content normal.         Judgment:  Judgment normal.         Assessment & Plan:     ENCOUNTER DIAGNOSES     ICD-10-CM   1. Asthma, unspecified asthma severity, unspecified whether complicated, unspecified whether persistent J45.909   2. SOB (shortness of breath) R06.02   3. DOE (dyspnea on exertion) R06.09   4. Leg pain M79.606   5. Hip pain M25.559     Xray Lspine, hip and femur  Start vitamin d    Orders Placed This Encounter   . XR LUMBAR SPINE SERIES   . XR HIP RIGHT WO PELVIS 2-3 VIEWS   . XR FEMUR RIGHT   . cholecalciferol,  vitamin D3, 1,250 mcg (50,000 unit) Oral Capsule   . albuterol sulfate (PROAIR HFA) 90 mcg/actuation Inhalation HFA Aerosol Inhaler   . diclofenac sodium (VOLTAREN) 1 % Gel       Return in about 1 month (around 07/08/2019).    Lavinia Sharps, MD

## 2019-06-11 ENCOUNTER — Telehealth (INDEPENDENT_AMBULATORY_CARE_PROVIDER_SITE_OTHER): Payer: Self-pay | Admitting: Family Medicine

## 2019-06-11 MED ORDER — ALBUTEROL SULFATE HFA 90 MCG/ACTUATION AEROSOL INHALER
1.0000 | INHALATION_SPRAY | Freq: Four times a day (QID) | RESPIRATORY_TRACT | 3 refills | Status: DC | PRN
Start: 2019-06-11 — End: 2019-06-19

## 2019-06-13 ENCOUNTER — Other Ambulatory Visit: Payer: Self-pay

## 2019-06-13 ENCOUNTER — Other Ambulatory Visit (INDEPENDENT_AMBULATORY_CARE_PROVIDER_SITE_OTHER): Payer: Self-pay | Admitting: Family Medicine

## 2019-06-13 ENCOUNTER — Ambulatory Visit (HOSPITAL_BASED_OUTPATIENT_CLINIC_OR_DEPARTMENT_OTHER)
Admission: RE | Admit: 2019-06-13 | Discharge: 2019-06-13 | Disposition: A | Discharge status: Home / Self Care | Payer: Medicare Other | Source: Ambulatory Visit | Attending: Family Medicine | Admitting: Family Medicine

## 2019-06-13 ENCOUNTER — Ambulatory Visit (HOSPITAL_BASED_OUTPATIENT_CLINIC_OR_DEPARTMENT_OTHER): Payer: Medicare Other

## 2019-06-13 DIAGNOSIS — M79606 Pain in leg, unspecified: Secondary | ICD-10-CM

## 2019-06-13 DIAGNOSIS — M25559 Pain in unspecified hip: Secondary | ICD-10-CM

## 2019-06-13 DIAGNOSIS — M5137 Other intervertebral disc degeneration, lumbosacral region: Secondary | ICD-10-CM | POA: Insufficient documentation

## 2019-06-13 DIAGNOSIS — M5136 Other intervertebral disc degeneration, lumbar region: Secondary | ICD-10-CM | POA: Insufficient documentation

## 2019-06-13 DIAGNOSIS — M25851 Other specified joint disorders, right hip: Secondary | ICD-10-CM | POA: Insufficient documentation

## 2019-06-19 ENCOUNTER — Telehealth (INDEPENDENT_AMBULATORY_CARE_PROVIDER_SITE_OTHER): Payer: Self-pay | Admitting: Family Medicine

## 2019-06-19 MED ORDER — ALBUTEROL SULFATE HFA 90 MCG/ACTUATION AEROSOL INHALER
1.0000 | INHALATION_SPRAY | Freq: Four times a day (QID) | RESPIRATORY_TRACT | 3 refills | Status: DC | PRN
Start: 2019-06-19 — End: 2022-08-26

## 2019-07-09 ENCOUNTER — Encounter (INDEPENDENT_AMBULATORY_CARE_PROVIDER_SITE_OTHER): Payer: Medicare Other | Admitting: Family Medicine

## 2019-08-06 ENCOUNTER — Telehealth (INDEPENDENT_AMBULATORY_CARE_PROVIDER_SITE_OTHER): Payer: Self-pay | Admitting: Family Medicine

## 2019-08-06 MED ORDER — FLUOXETINE 20 MG CAPSULE
20.0000 mg | ORAL_CAPSULE | Freq: Every day | ORAL | 2 refills | Status: DC
Start: 2019-08-06 — End: 2019-09-04

## 2019-09-04 ENCOUNTER — Ambulatory Visit (INDEPENDENT_AMBULATORY_CARE_PROVIDER_SITE_OTHER): Payer: Medicare Other | Admitting: Family Medicine

## 2019-09-04 ENCOUNTER — Ambulatory Visit: Payer: Medicare Other | Attending: Family Medicine

## 2019-09-04 ENCOUNTER — Other Ambulatory Visit: Payer: Self-pay

## 2019-09-04 VITALS — BP 115/68 | HR 86 | Temp 98.8°F | Wt 227.0 lb

## 2019-09-04 DIAGNOSIS — J45909 Unspecified asthma, uncomplicated: Secondary | ICD-10-CM

## 2019-09-04 DIAGNOSIS — Z20828 Contact with and (suspected) exposure to other viral communicable diseases: Secondary | ICD-10-CM | POA: Insufficient documentation

## 2019-09-04 DIAGNOSIS — F321 Major depressive disorder, single episode, moderate: Secondary | ICD-10-CM

## 2019-09-04 DIAGNOSIS — R509 Fever, unspecified: Secondary | ICD-10-CM

## 2019-09-04 LAB — COVID-19 PANTHER - LAB USE ONLY: SARS-CoV-2: NOT DETECTED

## 2019-09-04 MED ORDER — TRAZODONE 100 MG TABLET
100.00 mg | ORAL_TABLET | Freq: Every evening | ORAL | 2 refills | Status: DC
Start: 2019-09-04 — End: 2019-10-16

## 2019-09-04 MED ORDER — FLUOXETINE 40 MG CAPSULE
40.0000 mg | ORAL_CAPSULE | Freq: Every day | ORAL | 2 refills | Status: DC
Start: 2019-09-04 — End: 2019-10-16

## 2019-09-04 NOTE — Progress Notes (Addendum)
[facility]  [address]  [place] [address]  Phone: [phone]  Fax: [phone]    Encounter Date: 09/04/2019    Patient ID:  Toni Tran  HRC:[id number]    DOB: [birth year]  Age: 63 y.o. female    Subjective:     Chief Complaint   Patient presents with   . Follow-up     HPI   62y/o obese female w/ h/o HTN, HLD, anxiety depression here for follow up appt. Grieving since she lost her mother. Tired, fever, myalgia, diarrhea, no hematochezia for 2wks. Very tearful.   Current Outpatient Medications   Medication Sig   . albuterol sulfate (PROAIR HFA) 90 mcg/actuation Inhalation HFA Aerosol Inhaler Take 1-2 Puffs by inhalation Every 6 hours as needed W/ spacer   . amLODIPine (NORVASC) 5 mg Oral Tablet Take 1 Tab (5 mg total) by mouth Once a day AMLODIPINE BESYLATE   . aspirin 325 mg Oral Tablet Take 325 mg by mouth Once a day   . azithromycin (ZITHROMAX) 250 mg Oral Tablet Take 500 mg (2 tab) on day 1; take 250 mg (1 tab) on days 2-5.   . cholecalciferol, vitamin D3, 1,250 mcg (50,000 unit) Oral Capsule Take 1 Cap (50,000 Units total) by mouth Every 7 days   . diclofenac sodium (VOLTAREN) 1 % Gel by Apply Topically route Four times a day - before meals and bedtime   . ergocalciferol, vitamin D2, (DRISDOL) 50,000 unit Oral Capsule Take 1 Cap (50,000 Units total) by mouth Every 7 days   . FLUoxetine (PROZAC) 40 mg Oral Capsule Take 1 Cap (40 mg total) by mouth Once a day   . lisinopriL-hydrochlorothiazide (ZESTORETIC) 20-12.5 mg Oral Tablet Take 1 Tab by mouth Twice daily   . oxyCODONE-acetaminophen (PERCOCET) 5-325 mg Oral Tablet Take 1 Tab by mouth Every 8 hours as needed for Pain   . simvastatin (ZOCOR) 20 mg Oral Tablet Take 1 Tab (20 mg total) by mouth Every evening   . traZODone (DESYREL) 100 mg Oral Tablet Take 1 Tab (100 mg total) by mouth Every night   . VENTOLIN HFA 90 mcg/actuation Inhalation HFA Aerosol Inhaler Take 2 Puffs by inhalation Every 6 hours as  needed     No Known Allergies  Past Medical History:   Diagnosis Date   . Anxiety    . 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 daily   . Chronic pain     right foot/right leg   . 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 Depression    . Hypercholesterolemia    . Hyperlipidemia    . Hypertension    . Obesity    . Peripheral edema     right foot   . Plantar fascial fibromatosis of right foot    . PONV (postoperative nausea and vomiting)     requests Anti Emetic   . RLS (restless legs syndrome)    . Snores     Never has had sleep study   . Tarsal tunnel syndrome, right    . Vitamin D deficiency    . Wears glasses          Past Surgical History:   Procedure Laterality Date   . HX APPENDECTOMY  childhood   . HX EYE SURGERY Bilateral Childhood    Muscle Surgery   .  HX SHOULDER SURGERY Right 06/2016    Exc. Bone Spur, Repair Rotator Cuff Tear   . HX TUBAL LIGATION  1992         Family Medical History:     Problem Relation (Age of Onset)    Alzheimer's/Dementia Mother, Father    Arthritis-osteo Mother, Father    Blood Clots Father    Congestive Heart Failure Father    Coronary Artery Disease Father    Diabetes Father    Heart Attack Mother, Father    High Cholesterol Mother, Father    Hypertension (High Blood Pressure) Mother, Father    Migraines Mother, Father    Sleep disorders Father    Stroke Mother, Father            Social History     Tobacco Use   . Smoking status: Never Smoker   . Smokeless tobacco: Never Used   Substance Use Topics   . Alcohol use: No   . Drug use: No       Review of Systems   Constitutional: Positive for fatigue and fever.   HENT: Positive for congestion.    Eyes: Negative.    Cardiovascular: Negative.    Gastrointestinal: Positive for diarrhea and nausea.   Musculoskeletal: Negative.    Skin: Negative.    Neurological: Negative.    Psychiatric/Behavioral: Positive for behavioral problems  and sleep disturbance.     Objective:   Vitals: BP 115/68   Pulse 86   Temp 37.1 C (98.8 F)   Wt 103 kg (227 lb)   SpO2 98%   BMI 41.79 kg/m         Physical Exam  Constitutional:       Appearance: Normal appearance.      Comments: Fatigue appearing  crying   HENT:      Head: Normocephalic and atraumatic.      Right Ear: Tympanic membrane normal.      Left Ear: Tympanic membrane normal.      Nose: Nose normal.      Mouth/Throat:      Mouth: Mucous membranes are moist.   Eyes:      Extraocular Movements: Extraocular movements intact.      Conjunctiva/sclera: Conjunctivae normal.      Pupils: Pupils are equal, round, and reactive to light.   Neck:      Musculoskeletal: Normal range of motion and neck supple.   Cardiovascular:      Rate and Rhythm: Normal rate and regular rhythm.      Pulses: Normal pulses.      Heart sounds: Normal heart sounds.   Pulmonary:      Effort: Pulmonary effort is normal.      Breath sounds: Normal breath sounds.   Abdominal:      General: Abdomen is flat. Bowel sounds are normal.      Palpations: Abdomen is soft.   Skin:     General: Skin is warm.   Neurological:      General: No focal deficit present.      Mental Status: She is alert and oriented to person, place, and time.   Psychiatric:         Behavior: Behavior normal.         Thought Content: Thought content normal.         Judgment: Judgment normal.      Comments: anxious         Assessment & Plan:     ENCOUNTER DIAGNOSES  ICD-10-CM   1. Fever, unspecified fever cause  R50.9   2. 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 Asthma, unspecified asthma severity, unspecified whether complicated, unspecified whether persistent  J45.909     covid testing done  Med refill    Orders Placed This Encounter   . COVID-19 SCREENING - OUTPATIENT ([facility])   . FLUoxetine (PROZAC) 40 mg Oral Capsule   . traZODone (DESYREL) 100 mg Oral Tablet       No follow-ups on file.    Lavinia Sharps, MD

## 2019-10-16 ENCOUNTER — Telehealth (INDEPENDENT_AMBULATORY_CARE_PROVIDER_SITE_OTHER): Payer: Medicare Other | Admitting: Family Medicine

## 2019-10-16 ENCOUNTER — Other Ambulatory Visit: Payer: Self-pay

## 2019-10-16 DIAGNOSIS — F419 Anxiety disorder, unspecified: Secondary | ICD-10-CM

## 2019-10-16 DIAGNOSIS — E785 Hyperlipidemia, unspecified: Secondary | ICD-10-CM

## 2019-10-16 DIAGNOSIS — M5030 Other cervical disc degeneration, unspecified cervical region: Secondary | ICD-10-CM

## 2019-10-16 DIAGNOSIS — I10 Essential (primary) hypertension: Secondary | ICD-10-CM

## 2019-10-16 DIAGNOSIS — F321 Major depressive disorder, single episode, moderate: Secondary | ICD-10-CM

## 2019-10-16 MED ORDER — SIMVASTATIN 20 MG TABLET
20.00 mg | ORAL_TABLET | Freq: Every evening | ORAL | 1 refills | Status: DC
Start: 2019-10-16 — End: 2020-08-20

## 2019-10-16 MED ORDER — SERTRALINE 50 MG TABLET
50.0000 mg | ORAL_TABLET | Freq: Every day | ORAL | 2 refills | Status: DC
Start: 2019-10-16 — End: 2020-08-20

## 2019-10-16 MED ORDER — TRAZODONE 150 MG TABLET
150.00 mg | ORAL_TABLET | Freq: Every evening | ORAL | 2 refills | Status: DC
Start: 2019-10-16 — End: 2019-10-18

## 2019-10-16 NOTE — Progress Notes (Signed)
[facility]  [address]  [place] [address]  Phone: [phone]  Fax: [phone]    Encounter Date: 10/16/2019    Patient ID:  Toni Tran  [id number]    DOB: 06/25/1956  Age: 63 y.o. female    Subjective:   No chief complaint on file.    HPI   63y/o female w/ h/o depression and anxiety for follow up appt. Still feeling down w/ recent loss in family. Prozac and trazodone not working. Still having issues sleeping. No suicidal or homicidal ideation. Also w/ chr neck pain and would like to get evaluated by specialist.   Current Outpatient Medications   Medication Sig   . albuterol sulfate (PROAIR HFA) 90 mcg/actuation Inhalation HFA Aerosol Inhaler Take 1-2 Puffs by inhalation Every 6 hours as needed W/ spacer   . amLODIPine (NORVASC) 5 mg Oral Tablet Take 1 Tab (5 mg total) by mouth Once a day AMLODIPINE BESYLATE   . aspirin 325 mg Oral Tablet Take 325 mg by mouth Once a day   . azithromycin (ZITHROMAX) 250 mg Oral Tablet Take 500 mg (2 tab) on day 1; take 250 mg (1 tab) on days 2-5.   . cholecalciferol, vitamin D3, 1,250 mcg (50,000 unit) Oral Capsule Take 1 Cap (50,000 Units total) by mouth Every 7 days   . diclofenac sodium (VOLTAREN) 1 % Gel by Apply Topically route Four times a day - before meals and bedtime   . ergocalciferol, vitamin D2, (DRISDOL) 50,000 unit Oral Capsule Take 1 Cap (50,000 Units total) by mouth Every 7 days   . lisinopriL-hydrochlorothiazide (ZESTORETIC) 20-12.5 mg Oral Tablet Take 1 Tab by mouth Twice daily   . oxyCODONE-acetaminophen (PERCOCET) 5-325 mg Oral Tablet Take 1 Tab by mouth Every 8 hours as needed for Pain   . sertraline (ZOLOFT) 50 mg Oral Tablet Take 1 Tab (50 mg total) by mouth Once a day   . simvastatin (ZOCOR) 20 mg Oral Tablet Take 1 Tab (20 mg total) by mouth Every evening   . traZODone (DESYREL) 150 mg Oral Tablet Take 1 Tab (150 mg total) by mouth Every night   . VENTOLIN HFA 90 mcg/actuation Inhalation HFA  Aerosol Inhaler Take 2 Puffs by inhalation Every 6 hours as needed     No Known Allergies  Past Medical History:   Diagnosis Date   . Anxiety    . 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 daily   . Chronic pain     right foot/right leg   . 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 Depression    . Hypercholesterolemia    . Hyperlipidemia    . Hypertension    . Obesity    . Peripheral edema     right foot   . Plantar fascial fibromatosis of right foot    . PONV (postoperative nausea and vomiting)     requests Anti Emetic   . RLS (restless legs syndrome)    . Snores     Never has had sleep study   . Tarsal tunnel syndrome, right    . Vitamin D deficiency    . Wears glasses          Past Surgical History:   Procedure Laterality Date   . HX APPENDECTOMY  childhood   . HX EYE SURGERY Bilateral Childhood  Muscle Surgery   . HX SHOULDER SURGERY Right 06/2016    Exc. Bone Spur, Repair Rotator Cuff Tear   . HX TUBAL LIGATION  1992         Family Medical History:     Problem Relation (Age of Onset)    Alzheimer's/Dementia Mother, Father    Arthritis-osteo Mother, Father    Blood Clots Father    Congestive Heart Failure Father    Coronary Artery Disease Father    Diabetes Father    Heart Attack Mother, Father    High Cholesterol Mother, Father    Hypertension (High Blood Pressure) Mother, Father    Migraines Mother, Father    Sleep disorders Father    Stroke Mother, Father            Social History     Tobacco Use   . Smoking status: Never Smoker   . Smokeless tobacco: Never Used   Substance Use Topics   . Alcohol use: No   . Drug use: No       Review of Systems   Constitutional: Positive for fatigue.   HENT: Negative.    Eyes: Negative.    Respiratory: Negative.    Cardiovascular: Negative.    Gastrointestinal: Negative.    Musculoskeletal: Positive for neck pain.   Skin: Negative.    Neurological: Negative.       Psychiatric/Behavioral: Positive for sleep disturbance. The patient is nervous/anxious.      Objective:   Vitals: There were no vitals taken for this visit.        Physical Exam  Constitutional:       Appearance: Normal appearance.   HENT:      Head: Normocephalic and atraumatic.   Neurological:      General: No focal deficit present.      Mental Status: She is alert and oriented to person, place, and time.   Psychiatric:         Behavior: Behavior normal.         Thought Content: Thought content normal.         Judgment: Judgment normal.         Assessment & Plan:     ENCOUNTER DIAGNOSES     ICD-10-CM   1. 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 Anxiety  F41.9   3. DDD (degenerative disc disease), cervical  M50.30   4. Hypertension, unspecified type  I10   5. Hyperlipidemia, unspecified hyperlipidemia type  E78.5     Med refill  incr trazodone  Switch fluoxetine to Zoloft  Mri cspine  Orders Placed This Encounter   . MRI SPINE CERVICAL WO CONTRAST   . simvastatin (ZOCOR) 20 mg Oral Tablet   . traZODone (DESYREL) 150 mg Oral Tablet   . sertraline (ZOLOFT) 50 mg Oral Tablet       No follow-ups on file.    Lavinia Sharps, MD

## 2019-10-18 ENCOUNTER — Other Ambulatory Visit (INDEPENDENT_AMBULATORY_CARE_PROVIDER_SITE_OTHER): Payer: Self-pay | Admitting: HOSPITALIST

## 2019-10-18 ENCOUNTER — Other Ambulatory Visit (INDEPENDENT_AMBULATORY_CARE_PROVIDER_SITE_OTHER): Payer: Self-pay | Admitting: Family Medicine

## 2019-10-18 MED ORDER — TRAZODONE 150 MG TABLET
150.00 mg | ORAL_TABLET | Freq: Every evening | ORAL | 2 refills | Status: DC
Start: 2019-10-18 — End: 2019-10-21

## 2019-10-30 ENCOUNTER — Ambulatory Visit
Admission: RE | Admit: 2019-10-30 | Discharge: 2019-10-30 | Disposition: A | Discharge status: Home / Self Care | Payer: Medicare Other | Source: Ambulatory Visit | Attending: Family Medicine | Admitting: Family Medicine

## 2019-10-30 ENCOUNTER — Other Ambulatory Visit: Payer: Self-pay

## 2019-10-30 DIAGNOSIS — M2578 Osteophyte, vertebrae: Secondary | ICD-10-CM | POA: Insufficient documentation

## 2019-10-30 DIAGNOSIS — M47812 Spondylosis without myelopathy or radiculopathy, cervical region: Secondary | ICD-10-CM | POA: Insufficient documentation

## 2019-10-30 DIAGNOSIS — M4802 Spinal stenosis, cervical region: Secondary | ICD-10-CM | POA: Insufficient documentation

## 2019-10-30 DIAGNOSIS — M50222 Other cervical disc displacement at C5-C6 level: Secondary | ICD-10-CM | POA: Insufficient documentation

## 2019-10-30 DIAGNOSIS — M5030 Other cervical disc degeneration, unspecified cervical region: Secondary | ICD-10-CM

## 2019-11-06 ENCOUNTER — Encounter (INDEPENDENT_AMBULATORY_CARE_PROVIDER_SITE_OTHER): Payer: Self-pay | Admitting: Family Medicine

## 2019-11-06 ENCOUNTER — Other Ambulatory Visit: Payer: Self-pay

## 2019-11-06 ENCOUNTER — Ambulatory Visit (INDEPENDENT_AMBULATORY_CARE_PROVIDER_SITE_OTHER): Payer: Medicare Other | Admitting: Family Medicine

## 2019-11-06 VITALS — BP 108/76 | HR 75 | Temp 96.7°F | Resp 12 | Ht 61.8 in | Wt 225.0 lb

## 2019-11-06 DIAGNOSIS — M5030 Other cervical disc degeneration, unspecified cervical region: Secondary | ICD-10-CM

## 2019-11-06 DIAGNOSIS — I10 Essential (primary) hypertension: Secondary | ICD-10-CM

## 2019-11-06 DIAGNOSIS — F419 Anxiety disorder, unspecified: Secondary | ICD-10-CM

## 2019-11-06 NOTE — Progress Notes (Signed)
[facility]  [address]  [place] [address]  Phone: [phone]  Fax: [phone]    Encounter Date: 11/06/2019    Patient ID:  Toni Tran  EKC:[id number]    DOB: 12/15/1955  Age: 63 y.o. female    Subjective:     Chief Complaint   Patient presents with   . Follow-up     HPI   Mood better. Doing very well on Zoloft. No suicidal or homicidal ideation. Compliant w/ medication. Pain in neck, shoulder and arms. Numb and tender to light touch. Burning, numbness and hand. Limited ROM in arms and neck due to pain.   Current Outpatient Medications   Medication Sig   . albuterol sulfate (PROAIR HFA) 90 mcg/actuation Inhalation HFA Aerosol Inhaler Take 1-2 Puffs by inhalation Every 6 hours as needed W/ spacer   . amLODIPine (NORVASC) 5 mg Oral Tablet Take 1 Tab (5 mg total) by mouth Once a day AMLODIPINE BESYLATE   . aspirin 325 mg Oral Tablet Take 325 mg by mouth Once a day   . azithromycin (ZITHROMAX) 250 mg Oral Tablet Take 500 mg (2 tab) on day 1; take 250 mg (1 tab) on days 2-5.   . cholecalciferol, vitamin D3, 1,250 mcg (50,000 unit) Oral Capsule Take 1 Cap (50,000 Units total) by mouth Every 7 days   . diclofenac sodium (VOLTAREN) 1 % Gel by Apply Topically route Four times a day - before meals and bedtime   . ergocalciferol, vitamin D2, (DRISDOL) 50,000 unit Oral Capsule Take 1 Cap (50,000 Units total) by mouth Every 7 days   . lisinopriL-hydrochlorothiazide (ZESTORETIC) 20-12.5 mg Oral Tablet Take 1 Tab by mouth Twice daily   . oxyCODONE-acetaminophen (PERCOCET) 5-325 mg Oral Tablet Take 1 Tab by mouth Every 8 hours as needed for Pain   . sertraline (ZOLOFT) 50 mg Oral Tablet Take 1 Tab (50 mg total) by mouth Once a day   . simvastatin (ZOCOR) 20 mg Oral Tablet Take 1 Tab (20 mg total) by mouth Every evening   . traZODone (DESYREL) 150 mg Oral Tablet TAKE 1 TABLET(150 MG) BY MOUTH EVERY NIGHT   . VENTOLIN HFA 90 mcg/actuation Inhalation HFA Aerosol Inhaler Take  2 Puffs by inhalation Every 6 hours as needed     No Known Allergies  Past Medical History:   Diagnosis Date   . Anxiety    . 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 daily   . Chronic pain     right foot/right leg   . 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 Depression    . Hypercholesterolemia    . Hyperlipidemia    . Hypertension    . Obesity    . Peripheral edema     right foot   . Plantar fascial fibromatosis of right foot    . PONV (postoperative nausea and vomiting)     requests Anti Emetic   . RLS (restless legs syndrome)    . Snores     Never has had sleep study   . Tarsal tunnel syndrome, right    . Vitamin D deficiency    . Wears glasses          Past Surgical History:   Procedure Laterality Date   . HX APPENDECTOMY  childhood   . HX EYE SURGERY Bilateral Childhood  Muscle Surgery   . HX SHOULDER SURGERY Right 06/2016    Exc. Bone Spur, Repair Rotator Cuff Tear   . HX TUBAL LIGATION  1992         Family Medical History:     Problem Relation (Age of Onset)    Alzheimer's/Dementia Mother, Father    Arthritis-osteo Mother, Father    Blood Clots Father    Congestive Heart Failure Father    Coronary Artery Disease Father    Diabetes Father    Heart Attack Mother, Father    High Cholesterol Mother, Father    Hypertension (High Blood Pressure) Mother, Father    Migraines Mother, Father    Sleep disorders Father    Stroke Mother, Father            Social History     Tobacco Use   . Smoking status: Never Smoker   . Smokeless tobacco: Never Used   Substance Use Topics   . Alcohol use: No   . Drug use: No       Review of Systems   Constitutional: Positive for fatigue.   HENT: Negative.    Eyes: Negative.    Respiratory: Negative.    Cardiovascular: Negative.    Gastrointestinal: Negative.    Musculoskeletal: Positive for arthralgias, myalgias and neck pain.   Skin: Negative.    Neurological: Negative.       Psychiatric/Behavioral: Negative.      Objective:   Vitals: BP 108/76   Pulse 75   Temp 35.9 C (96.7 F)   Resp 12   Ht 1.57 m (5' 1.8")   Wt 102 kg (225 lb)   SpO2 97%   Breastfeeding No   BMI 41.42 kg/m         Physical Exam  Constitutional:       Appearance: Normal appearance. She is obese.   HENT:      Head: Normocephalic and atraumatic.      Right Ear: Tympanic membrane normal.      Left Ear: Tympanic membrane normal.      Nose: Nose normal.      Mouth/Throat:      Mouth: Mucous membranes are moist.   Eyes:      Extraocular Movements: Extraocular movements intact.      Conjunctiva/sclera: Conjunctivae normal.      Pupils: Pupils are equal, round, and reactive to light.   Neck:      Musculoskeletal: Normal range of motion and neck supple.   Cardiovascular:      Rate and Rhythm: Normal rate and regular rhythm.      Pulses: Normal pulses.      Heart sounds: Normal heart sounds.   Pulmonary:      Effort: Pulmonary effort is normal.      Breath sounds: Normal breath sounds.   Abdominal:      General: Abdomen is flat. Bowel sounds are normal.      Palpations: Abdomen is soft.   Musculoskeletal: Normal range of motion.   Skin:     General: Skin is warm.   Neurological:      General: No focal deficit present.      Mental Status: She is alert and oriented to person, place, and time.   Psychiatric:         Behavior: Behavior normal.         Thought Content: Thought content normal.         Judgment: Judgment normal.  Comments: Calmer, happier         Assessment & Plan:     ENCOUNTER DIAGNOSES     ICD-10-CM   1. DDD (degenerative disc disease), cervical  M50.30   2. Anxiety  F41.9   3. Hypertension, unspecified type  I10     Referral to neurosurgery and pain management  Follow up in 72months.   Orders Placed This Encounter   . Refer to External Provider   . Refer to External Provider       Return in about 3 months (around 02/06/2020).    Lavinia Sharps, MD

## 2019-11-16 ENCOUNTER — Ambulatory Visit (INDEPENDENT_AMBULATORY_CARE_PROVIDER_SITE_OTHER): Payer: Medicare Other | Admitting: Neurological Surgery

## 2019-11-16 ENCOUNTER — Encounter (INDEPENDENT_AMBULATORY_CARE_PROVIDER_SITE_OTHER): Payer: Self-pay | Admitting: Neurological Surgery

## 2019-11-16 ENCOUNTER — Other Ambulatory Visit: Payer: Self-pay

## 2019-11-16 VITALS — BP 149/83 | HR 84 | Ht 61.0 in | Wt 225.0 lb

## 2019-11-16 DIAGNOSIS — M542 Cervicalgia: Secondary | ICD-10-CM

## 2019-11-16 DIAGNOSIS — M25519 Pain in unspecified shoulder: Secondary | ICD-10-CM

## 2019-11-16 NOTE — H&P (Signed)
[facility]  [address] [place] [address]  [phone]    New Patient Visit       Patient: Toni Tran  D.O.B.: 1955-12-16  MRN#: [id number]  Date of Service: 11/16/2019    Referring physician: [name], MD    Gender: female    Chief Complaint:   Chief Complaint   Patient presents with   . Arm Pain   . Neck Pain   . Hand/Wrist Numbness/Tingling   . Weakness     History is provided by patient    History of Present Illness  This patient is a 63 year old female who presents initial consultation with neck and bilateral shoulder pain.  The patient reports that she has had pain over the last few months that flared up when she fell 3 different times about 4 months ago landing on her left side.  She says the pain is worse on the left than the right.  She says that the pain is mostly in her shoulders that her with movement.  In addition she has pain all the way around her neck and feels a cracking type sensation when she moves her neck.  The patient denies weakness, paresthesias, or issues with bowel or bladder.    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    Past History  Current Outpatient Medications   Medication Sig   . albuterol sulfate (PROAIR HFA) 90 mcg/actuation Inhalation HFA Aerosol Inhaler Take 1-2 Puffs by inhalation Every 6 hours as needed W/ spacer   . amLODIPine (NORVASC) 5 mg Oral Tablet Take 1 Tab (5 mg total) by mouth Once a day AMLODIPINE BESYLATE   . aspirin 325 mg Oral Tablet Take 325 mg by mouth Once a day   . lisinopriL-hydrochlorothiazide (ZESTORETIC) 20-12.5 mg Oral Tablet Take 1 Tab by mouth Twice daily   . sertraline (ZOLOFT) 50 mg Oral Tablet Take 1 Tab (50 mg total) by mouth Once a day   . simvastatin (ZOCOR) 20 mg Oral Tablet Take 1 Tab (20 mg total) by mouth Every evening   . traZODone (DESYREL) 150 mg Oral Tablet TAKE 1 TABLET(150 MG) BY MOUTH EVERY  NIGHT   . VENTOLIN HFA 90 mcg/actuation Inhalation HFA Aerosol Inhaler Take 2 Puffs by inhalation Every 6 hours as needed     No Known Allergies  Past Medical History:   Diagnosis Date   . Anxiety    . 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 daily   . Chronic pain     right foot/right leg   . 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 Depression    . Hypercholesterolemia    . Hyperlipidemia    . Hypertension    . Obesity    . Peripheral edema     right foot   . Plantar fascial fibromatosis of right foot    . PONV (postoperative nausea and vomiting)     requests Anti Emetic   . RLS (restless legs syndrome)    . Snores     Never has had sleep study   . Tarsal tunnel syndrome, right    . Vitamin D deficiency    . Wears glasses          Past Surgical History:  Procedure Laterality Date   . FASCIOTOMY PLANTAR Right 06/07/2017    [facility]ra FlakeAcree, John Thomas[facility]   . HX APPENDECTOMY  childhood   . HX EYE SURGERY Bilateral Childhood    Muscle Surgery   . HX SHOULDER SURGERY Right 06/2016    Exc. Bone Spur, Repair Rotator Cuff Tear   . HX TUBAL LIGATION  1992   . RELEASE TARSAL TUNNEL Right 06/07/2017    Per[facility] Thera FlakeAcree, John Thomas[facility]     Family History  Family Medical History:     Problem Relation (Age of Onset)    Alzheimer's/Dementia Mother, Father    Arthritis-osteo Mother, Father    Blood Clots Father    Congestive Heart Failure Father    Coronary Artery Disease Father    Diabetes Father    Heart Attack Mother, Father    High Cholesterol Mother, Father    Hypertension (High Blood Pressure) Mother, Father    Migraines Mother, Father    Sleep disorders Father    Stroke Mother, Father            Social History  Social History     Socioeconomic History   . Marital status: Single     Spouse name: Not on file   . Number of  children: Not on file   . Years of education: Not on file   . Highest education level: Not on file   Tobacco Use   . Smoking status: Never Smoker   . Smokeless tobacco: Never Used   Substance and Sexual Activity   . Alcohol use: No   . Drug use: No   Other Topics Concern   . Ability to Walk 2 Flight of Steps without SOB/CP Yes     Comment: Difficulty in climbing steps sec. to foot pain, denies chest pain or SOB   . Ability To Do Own ADL's Yes     Review of Systems    Pertinent positives and negatives are noted in the history of present illness, all other systems negative    Physical Examination  BP (!) 149/83   Pulse 84   Ht 1.549 m (5\' 1" )   Wt 102 kg (225 lb)   BMI 42.51 kg/m         Constitutional: Weight: well-nourished.   Ambulation: ambulates independently  Head: Size/Trauma: normocephalic and atraumatic.   Mental Status: Orientation oriented to person, place, problem, and time. Mood/Affect: appropriate mood and affect.   Neck: Appearance/Palpation/Auscultation: supple.   Heart: Rate And Rhythm: RRR. No rubs, murmurs nor gallops.  Pulmonary: Clear breath sounds bilaterally, no accessory muscle use  Skin: Inspection: no cyanosis, jaundice, lesions, rashes, or tattoos. Extremity Edema Right none. Extremity Edema Left none.   Spine:  Severe tenderness to palpation the bilateral trapezius muscles as well as the bilateral shoulders.  Neuro: Alert and Oriented to person, time, place, and condition. CN 2-12 intact, PERRLA, OU 3-2-2, face symmetrical, tongue, uvula an djaw midline.  Motor Exam: Right Upper Extremity: normal bulk and tone. Left Upper Extremity: normal bulk and tone. Right Shoulder Strength: FROM, abduction 5/5, internal rotation 5/5, and external rotation 5/5. Left Shoulder Strength: FROM, abduction 5/5, internal rotation 5/5, and external rotation 5/5. Right Elbow Strength: FROM, flexion 5/5, and extension 5/5. Left Elbow Strength: FROM, flexion 5/5, and extension 5/5. Right Wrist: FROM, flexion  5/5, and extension 5/5. Left Wrist: FROM, flexion 5/5, and extension  5/5. Right Hand: FROM and hand grip 5/5. Left Hand: FROM and hand grip 5/5. Right Lower Extremity: normal bulk. Left Lower Extremity: normal bulk. Right Hip: FROM, flexion 5/5, abduction 5/5, and adduction 5/5. Left Hip: FROM, flexion 5/5, abduction 5/5, and adduction 5/5. Right Knee: FROM, flexion 5/5, and extension 5/5. Left Knee: FROM, flexion 5/5, and extension 5/5. Right Ankle: FROM, plantar flexion 5/5, and dorsiflexion 5/5. Left Ankle: FROM, plantar flexion 5/5, and dorsiflexion 5/5.   Reflexes: Reflexes Right: biceps 2/4, triceps 2/4, brachial radialis 2/4, patellar 2/4, and achilles 2/4. Reflexes Left: biceps 2/4, triceps 2/4, brachial radialis 2/4, patellar 2/4, and achilles 2/4. Hoffmans Reflex Right: absent. Hoffmans Reflex Left: absent.   Coordination: normal finger-to-nose and heel-to-shin, no clonus or pronator drift, and rapid alternating movements normal.   Gait/Posture: tiptoe normal, heel walk normal, tandem gait normal, gait normal, and station normal.         Review of Radiological tests:  I have reviewed the following:  MRI cervical spine:IMPRESSION:    Mild to moderate disc disease at C5-6 and C6-7 as described above      Diagnosis  ENCOUNTER DIAGNOSES     ICD-10-CM   1. Neck pain  M54.2   2. Shoulder pain, unspecified chronicity, unspecified laterality  M25.519       Treatment Plan  This patient is a 63 year old female presenting initial consultation with neck pain and bilateral shoulder pain.  On examination, she is neurologically intact.  She has severe muscle spasms in her bilateral trapezius muscle.  She also has tenderness to palpation in her bilateral shoulders.  However she has full strength on shoulder abduction bilaterally.  Her MRI imaging shows mild cervical spondylosis.  I discussed with the patient in detail the recommendation for conservative management.  I have referred her to Dr. Hervey Ard in Orthopedics to  evaluate her shoulders.  In addition I referred her to physical therapy for cervical core strengthening and massage.  She will see me on a p.r.n. basis.  The patient is to call with any questions or concerns.    The patient was given the opportunity to ask questions and those questions were answered to the patient's satisfaction. The patient was encouraged to call with any additional questions or concerns. Instructed patient to call back if symptoms worse.    Over 30 minutes were spent with the patient, of which greater than 50% of that time was spent face-to-face discussing their condition, various treatment options, and plans of care.  All questions were answered and all pertinent studies were reviewed with the patient.    Portions of this note may be dictated using voice recognition software or a dictation service. Variances in spelling and vocabulary are possible and unintentional. Not all errors are caught/corrected. Please notify the [facility] if any discrepancies are noted or if the meaning of any statement is not clear.       Orders Placed This Encounter   . Refer to [facility]   . PHYSICAL THERAPY (EVALUATE AND TREAT)       Return if symptoms worsen or fail to improve.    Patient was seen independently.     Particia Jasper, MD

## 2019-11-19 ENCOUNTER — Telehealth (INDEPENDENT_AMBULATORY_CARE_PROVIDER_SITE_OTHER): Payer: Self-pay | Admitting: Family Medicine

## 2019-11-19 DIAGNOSIS — M25519 Pain in unspecified shoulder: Secondary | ICD-10-CM

## 2019-11-19 DIAGNOSIS — M542 Cervicalgia: Secondary | ICD-10-CM

## 2019-11-19 NOTE — Progress Notes (Signed)
Spoke to Dr Ebony Hail who recommends PT referral for neck and shoulder pain. He also referred pt to ortho Dr Hervey Ard.

## 2019-11-23 ENCOUNTER — Telehealth (INDEPENDENT_AMBULATORY_CARE_PROVIDER_SITE_OTHER): Payer: Self-pay | Admitting: Family Medicine

## 2019-11-23 MED ORDER — AMLODIPINE 5 MG TABLET
5.0000 mg | ORAL_TABLET | Freq: Every day | ORAL | 1 refills | Status: DC
Start: 2019-11-23 — End: 2020-01-08

## 2019-12-06 ENCOUNTER — Ambulatory Visit (HOSPITAL_COMMUNITY): Payer: Self-pay

## 2019-12-14 DIAGNOSIS — A4902 Methicillin resistant Staphylococcus aureus infection, unspecified site: Secondary | ICD-10-CM

## 2019-12-14 HISTORY — DX: Methicillin resistant Staphylococcus aureus infection, unspecified site: A49.02

## 2020-01-07 ENCOUNTER — Ambulatory Visit (INDEPENDENT_AMBULATORY_CARE_PROVIDER_SITE_OTHER): Payer: Medicare Other | Admitting: Orthopaedic Surgery

## 2020-01-07 ENCOUNTER — Encounter (INDEPENDENT_AMBULATORY_CARE_PROVIDER_SITE_OTHER): Payer: Medicare Other

## 2020-01-07 ENCOUNTER — Encounter (HOSPITAL_BASED_OUTPATIENT_CLINIC_OR_DEPARTMENT_OTHER): Payer: Self-pay | Admitting: Orthopaedic Surgery

## 2020-01-07 ENCOUNTER — Other Ambulatory Visit: Payer: Self-pay

## 2020-01-07 VITALS — BP 144/82 | Temp 96.9°F | Ht 61.0 in | Wt 225.0 lb

## 2020-01-07 DIAGNOSIS — M7502 Adhesive capsulitis of left shoulder: Secondary | ICD-10-CM

## 2020-01-07 DIAGNOSIS — M67911 Unspecified disorder of synovium and tendon, right shoulder: Secondary | ICD-10-CM

## 2020-01-07 DIAGNOSIS — M25519 Pain in unspecified shoulder: Secondary | ICD-10-CM

## 2020-01-07 MED ORDER — IBUPROFEN 800 MG TABLET
800.00 mg | ORAL_TABLET | Freq: Two times a day (BID) | ORAL | 1 refills | Status: DC | PRN
Start: 2020-01-07 — End: 2020-05-07

## 2020-01-07 MED ORDER — TIZANIDINE 4 MG TABLET
4.00 mg | ORAL_TABLET | Freq: Two times a day (BID) | ORAL | 0 refills | Status: AC | PRN
Start: 2020-01-07 — End: 2020-02-06

## 2020-01-07 MED ORDER — TIZANIDINE 4 MG CAPSULE
4.00 mg | ORAL_CAPSULE | Freq: Three times a day (TID) | ORAL | 0 refills | Status: DC | PRN
Start: 2020-01-07 — End: 2020-01-07

## 2020-01-07 NOTE — Addendum Note (Signed)
Addended by: Karleen Dolphin on: 01/07/2020 01:01 PM     Modules accepted: Orders

## 2020-01-07 NOTE — Progress Notes (Signed)
[facility] [facility]  [address]  [place] [address]  Dept: [phone]  Dept Fax: [phone]  Loc: [phone]  Loc Fax: [phone]    CC:    Chief Complaint   Patient presents with   . Shoulder Pain     BILATERAL SHOULDER PAIN L>R       HPI:   Toni Tran is a 64 y.o. female who presents to the clinic for with left greater than right shoulder pain.  She is right-handed.  Her left shoulder been problematic for 6 months.  Before that it was not.  She can not reach to put anything into her recovered.    Six years ago she had a bone removed in a small tear fixed by open approach, right shoulder.  This done in [place].  This is it is now still more uncomfortable lately.  Overhead activities is also an issue.  No recent injuries.    The left arm gets so painful that she gets some numbness in her hand.  It is sharp at times.  She sleeps poorly.    ROS:  GENERAL: No fever or chills  HEENT: no current nosebleed; no new onset visual disturbances  NECK: no swelling  CARDIOVASCULAR:  No severe chest pain  RESPIRATORY:  No severe SOA  ABDOMINAL: no severe abdominal pain  MSK: as per HPI  NEURO: no severe headache; as per HPI  SKIN: no current major rash  HEME: no major bruising or bleeding issues  EXTREMITIES: as per HPI  PSYCH: no delusions/hallucinations    PMH:   Past Medical History:   Diagnosis Date   . Anxiety    . 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 daily   . Chronic pain     right foot/right leg   . 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 Depression    . Hypercholesterolemia    . Hyperlipidemia    . Hypertension    . Obesity    . Peripheral edema     right foot   . Plantar fascial fibromatosis of right foot    . PONV (postoperative nausea and vomiting)     requests Anti Emetic   . RLS (restless legs syndrome)    . Snores     Never has  had sleep study   . Tarsal tunnel syndrome, right    . Vitamin D deficiency    . Wears glasses          Past Surgical History:   Procedure Laterality Date   . HX APPENDECTOMY  childhood   . HX EYE SURGERY Bilateral Childhood    Muscle Surgery   . HX SHOULDER SURGERY Right 06/2016    Exc. Bone Spur, Repair Rotator Cuff Tear   . HX TUBAL LIGATION  1992          Patient Active Problem List    Diagnosis   . Chest pain, pleuritic   . Acute bronchitis   . Plantar fasciitis of right foot   . Hypertension   . Hyperlipidemia   . Vitamin D deficiency   . CVA (cerebrovascular accident) (CMS [facility])   . Anxiety   . Depression   . Obesity   . Plantar fascial fibromatosis of right foot   . Tarsal tunnel syndrome of right side  MEDICATIONS:  Current Outpatient Medications   Medication Sig   . albuterol sulfate (PROAIR HFA) 90 mcg/actuation Inhalation HFA Aerosol Inhaler Take 1-2 Puffs by inhalation Every 6 hours as needed W/ spacer   . amLODIPine (NORVASC) 5 mg Oral Tablet Take 1 Tab (5 mg total) by mouth Once a day AMLODIPINE BESYLATE   . aspirin 325 mg Oral Tablet Take 325 mg by mouth Once a day   . lisinopriL-hydrochlorothiazide (ZESTORETIC) 20-12.5 mg Oral Tablet Take 1 Tab by mouth Twice daily   . sertraline (ZOLOFT) 50 mg Oral Tablet Take 1 Tab (50 mg total) by mouth Once a day (Patient not taking: Reported on 01/07/2020)   . simvastatin (ZOCOR) 20 mg Oral Tablet Take 1 Tab (20 mg total) by mouth Every evening   . traZODone (DESYREL) 150 mg Oral Tablet TAKE 1 TABLET(150 MG) BY MOUTH EVERY NIGHT   . VENTOLIN HFA 90 mcg/actuation Inhalation HFA Aerosol Inhaler Take 2 Puffs by inhalation Every 6 hours as needed        ALLERGIES:   No Known Allergies     FAMILY HISTORY:  Family Medical History:     Problem Relation (Age of Onset)    Alzheimer's/Dementia Mother, Father    Arthritis-osteo Mother, Father    Blood Clots Father    Congestive Heart Failure Father    Coronary Artery Disease Father    Diabetes Father    Heart Attack  Mother, Father    High Cholesterol Mother, Father    Hypertension (High Blood Pressure) Mother, Father    Migraines Mother, Father    Sleep disorders Father    Stroke Mother, Father              SOCIAL HISTORY:   reports that she has never smoked. She has never used smokeless tobacco. She reports that she does not drink alcohol or use drugs.    PHYSICAL EXAM:  BP (!) 144/82   Temp 36.1 C (96.9 F) (Thermal Scan)   Ht 1.549 m (5\' 1" )   Wt 102 kg (225 lb)   BMI 42.51 kg/m       GENERAL:  No acute distress; clean, appropriately dressed; well-nourished  HEENT: head normocephalic, atraumatic; nose w/out rhinorrhea/epistaxis  EYES: EOMI; vision intact   NECK: no JVD or obvious swelling  CARDIOVASCULAR: no pallor or cyanosis  RESPIRATORY:  non-labored breathing; symmetric chest wall rise bilat; no audible wheezing  ABDOMEN: no severe distention  NEURO:  Alert and oriented x3; conversant  EXTREMITIES: no diffuse obvious swelling  HEME: no diffuse obvious bruising or bleeding  PSYCH: normal affect and mood  MSK:  The left shoulder forward flexion about 45.  Abduction about 30.  Internal rotation to barely hip pocket.  Passive forward flexion to about 60.  She has lot of pain all around.  There is discomfort around her trapezius, medial border scapula, the acromioclavicular joint.  She can touch the opposite shoulder with her fingertips.    The right shoulder has along anterior cicatrix about 4 in.  The skin is healed.  Forward flexion to about 130.  Internal rotation to about T12.  External rotation about 60. She has pain with resisted external rotation with power about 5-.  Biceps is a bit uncomfortable.  She also has tightness around the trapezius and medial border scapula.    IMAGING:   Today 3 images of the right shoulder obtained.  She seems to have some calcific changes in her cuff is well as some previous  acromioplasty images.  There is some changes about her acromioclavicular joint.  Joint surfaces  maintained.    On the left shoulder three views today there are small excrescence around the acromion laterally.  Joint surfaces maintained.  No evidence of previous surgery or calcific tendinitis.    Images were personally reviewed and independently interpreted by me at today's visit.   Priscille Kluver, DO  01/07/2020, 08:42       ASSESSMENT/PLAN:    ICD-10-CM    1. Adhesive capsulitis of left shoulder  M75.02 OCCUPATIONAL THERAPY (EVALUATE AND TREAT)     ASPIRATION/INJECTION MAJOR JOINT/BURSA (AMB ONLY)   2. Shoulder pain, unspecified chronicity, unspecified laterality  M25.519 Refer to [facility]     XR SHOULDER BILATERAL     OCCUPATIONAL THERAPY (EVALUATE AND TREAT)     ASPIRATION/INJECTION MAJOR JOINT/BURSA (AMB ONLY)   3. 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  She has quite a bit of soft tissue tension.  Will get her a course of occupational therapy, steroid injection for her left shoulder joint, a muscle relaxer and Motrin.    She work diligently for 6 weeks to therapy and I will see her back at about that time for review.  Therapy may take months.    Please see procedure note      Priscille Kluver, DO  01/07/2020, 08:42    Portions of this note may be dictated using voice recognition software or a dictation service. Variances in spelling and vocabulary are possible and unintentional. Not all errors are caught/corrected. Please notify the [facility] if any discrepancies are noted or if the meaning of any statement is not clear.

## 2020-01-07 NOTE — Procedures (Signed)
With her permission and understanding, aseptic technique was utilized to inject the anterior glenohumeral joint on the left.  This done with Depo-Medrol 80 mg and 3 mL lidocaine.

## 2020-01-08 ENCOUNTER — Telehealth (INDEPENDENT_AMBULATORY_CARE_PROVIDER_SITE_OTHER): Payer: Self-pay | Admitting: Family Medicine

## 2020-01-08 MED ORDER — AMLODIPINE 5 MG TABLET
5.0000 mg | ORAL_TABLET | Freq: Every day | ORAL | 1 refills | Status: DC
Start: 2020-01-08 — End: 2020-03-12

## 2020-01-08 NOTE — Nursing Note (Signed)
01/08/20 1000   Medication Administration   Initials DK   Medication Name Depo Medrol 80   NDC#[address]-01   Medication Dose Given 1mL   NDC # 0703-0051-01   LOT # [id number]   Expiration date 09/11/20   Manufacturer TEV[employer]   Clinic Supplied Yes   Patient Supplied No   Comments: PLEASE SEE PROCEDURE NOTE FOR ADDITIONAL INFORMATION

## 2020-01-17 ENCOUNTER — Ambulatory Visit (HOSPITAL_COMMUNITY)
Admission: RE | Admit: 2020-01-17 | Discharge: 2020-01-17 | Disposition: A | Discharge status: Still In Hospital | Payer: Medicare Other | Source: Ambulatory Visit | Attending: Orthopaedic Surgery | Admitting: Orthopaedic Surgery

## 2020-01-17 ENCOUNTER — Other Ambulatory Visit: Payer: Self-pay

## 2020-01-17 DIAGNOSIS — M25519 Pain in unspecified shoulder: Secondary | ICD-10-CM

## 2020-01-17 DIAGNOSIS — M7502 Adhesive capsulitis of left shoulder: Secondary | ICD-10-CM

## 2020-01-17 DIAGNOSIS — M67911 Unspecified disorder of synovium and tendon, right shoulder: Secondary | ICD-10-CM

## 2020-01-17 NOTE — OT Evaluation (Signed)
[facility]  [place], [place] [address]    PLAN OF TREATMENT FOR OUTPATIENT REHABILITATION  Department of Health And Human Services  Centers for Medicare & [facility] Services    Outpatient Physical/Occupational Therapy Hand Evaluation    Tucker, Steedley,  Date of Birth:  14-Aug-1956  Med Rec #:  [id number]  Referring physician: [name], DO  Medical Diagnosis:  Shoulder pain, unspecified chronicity, unspecified laterality, adhesive capsulitis of left shoulder, dysfunction of R rotator cuff  PT/OT Diagnosis:  Decreased ROM and increased pain of B shoulders  Onset date:  01/2019    PMH:    Past Medical History:   Diagnosis Date   . Anxiety    . 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 daily   . Chronic pain     right foot/right leg   . 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 Depression    . Hypercholesterolemia    . Hyperlipidemia    . Hypertension    . Obesity    . Peripheral edema     right foot   . Plantar fascial fibromatosis of right foot    . PONV (postoperative nausea and vomiting)     requests Anti Emetic   . RLS (restless legs syndrome)    . Snores     Never has had sleep study   . Tarsal tunnel syndrome, right    . Vitamin D deficiency    . Wears glasses      Past Surgical History:   Procedure Laterality Date   . HX APPENDECTOMY  childhood   . HX EYE SURGERY Bilateral Childhood    Muscle Surgery   . HX SHOULDER SURGERY Right 06/2016    Exc. Bone Spur, Repair Rotator Cuff Tear   . HX TUBAL LIGATION  1992       Subjective:      Medical history related to diagnosis:  Pt reports that she began experiencing increased pain and decreased ROM of L shoulder about four months ago. Dr. 07/2016 has diagnosed her with L adhesive capsulitis and pt received a corticosteroid injection, but she reports no change in symptoms.     She reported that she had RTC repair surgery and removal of  bone spur on R shoulder about 6 years ago, and underwent skilled PT following surgery. She reported that she had near full function of R shoulder following therapy. States that she began experiencing increased pain and weakness about one year ago, but it has exacerbated recently. Medical chart review reveals that right rotator cuff is not fully healed.     Pt has also been prescribed a muscle relaxer and 800 mg ibuprofen, but does not like taking it. She says, "I don't like to take a lot of medicine." She reports that it does decrease the pain when she does take it.    Prior level of function:  Independent     Current level of function:  She reports that her limited ROM and increased pain has caused her increased difficulty with several daily activities including making the bed, reaching in cabinets, reaching into refrigerator, dressing, washing/combing hair, and sleeping. She also reports that she can't lift anything over 5lbs.     Patient Goals:  Decrease pain, increase function     Pain  Pain without movement:  L: 8/10 R: 5/10  Pain  with movement:  L: 10/10 R: 7/10   What causes pain:  movement, force  What relieves pain: medication, rest, elevation   Description of pain:  aching, sharp / stabbing    Objective:    Sensory: No complaints     Shoulder ROM   Normal (right) AROM Normal (left) AROM   Shoulder flexion 180 135 180 90   Shoulder abduction  180 100 180 50   Shoulder extension 60 45 60 50   Shoulder ER 90 15 90 45   Shoulder IR 70 L4 70 Near iliac crest     HEP:  AA shoulder flexion in supine with hands clasped  Sleeper stretch  Pectoralis stretch in doorway   AA shoulder flexion on table surface in sitting  AA shoulder abduction on table surface in sitting   Posterior capsule stretch   Exercises to be performed x10 reps 5x/day 7days/week.     Assessment:    It was my pleasure to evaluate the patient, Essie Lagunes, for outpatient occupational therapy. Pt presents to evaluation with symptoms related to  diagnoses of R rotator cuff dysfunction and L shoulder adhesive capsulitis. Pt is demonstrating decreased AROM of B shoulders, decreased strength of B shoulders, and increased pain in B shoulders. These deficits are impacting her performance with sleeping, grooming, bathing, dressing, home maintenance, meal preparation and cleanup, and grocery shopping. She would benefit from skilled OT at least 2x weekly for 8 weeks to address deficits and increase occupational performance. HEP established and reviewed with pt today.     Prognosis:  good    Goals  Short term goals:    Within 4 weeks, (02/14/20)  1. Pt will be compliant with HEP and therapy sessions.  2. For improved performance with dressing, pt will demonstrate increased B active shoulder flexion and abduction by 15 degrees.   3. For improved performance with sleeping, pt will demonstrate decreased pain in L shoulder to 8/10 and R shoulder to 5/10.       Long term goals:    Within 8 weeks, (03/13/20)  1. Pt will be compliant with HEP and therapy sessions.  2. For improved performance with dressing, pt will demonstrate increased B shoulder AROM to be [facility].   3. For improved performance with sleeping, pt will demonstrate decreased pain in L shoulder to 3/10 and R shoulder to 1/10.     POC  Treatment plan:  Ultrasound, Electrical stimulation, Moist heat, Manual therapy, Joint mobilization, Flexibility/ROM exercises, Therapeutic exercises and Therapeutic taping    It is critical that the patient be seen 2 times a week for 8 weeks for skilled care to achieve the above goals.    Rehab potential:  good    Thank you for referring this patient.   BreAnne Herline, OTR/L    **By signing this, I certify that I have read and agree with the therapists recommendations.  This will serve as a new order for the above patient.  Any changes are noted in writing on this paper.        __________________________________________  [id number] Signature/ Date    Evaluation [phone]  (low) [phone]   (moderate) [phone]  (high)   History - medical and therapy BRIEF review of history  EXPANDED review of  history related to current fxnl perf. EXTENSIVE review of history related to current fxnl perf.   Assessment 1-3 performance deficits  3-5 performance deficits 5 or more performance deficits   Clinical decision making complexity Limited treatment options  Several treatment options Multiple treatment options   Co-morbidities None that affect, no modifications needed Some, minimal to moderate modifications needed for evaluation Yes, significant modifications needed for evaluation   Time face to face with patient/family 0 to 30 minutes 30 to 45 minutes 45 to 60 minutes   OT eval level: moderate

## 2020-01-17 NOTE — OT Treatment (Signed)
[place], [place] [address]     Outpatient Rehabilitation Services  Occupational Therapy Progress Note    Patient Name: Toni Tran  Date of Birth: 01-Jan-1956  MRN: [id number]  Payor: [facility] MEDICARE / Plan: [facility] MEDICARE DUAL COMPLETE / Product Type: MEDICARE [facility] /   Diagnosis:  Shoulder pain, unspecified chronicity, unspecified laterality, adhesive capsulitis of left shoulder, dysfunction of R rotator cuff  Physician name:  Dr. Marden Noble  Next Physician Follow-up Visit: tbd    Evaluation date:  01/17/20  Visit #:  1    Subjective   Pt presented to eval.     Objective   OT eval complete. Refer to eval for details.    HEP:  AA shoulder flexion in supine with hands clasped  Sleeper stretch  Pectoralis stretch in doorway   AA shoulder flexion on table surface in sitting  AA shoulder abduction on table surface in sitting   Posterior capsule stretch   Exercises to be performed x10 reps 5x/day 7days/week.                            Assessment:   It was my pleasure to evaluate the patient, Toni Tran, for outpatient occupational therapy. Pt presents to evaluation with symptoms related to diagnoses of R rotator cuff dysfunction and L shoulder adhesive capsulitis. Pt is demonstrating decreased AROM of B shoulders, decreased strength of B shoulders, and increased pain in B shoulders. These deficits are impacting her performance with sleeping, grooming, bathing, dressing, home maintenance, meal preparation and cleanup, and grocery shopping. She would benefit from skilled OT at least 2x weekly for 8 weeks to address deficits and increase occupational performance. HEP established and reviewed with pt today.       Goal:   Short term goals:    Within 4 weeks, (02/14/20)  1. Pt will be compliant with HEP and therapy sessions.  2. For improved performance with dressing, pt will demonstrate increased B active shoulder flexion and abduction by 15 degrees.   3. For improved performance with sleeping, pt will demonstrate decreased pain in L shoulder to  8/10 and R shoulder to 5/10.       Long term goals:    Within 8 weeks, (03/13/20)  1. Pt will be compliant with HEP and therapy sessions.  2. For improved performance with dressing, pt will demonstrate increased B shoulder AROM to be [facility].   3. For improved performance with sleeping, pt will demonstrate decreased pain in L shoulder to 3/10 and R shoulder to 1/10.     Plan:   Continue OT to include moist heat to B shoulders, PROM to tolerance, AAROM exercises to tolerance, ultrasound or e-stim as needed for pain      Treatment plan:  Ultrasound, Electrical stimulation, Moist heat, Manual therapy, Joint mobilization, Flexibility/ROM exercises, Therapeutic exercises and Therapeutic taping    It is critical that the patient be seen 2 times a week for 8 weeks for skilled care to achieve the above goals.      The risks/benefits of therapy have been discussed with the patient and he/she is in agreement with the established plan of care.    Therapist :     Park Pope, OT      Total Treatment Time:  45 minutes    Time may include review of chart notes, obtaining patient's functional history from patient/family/medical staff/case management/ancillary personnel, collaboration on findings and treatment options (with the above mentioned individuals), re-assessment,  and acute care rehabilitation.

## 2020-01-21 ENCOUNTER — Other Ambulatory Visit: Payer: Self-pay

## 2020-01-21 ENCOUNTER — Ambulatory Visit (HOSPITAL_COMMUNITY)
Admission: RE | Admit: 2020-01-21 | Discharge: 2020-01-21 | Disposition: A | Discharge status: Still In Hospital | Payer: Medicare Other | Source: Ambulatory Visit | Attending: Orthopaedic Surgery | Admitting: Orthopaedic Surgery

## 2020-01-21 NOTE — OT Treatment (Signed)
[place], [place] [address]     Outpatient Rehabilitation Services  Occupational Therapy Progress Note    Patient Name: Toni Tran  Date of Birth: 07/11/1956  MRN: [id number]  Payor: [facility] MEDICARE / Plan: [facility] MEDICARE DUAL COMPLETE / Product Type: MEDICARE [facility] /   Diagnosis:  Shoulder pain, unspecified chronicity, unspecified laterality, adhesive capsulitis of left shoulder, dysfunction of R rotator cuff  Physician name:  Dr. Marden Noble  Next Physician Follow-up Visit: tbd    Evaluation date:  01/17/20  Visit #:  2    Subjective   Pt stated that she has been completing her HEP twice a day. She reported that she has 3/10 pain in her B shoulders this morning.     Objective   Moist heat applied to B shoulders x7 minutes: no charge    Therapeutic exercises: 6 minutes: no charge   Pulleys in shoulder flexion and abduction for B shoulders to tolerance  AAROM flexion with hands clasped x10     Manual therapy: 15 minutes: 1 unit  PROM to B shoulder in flexion, abduction, ER, IR to tolerance    Ultrasound: 8 minutes: 1 unit  Applied to L shoulder  1 MHz  1.0 w/cm2  Continuous     HEP:  AA shoulder flexion in supine with hands clasped  Sleeper stretch  Pectoralis stretch in doorway   AA shoulder flexion on table surface in sitting  AA shoulder abduction on table surface in sitting   Posterior capsule stretch   Exercises to be performed x10 reps 5x/day 7days/week.                            Assessment:   Pt tolerated session well. Tolerated greater excursion with PROM to R shoulder as compared to L. Pt would benefit from continued therapy to address decreased AROM of B shoulders, decreased strength of B shoulders, and increased pain in B shoulders to improve performance with sleeping, grooming, bathing, dressing, home maintenance, meal preparation and cleanup, and grocery shopping.    Goal:   Short term goals:    Within 4 weeks, (02/14/20)  1. Pt will be compliant with HEP and therapy sessions.  2. For improved performance with dressing, pt  will demonstrate increased B active shoulder flexion and abduction by 15 degrees.   3. For improved performance with sleeping, pt will demonstrate decreased pain in L shoulder to 8/10 and R shoulder to 5/10.     Long term goals:    Within 8 weeks, (03/13/20)  1. Pt will be compliant with HEP and therapy sessions.  2. For improved performance with dressing, pt will demonstrate increased B shoulder AROM to be [facility].   3. For improved performance with sleeping, pt will demonstrate decreased pain in L shoulder to 3/10 and R shoulder to 1/10.     Plan:   Continue OT to include moist heat to B shoulders, PROM to tolerance, AAROM exercises to tolerance, ultrasound or e-stim as needed for pain      Treatment plan:  Ultrasound, Electrical stimulation, Moist heat, Manual therapy, Joint mobilization, Flexibility/ROM exercises, Therapeutic exercises and Therapeutic taping    It is critical that the patient be seen 2 times a week for 8 weeks for skilled care to achieve the above goals.      The risks/benefits of therapy have been discussed with the patient and he/she is in agreement with the established plan of care.    Therapist :  Cayton Cuevas, OT      Total Treatment Time:  36 minutes    Time may include review of chart notes, obtaining patient's functional history from patient/family/medical staff/case management/ancillary personnel, collaboration on findings and treatment options (with the above mentioned individuals), re-assessment, and acute care rehabilitation.

## 2020-01-23 ENCOUNTER — Other Ambulatory Visit: Payer: Self-pay

## 2020-01-23 ENCOUNTER — Ambulatory Visit (HOSPITAL_COMMUNITY)
Admission: RE | Admit: 2020-01-23 | Discharge: 2020-01-23 | Disposition: A | Discharge status: Still In Hospital | Payer: Medicare Other | Source: Ambulatory Visit | Attending: Orthopaedic Surgery | Admitting: Orthopaedic Surgery

## 2020-01-23 NOTE — OT Treatment (Signed)
[place], [place] [address]     Outpatient Rehabilitation Services  Occupational Therapy Progress Note    Patient Name: Toni Tran  Date of Birth: 1956/10/09  MRN: [id number]  Payor: [facility] MEDICARE / Plan: [facility] MEDICARE DUAL COMPLETE / Product Type: MEDICARE [facility] /   Diagnosis:  Shoulder pain, unspecified chronicity, unspecified laterality, adhesive capsulitis of left shoulder, dysfunction of R rotator cuff  Physician name:  Dr. Lynelle Smoke  Next Physician Follow-up Visit: tbd    Evaluation date:  01/17/20  Visit #:  3    Subjective   Pt stated that she has been completing her HEP twice a day. She reported that she had some soreness after our last session, but is feeling better today.     Objective   Moist heat applied to B shoulders x7 minutes: no charge    Therapeutic exercises: 15 minutes: 1 unit  Pulleys in shoulder flexion and abduction for B shoulders to tolerance  AAROM flexion and horizontal abduction/adduction with hands clasped x10 each    Manual therapy: 15 minutes: 1 unit  PROM to B shoulder in flexion, abduction, ER, IR to tolerance    Ultrasound: 8 minutes: 1 unit  Applied to L shoulder  1 MHz  1.0 w/cm2  Continuous     HEP:  AA shoulder flexion in supine with hands clasped  Sleeper stretch  Pectoralis stretch in doorway   AA shoulder flexion on table surface in sitting  AA shoulder abduction on table surface in sitting   Posterior capsule stretch   Exercises to be performed x10 reps 5x/day 7days/week.                            Assessment:   Pt tolerated session well. Tolerated greater excursion with PROM of R shoulder compared to last session. Her L shoulder is still quite stiff beyond 90 degrees of flexion. Pt would benefit from continued therapy to address decreased AROM of B shoulders, decreased strength of B shoulders, and increased pain in B shoulders to improve performance with sleeping, grooming, bathing, dressing, home maintenance, meal preparation and cleanup, and grocery shopping.    Goal:   Short term goals:     Within 4 weeks, (02/14/20)  1. Pt will be compliant with HEP and therapy sessions.  2. For improved performance with dressing, pt will demonstrate increased B active shoulder flexion and abduction by 15 degrees.   3. For improved performance with sleeping, pt will demonstrate decreased pain in L shoulder to 8/10 and R shoulder to 5/10.     Long term goals:    Within 8 weeks, (03/13/20)  1. Pt will be compliant with HEP and therapy sessions.  2. For improved performance with dressing, pt will demonstrate increased B shoulder AROM to be [facility].   3. For improved performance with sleeping, pt will demonstrate decreased pain in L shoulder to 3/10 and R shoulder to 1/10.     Plan:   Continue OT to include moist heat to B shoulders, PROM to tolerance, AAROM exercises to tolerance, ultrasound or e-stim as needed for pain      Treatment plan:  Ultrasound, Electrical stimulation, Moist heat, Manual therapy, Joint mobilization, Flexibility/ROM exercises, Therapeutic exercises and Therapeutic taping    It is critical that the patient be seen 2 times a week for 8 weeks for skilled care to achieve the above goals.      The risks/benefits of therapy have been discussed with the  patient and he/she is in agreement with the established plan of care.    Therapist :     Park Pope, OT      Total Treatment Time:  45 minutes    Time may include review of chart notes, obtaining patient's functional history from patient/family/medical staff/case management/ancillary personnel, collaboration on findings and treatment options (with the above mentioned individuals), re-assessment, and acute care rehabilitation.

## 2020-01-24 ENCOUNTER — Ambulatory Visit (HOSPITAL_COMMUNITY): Payer: Self-pay

## 2020-01-28 ENCOUNTER — Other Ambulatory Visit: Payer: Self-pay

## 2020-01-28 ENCOUNTER — Ambulatory Visit (HOSPITAL_COMMUNITY)
Admission: RE | Admit: 2020-01-28 | Discharge: 2020-01-28 | Disposition: A | Discharge status: Still In Hospital | Payer: Medicare Other | Source: Ambulatory Visit | Attending: Orthopaedic Surgery | Admitting: Orthopaedic Surgery

## 2020-01-28 NOTE — OT Treatment (Signed)
[place], [place] [address]     Outpatient Rehabilitation Services  Occupational Therapy Progress Note    Patient Name: Lane Kjos  Date of Birth: May 14, 1956  MRN: [id number]  Payor: [facility] MEDICARE / Plan: [facility] MEDICARE DUAL COMPLETE / Product Type: MEDICARE [facility] /   Diagnosis:  Shoulder pain, unspecified chronicity, unspecified laterality, adhesive capsulitis of left shoulder, dysfunction of R rotator cuff  Physician name:  Dr. Marden Noble  Next Physician Follow-up Visit: tbd    Evaluation date:  01/17/20  Visit #:  4    Subjective   Pt stated that she has been completing her HEP twice a day. She reported that she is noticing improvements in her B shoulder ROM with R > L.     Objective   Moist heat applied to B shoulders x7 minutes: no charge    Therapeutic exercises: 15 minutes: 1 unit  Pulleys in shoulder flexion and abduction for B shoulders to tolerance  AAROM for B shoulder flexion and horizontal abduction/adduction with towel on wall  x10 each  Active shoulder flexion x10 each for L and R shoulders    Manual therapy: 15 minutes: 1 unit  PROM to B shoulder in flexion, abduction, ER, IR to tolerance    Ultrasound: 8 minutes: 1 unit  Applied to L shoulder  1 MHz  1.0 w/cm2  Continuous     HEP:  AA shoulder flexion in supine with hands clasped  Sleeper stretch  Pectoralis stretch in doorway   AA shoulder flexion on table surface in sitting  AA shoulder abduction on table surface in sitting   Posterior capsule stretch   Exercises to be performed x10 reps 5x/day 7days/week.                            Assessment:   Pt tolerated session well. Tolerated greater excursion with PROM of R shoulder compared to last session. Her L shoulder is still quite stiff, but she was able to perform about 100 degrees of flexion. Pt would benefit from continued therapy to address decreased AROM of B shoulders, decreased strength of B shoulders, and increased pain in B shoulders to improve performance with sleeping, grooming, bathing, dressing, home  maintenance, meal preparation and cleanup, and grocery shopping.    Goal:   Short term goals:    Within 4 weeks, (02/14/20)  1. Pt will be compliant with HEP and therapy sessions.  2. For improved performance with dressing, pt will demonstrate increased B active shoulder flexion and abduction by 15 degrees.   3. For improved performance with sleeping, pt will demonstrate decreased pain in L shoulder to 8/10 and R shoulder to 5/10.     Long term goals:    Within 8 weeks, (03/13/20)  1. Pt will be compliant with HEP and therapy sessions.  2. For improved performance with dressing, pt will demonstrate increased B shoulder AROM to be [facility].   3. For improved performance with sleeping, pt will demonstrate decreased pain in L shoulder to 3/10 and R shoulder to 1/10.     Plan:   Continue OT to include moist heat to B shoulders, PROM to tolerance, AAROM exercises to tolerance, ultrasound or e-stim as needed for pain      Treatment plan:  Ultrasound, Electrical stimulation, Moist heat, Manual therapy, Joint mobilization, Flexibility/ROM exercises, Therapeutic exercises and Therapeutic taping    It is critical that the patient be seen 2 times a week for 8 weeks  for skilled care to achieve the above goals.      The risks/benefits of therapy have been discussed with the patient and he/she is in agreement with the established plan of care.    Therapist :     Rosana Berger, OT      Total Treatment Time:  45 minutes    Time may include review of chart notes, obtaining patient's functional history from patient/family/medical staff/case management/ancillary personnel, collaboration on findings and treatment options (with the above mentioned individuals), re-assessment, and acute care rehabilitation.

## 2020-01-31 ENCOUNTER — Ambulatory Visit (HOSPITAL_COMMUNITY): Payer: Self-pay

## 2020-02-04 ENCOUNTER — Telehealth (INDEPENDENT_AMBULATORY_CARE_PROVIDER_SITE_OTHER): Payer: Medicare Other | Admitting: Family Medicine

## 2020-02-04 ENCOUNTER — Other Ambulatory Visit: Payer: Self-pay

## 2020-02-04 ENCOUNTER — Ambulatory Visit (HOSPITAL_COMMUNITY): Payer: Self-pay

## 2020-02-04 DIAGNOSIS — E785 Hyperlipidemia, unspecified: Secondary | ICD-10-CM

## 2020-02-04 DIAGNOSIS — E559 Vitamin D deficiency, unspecified: Secondary | ICD-10-CM

## 2020-02-04 DIAGNOSIS — I10 Essential (primary) hypertension: Secondary | ICD-10-CM

## 2020-02-04 NOTE — Progress Notes (Signed)
[facility]  [place]  [place] [address]  Phone: [phone]  Fax: [phone]    Encounter Date: 02/04/2020    Patient ID:  Toni Tran  VQQ:[id number]    DOB: 10-05-1956  Age: 64 y.o. female    Subjective:   No chief complaint on file.    HPI   Virtual  64y/o obese female w/ h/o HTN, HLD, Vitamin d deficiency for follow up appt. Mentions that  BP is high in 190/100. BP medication is not working. Taking 2 BP medications and says she is compliant w/ medication. No chest pain or sob or tremors. Says when BP gets elevated face gets red.   Current Outpatient Medications   Medication Sig   . albuterol sulfate (PROAIR HFA) 90 mcg/actuation Inhalation HFA Aerosol Inhaler Take 1-2 Puffs by inhalation Every 6 hours as needed W/ spacer   . amLODIPine (NORVASC) 5 mg Oral Tablet Take 1 Tab (5 mg total) by mouth Once a day AMLODIPINE BESYLATE   . aspirin 325 mg Oral Tablet Take 325 mg by mouth Once a day   . Ibuprofen (MOTRIN) 800 mg Oral Tablet Take 1 Tab (800 mg total) by mouth Twice per day as needed for Pain   . lisinopriL-hydrochlorothiazide (ZESTORETIC) 20-12.5 mg Oral Tablet Take 1 Tab by mouth Twice daily   . sertraline (ZOLOFT) 50 mg Oral Tablet Take 1 Tab (50 mg total) by mouth Once a day (Patient not taking: Reported on 01/07/2020)   . simvastatin (ZOCOR) 20 mg Oral Tablet Take 1 Tab (20 mg total) by mouth Every evening   . tiZANidine (ZANAFLEX) 4 mg Oral Tablet Take 1 Tab (4 mg total) by mouth Twice per day as needed for up to 30 days   . traZODone (DESYREL) 150 mg Oral Tablet TAKE 1 TABLET(150 MG) BY MOUTH EVERY NIGHT   . VENTOLIN HFA 90 mcg/actuation Inhalation HFA Aerosol Inhaler Take 2 Puffs by inhalation Every 6 hours as needed     No Known Allergies  Past Medical History:   Diagnosis Date   . Anxiety    . 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 daily   . Chronic pain     right foot/right leg   . 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 Depression    . Hypercholesterolemia    . Hyperlipidemia    . Hypertension    . Obesity    . Peripheral edema     right foot   . Plantar fascial fibromatosis of right foot    . PONV (postoperative nausea and vomiting)     requests Anti Emetic   . RLS (restless legs syndrome)    . Snores     Never has had sleep study   . Tarsal tunnel syndrome, right    . Vitamin D deficiency    . Wears glasses          Past Surgical History:   Procedure Laterality Date   . HX APPENDECTOMY  childhood   . HX EYE SURGERY Bilateral Childhood    Muscle Surgery   . HX SHOULDER SURGERY Right 06/2016    Exc. Bone Spur, Repair Rotator Cuff Tear   . HX TUBAL LIGATION  1992         Family Medical History:     Problem Relation (Age of  Onset)    Alzheimer's/Dementia Mother, Father    Arthritis-osteo Mother, Father    Blood Clots Father    Congestive Heart Failure Father    Coronary Artery Disease Father    Diabetes Father    Heart Attack Mother, Father    High Cholesterol Mother, Father    Hypertension (High Blood Pressure) Mother, Father    Migraines Mother, Father    Sleep disorders Father    Stroke Mother, Father            Social History     Tobacco Use   . Smoking status: Never Smoker   . Smokeless tobacco: Never Used   Substance Use Topics   . Alcohol use: No   . Drug use: No       Review of Systems   Constitutional: Negative.    HENT: Negative.    Eyes: Negative.    Respiratory: Negative.    Cardiovascular: Negative.    Gastrointestinal: Negative.    Musculoskeletal: Negative.    Skin: Negative.    Neurological: Negative.    Psychiatric/Behavioral: Negative.      Objective:   Vitals: There were no vitals taken for this visit.        Physical Exam  Constitutional:       Appearance: Normal appearance.      Comments: Fatigue appearing   HENT:      Head: Normocephalic and atraumatic.   Neurological:      General: No focal deficit present.      Mental  Status: She is alert and oriented to person, place, and time.   Psychiatric:         Behavior: Behavior normal.         Thought Content: Thought content normal.         Judgment: Judgment normal.         Assessment & Plan:     ENCOUNTER DIAGNOSES     ICD-10-CM   1. Hypertension, unspecified type  I10   2. Hyperlipidemia, unspecified hyperlipidemia type  E78.5   3. Vitamin D deficiency  E55.9     Come in tomorrow morning  For BP check  Low carb diet, incr exercise and sleep    No orders of the defined types were placed in this encounter.      No follow-ups on file.    [name], MD

## 2020-02-07 ENCOUNTER — Other Ambulatory Visit: Payer: Self-pay

## 2020-02-07 ENCOUNTER — Ambulatory Visit (HOSPITAL_COMMUNITY)
Admission: RE | Admit: 2020-02-07 | Discharge: 2020-02-07 | Disposition: A | Discharge status: Still In Hospital | Payer: Medicare Other | Source: Ambulatory Visit | Attending: Orthopaedic Surgery | Admitting: Orthopaedic Surgery

## 2020-02-07 NOTE — OT Treatment (Signed)
[place], [place] [address]     Outpatient Rehabilitation Services  Occupational Therapy Progress Note    Patient Name: Toni Tran  Date of Birth: 02-24-1956  MRN: [id number]  Payor: [facility] MEDICARE / Plan: [facility] MEDICARE DUAL COMPLETE / Product Type: MEDICARE [facility] /   Diagnosis:  Shoulder pain, unspecified chronicity, unspecified laterality, adhesive capsulitis of left shoulder, dysfunction of R rotator cuff  Physician name:  Dr. Marden Noble  Next Physician Follow-up Visit: tbd    Evaluation date:  01/17/20  Visit #:  5    Subjective   Pt stated that she has been completing her HEP twice a day. Pt has not been seen since 01/23/20 due to weather and illness related cancellations. She reported that her R arm is doing well and she no longer has pain in it. She reported that her L shoulder is still quite stiff and painful.     Objective   Moist heat applied to B shoulders x7 minutes: no charge    Therapeutic exercises: 15 minutes: 1 unit  Pulleys in shoulder flexion and abduction for B shoulders to tolerance  AAROM for B shoulder flexion and horizontal abduction/adduction with towel on wall  x10 each  Active shoulder flexion and abduction x10 each for L and R shoulders    Manual therapy: 15 minutes: 1 unit  PROM to B shoulder in flexion, abduction, ER, IR to tolerance    Ultrasound: 8 minutes: 1 unit  Applied to L shoulder  1 MHz  1.0 w/cm2  Continuous     HEP:  AA shoulder flexion in supine with hands clasped  Sleeper stretch  Pectoralis stretch in doorway   AA shoulder flexion on table surface in sitting  AA shoulder abduction on table surface in sitting   Posterior capsule stretch   Exercises to be performed x10 reps 5x/day 7days/week.                            Assessment:   Pt tolerated session well. Tolerated greater excursion with Passive and Active ROM of R shoulder compared to last session. Her L shoulder is still quite stiff, but she was able to perform about 110 degrees of flexion and 130 degrees of abduction. Pt would benefit  from continued therapy to address decreased AROM of B shoulders, decreased strength of B shoulders, and increased pain in B shoulders to improve performance with sleeping, grooming, bathing, dressing, home maintenance, meal preparation and cleanup, and grocery shopping.    Goal:   Short term goals:    Within 4 weeks, (02/14/20)  1. Pt will be compliant with HEP and therapy sessions.  2. For improved performance with dressing, pt will demonstrate increased B active shoulder flexion and abduction by 15 degrees.   3. For improved performance with sleeping, pt will demonstrate decreased pain in L shoulder to 8/10 and R shoulder to 5/10.     Long term goals:    Within 8 weeks, (03/13/20)  1. Pt will be compliant with HEP and therapy sessions.  2. For improved performance with dressing, pt will demonstrate increased B shoulder AROM to be [facility].   3. For improved performance with sleeping, pt will demonstrate decreased pain in L shoulder to 3/10 and R shoulder to 1/10.     Plan:   Continue OT to include moist heat to B shoulders, PROM to tolerance, AAROM exercises to tolerance, ultrasound or e-stim as needed for pain  Treatment plan:  Ultrasound, Electrical stimulation, Moist heat, Manual therapy, Joint mobilization, Flexibility/ROM exercises, Therapeutic exercises and Therapeutic taping    It is critical that the patient be seen 2 times a week for 8 weeks for skilled care to achieve the above goals.      The risks/benefits of therapy have been discussed with the patient and he/she is in agreement with the established plan of care.    Therapist :     Rosana Berger, OT      Total Treatment Time:  45 minutes    Time may include review of chart notes, obtaining patient's functional history from patient/family/medical staff/case management/ancillary personnel, collaboration on findings and treatment options (with the above mentioned individuals), re-assessment, and acute care rehabilitation.

## 2020-02-11 ENCOUNTER — Ambulatory Visit (HOSPITAL_COMMUNITY)
Admission: RE | Admit: 2020-02-11 | Discharge: 2020-02-11 | Disposition: A | Discharge status: Still In Hospital | Payer: Medicare Other | Source: Ambulatory Visit | Attending: Orthopaedic Surgery | Admitting: Orthopaedic Surgery

## 2020-02-11 ENCOUNTER — Other Ambulatory Visit: Payer: Self-pay

## 2020-02-11 NOTE — OT Treatment (Signed)
[place], [place] [address]     Outpatient Rehabilitation Services  Occupational Therapy Progress Note    Patient Name: Dallis Darden  Date of Birth: 12/12/1956  MRN: [id number]  Payor: [facility] MEDICARE / Plan: [facility] MEDICARE DUAL COMPLETE / Product Type: MEDICARE [facility] /   Diagnosis:  Shoulder pain, unspecified chronicity, unspecified laterality, adhesive capsulitis of left shoulder, dysfunction of R rotator cuff  Physician name:  Dr. Lynelle Smoke  Next Physician Follow-up Visit: tbd    Evaluation date:  01/17/20  Visit #:  6    Subjective   Pt stated that her blood pressure was 160/100 this morning. Took pt's blood pressure immediately after she said this and it was 130/80. This is considered normal. She stated that both of her shoulders are getting better though she still has some pain at end range. She also reported that she has been having L lower leg pain since Friday.     Objective       Therapeutic exercises: 15 minutes: 1 unit  Pulleys in shoulder flexion and abduction for B shoulders to tolerance  Active shoulder flexion and abduction x10 each for L and R shoulders  UBE x3 minutes   Isometrics in flexion, extension, abduction, adduction for B shoulder x5 each     Manual therapy: 20 minutes: 1 unit  PROM to B shoulder in flexion, abduction, ER, IR to tolerance    Ultrasound: 8 minutes: 1 unit  Applied to L shoulder  1 MHz  1.0 w/cm2  Continuous     HEP:  AA shoulder flexion in supine with hands clasped  Sleeper stretch  Pectoralis stretch in doorway   AA shoulder flexion on table surface in sitting  AA shoulder abduction on table surface in sitting   Posterior capsule stretch   Exercises to be performed x10 reps 5x/day 7days/week.                            Assessment:   Pt tolerated session well. AROM of R and L shoulders is near the normal range though she is still experiencing some pain at end range. She tolerated initiation of strengthening well today. Recommended contacting PCP regarding issues with blood pressure and leg pain.  Blood pressure was normal during the session today. Pt would benefit from continued therapy to address decreased AROM of B shoulders, decreased strength of B shoulders, and increased pain in B shoulders to improve performance with sleeping, grooming, bathing, dressing, home maintenance, meal preparation and cleanup, and grocery shopping.    Goal:   Short term goals:    Within 4 weeks, (02/14/20)  1. Pt will be compliant with HEP and therapy sessions.  2. For improved performance with dressing, pt will demonstrate increased B active shoulder flexion and abduction by 15 degrees.   3. For improved performance with sleeping, pt will demonstrate decreased pain in L shoulder to 8/10 and R shoulder to 5/10.     Long term goals:    Within 8 weeks, (03/13/20)  1. Pt will be compliant with HEP and therapy sessions.  2. For improved performance with dressing, pt will demonstrate increased B shoulder AROM to be [facility].   3. For improved performance with sleeping, pt will demonstrate decreased pain in L shoulder to 3/10 and R shoulder to 1/10.     Plan:   Continue OT to include moist heat to B shoulders, PROM to tolerance, AAROM exercises to tolerance, ultrasound or e-stim as needed  for pain      Treatment plan:  Ultrasound, Electrical stimulation, Moist heat, Manual therapy, Joint mobilization, Flexibility/ROM exercises, Therapeutic exercises and Therapeutic taping    It is critical that the patient be seen 2 times a week for 8 weeks for skilled care to achieve the above goals.      The risks/benefits of therapy have been discussed with the patient and he/she is in agreement with the established plan of care.    Therapist :     Park Pope, OT      Total Treatment Time:  43 minutes    Time may include review of chart notes, obtaining patient's functional history from patient/family/medical staff/case management/ancillary personnel, collaboration on findings and treatment options (with the above mentioned individuals),  re-assessment, and acute care rehabilitation.

## 2020-02-14 ENCOUNTER — Ambulatory Visit
Admission: RE | Admit: 2020-02-14 | Discharge: 2020-02-14 | Disposition: A | Discharge status: Still In Hospital | Payer: Medicare Other | Source: Ambulatory Visit | Attending: Orthopaedic Surgery | Admitting: Orthopaedic Surgery

## 2020-02-14 ENCOUNTER — Other Ambulatory Visit: Payer: Self-pay

## 2020-02-14 NOTE — OT Treatment (Signed)
[place], [place] [address]     Outpatient Rehabilitation Services  Occupational Therapy Progress Note    Patient Name: [place]  Date of Birth: 07-06-1956  MRN: [id number]  Payor: [facility] MEDICARE / Plan: [facility] MEDICARE DUAL COMPLETE / Product Type: MEDICARE [facility] /   Diagnosis:  Shoulder pain, unspecified chronicity, unspecified laterality, adhesive capsulitis of left shoulder, dysfunction of R rotator cuff  Physician name:  Dr. Lynelle Smoke  Next Physician Follow-up Visit: tbd    Evaluation date:  01/17/20  Visit #:  6    Subjective   Pt had no c/o pain in her R shoulder. Pt had 4/10 pain in L shoulder. Pt stated that she is experiencing leg pain which started on Friday and hasn't gone away. She stated that she hasn't been getting up and moving around as much because of this and has not been doing her shoulder exercises as much because of the leg pain either. Pt denied suggestion to contact her PCP to get an appt or seek appt at Urgent Care.     Objective   Shoulder ROM   Normal (right) AROM Normal (left) AROM   Shoulder flexion 180 Eval:  135  Today:  140 180 Eval:  90  Today:  115   Shoulder abduction  180 Eval:  100  Today:  150 180 Eval:  50  Today:  130   Shoulder extension 60 Eval:  45  Today:  50 60 Eval:  50  Today:  53   Shoulder ER 90 Eval:  15  Today:  45 90 Eval:  45  Today: 47   Shoulder IR 70 Eval:  L4  Today:   L1 70 Eval:  Near iliac crest  Today:  L4         Therapeutic exercises: 15 minutes: 1 unit  Pulleys in shoulder flexion and abduction for B shoulders to tolerance  Active shoulder flexion and abduction x10 each for L and R shoulders  UBE x3 minutes   Isometrics in flexion, extension, abduction, adduction for B shoulder x5 each     Manual therapy: 20 minutes: 1 unit  PROM to B shoulder in flexion, abduction, ER, IR to tolerance    Ultrasound: 8 minutes: 1 unit  Applied to L shoulder  1 MHz  1.0 w/cm2  Continuous     HEP:  AA shoulder flexion in supine with hands clasped  Sleeper stretch  Pectoralis stretch in  doorway   AA shoulder flexion on table surface in sitting  AA shoulder abduction on table surface in sitting   Posterior capsule stretch   Exercises to be performed x10 reps 5x/day 7days/week.                            Assessment:   Pt tolerated session well though seemed to have more stiffness in L shoulder today compared to R shoulder. This is likely due to decreased activity the past few days. Per measurements, she is demonstrating increased AROM of B shoulders in all directions and decreased pain. At this time, plan for skilled OT for one additional week to increase ROM and focus more on strength.     Goal:   Short term goals:    Within 4 weeks, (02/14/20)  1. Pt will be compliant with HEP and therapy sessions.  2. For improved performance with dressing, pt will demonstrate increased B active shoulder flexion and abduction by 15 degrees. Met for all except R  flexion which increased by 5 degrees.  3. For improved performance with sleeping, pt will demonstrate decreased pain in L shoulder to 8/10 and R shoulder to 5/10. Met.     Long term goals:    Within 8 weeks, (03/13/20)  1. Pt will be compliant with HEP and therapy sessions.  2. For improved performance with dressing, pt will demonstrate increased B shoulder AROM to be [facility].   3. For improved performance with sleeping, pt will demonstrate decreased pain in L shoulder to 3/10 and R shoulder to 1/10.     Plan:   Continue OT to include moist heat to B shoulders, PROM to tolerance, AAROM exercises to tolerance, ultrasound or e-stim as needed for pain      Treatment plan:  Ultrasound, Electrical stimulation, Moist heat, Manual therapy, Joint mobilization, Flexibility/ROM exercises, Therapeutic exercises and Therapeutic taping    It is critical that the patient be seen 2 times a week for 8 weeks for skilled care to achieve the above goals.      The risks/benefits of therapy have been discussed with the patient and he/she is in agreement with the established plan of  care.    Therapist :     Rosana Berger, OT      Total Treatment Time:  43 minutes    Time may include review of chart notes, obtaining patient's functional history from patient/family/medical staff/case management/ancillary personnel, collaboration on findings and treatment options (with the above mentioned individuals), re-assessment, and acute care rehabilitation.

## 2020-02-18 ENCOUNTER — Ambulatory Visit
Admission: RE | Admit: 2020-02-18 | Discharge: 2020-02-18 | Disposition: A | Discharge status: Still In Hospital | Payer: Medicare Other | Source: Ambulatory Visit | Attending: Orthopaedic Surgery | Admitting: Orthopaedic Surgery

## 2020-02-18 ENCOUNTER — Other Ambulatory Visit: Payer: Self-pay

## 2020-02-18 ENCOUNTER — Encounter (INDEPENDENT_AMBULATORY_CARE_PROVIDER_SITE_OTHER): Payer: Medicare Other

## 2020-02-18 ENCOUNTER — Encounter (HOSPITAL_BASED_OUTPATIENT_CLINIC_OR_DEPARTMENT_OTHER): Payer: Self-pay | Admitting: Orthopaedic Surgery

## 2020-02-18 ENCOUNTER — Ambulatory Visit (INDEPENDENT_AMBULATORY_CARE_PROVIDER_SITE_OTHER): Payer: Medicare Other | Admitting: Orthopaedic Surgery

## 2020-02-18 VITALS — BP 110/80 | HR 79 | Temp 96.8°F | Ht 62.0 in | Wt 220.0 lb

## 2020-02-18 DIAGNOSIS — M67911 Unspecified disorder of synovium and tendon, right shoulder: Secondary | ICD-10-CM | POA: Insufficient documentation

## 2020-02-18 DIAGNOSIS — M1712 Unilateral primary osteoarthritis, left knee: Secondary | ICD-10-CM

## 2020-02-18 DIAGNOSIS — M2392 Unspecified internal derangement of left knee: Secondary | ICD-10-CM

## 2020-02-18 DIAGNOSIS — M7502 Adhesive capsulitis of left shoulder: Secondary | ICD-10-CM | POA: Insufficient documentation

## 2020-02-18 DIAGNOSIS — M25519 Pain in unspecified shoulder: Secondary | ICD-10-CM | POA: Insufficient documentation

## 2020-02-18 NOTE — Procedures (Signed)
Her understanding and permission, aseptic technique was utilized to inject the left knee joint with Depo-Medrol 80 mg and 3 mL lidocaine.  Flexed anterior medial approach

## 2020-02-18 NOTE — Nursing Note (Signed)
02/18/20 1100   Medication Administration   Initials DK   Medication Name Depo Medrol 80   NDC#[address]-01   Medication Dose Given 80mL   NDC # 1779-3903-00   LOT # [id number]   Expiration date 11/11/20   Manufacturer TEV[employer]   Clinic Supplied Yes   Patient Supplied No   Comments: PLEASE SEE PROCEDURE NOTE

## 2020-02-18 NOTE — OT Treatment (Signed)
[place], [place] [address]     Outpatient Rehabilitation Services  Occupational Therapy Progress Note    Patient Name: Toni Tran  Date of Birth: 1956-02-06  MRN: [id number]  Payor: [facility] MEDICARE / Plan: [facility] MEDICARE DUAL COMPLETE / Product Type: MEDICARE [facility] /   Diagnosis:  Shoulder pain, unspecified chronicity, unspecified laterality, adhesive capsulitis of left shoulder, dysfunction of R rotator cuff  Physician name:  Dr. Lynelle Smoke  Next Physician Follow-up Visit: tbd    Evaluation date:  01/17/20  Visit #:  7    Subjective   Pt had no c/o pain in her B shoulders. She stated, "My shoulders are doing fantastic."     Objective   Shoulder ROM   Normal (right) AROM Normal (left) AROM   Shoulder flexion 180 Eval:  135  Today:  140 180 Eval:  90  Today:  130   Shoulder abduction  180 Eval:  100  Today:  150 180 Eval:  50  Today:  130   Shoulder extension 60 Eval:  45  Today:  50 60 Eval:  50  Today:  53   Shoulder ER 90 Eval:  15  Today:  45 90 Eval:  45  Today: 47   Shoulder IR 70 Eval:  L4  Today:   L1 70 Eval:  Near iliac crest  Today:  L4         Therapeutic exercises: 15 minutes: 1 unit  Pulleys in shoulder flexion and abduction for B shoulders to tolerance  Active shoulder flexion and abduction x10 each for L and R shoulders  UBE x3 minutes   Strengthening exercises with yellow theraband in shoulder flexion, extension, abduction, adduction for B shoulders x10 each     Manual therapy: 20 minutes: 1 unit  PROM to B shoulder in flexion, abduction, ER, IR to tolerance    Ultrasound: 8 minutes: 1 unit  Applied to L shoulder  1 MHz  1.0 w/cm2  Continuous     HEP:  AA shoulder flexion in supine with hands clasped  Sleeper stretch  Pectoralis stretch in doorway   AA shoulder flexion on table surface in sitting  AA shoulder abduction on table surface in sitting   Posterior capsule stretch   Exercises to be performed x10 reps 5x/day 7days/week.                            Assessment:   Pt tolerated session well and per report is doing  well at home as well. She had no c/o pain in her shoulders and is demonstrating AROM of B shoulders to be functional. At this time, feel that discharge with continuation of HEP is appropriate. Pt in agreement.     Goal:   Short term goals:    Within 4 weeks, (02/14/20)  1. Pt will be compliant with HEP and therapy sessions. Met.   2. For improved performance with dressing, pt will demonstrate increased B active shoulder flexion and abduction by 15 degrees. Met for all except R flexion which increased by 5 degrees.  3. For improved performance with sleeping, pt will demonstrate decreased pain in L shoulder to 8/10 and R shoulder to 5/10. Met.     Long term goals:    Within 8 weeks, (03/13/20)  1. Pt will be compliant with HEP and therapy sessions.Met.   2. For improved performance with dressing, pt will demonstrate increased B shoulder AROM to be [facility]. Met.  3. For improved performance with sleeping, pt will demonstrate decreased pain in L shoulder to 3/10 and R shoulder to 1/10. Met.     Plan:   Plan to discharge from OT      The risks/benefits of therapy have been discussed with the patient and he/she is in agreement with the established plan of care.    Therapist :     Rosana Berger, OT      Total Treatment Time:  43 minutes    Time may include review of chart notes, obtaining patient's functional history from patient/family/medical staff/case management/ancillary personnel, collaboration on findings and treatment options (with the above mentioned individuals), re-assessment, and acute care rehabilitation.

## 2020-02-18 NOTE — Progress Notes (Signed)
[facility] [place] [facility]  [address]  [place] [address]  Dept: [phone]  Dept Fax: [phone]  Loc: [phone]  Loc Fax: [phone]    CC:    Chief Complaint   Patient presents with   . Follow-up     F/U Left shoulder       HPI:   Toni Tran is a 64 y.o. female who presents to the clinic for her left knee.    She has graduated from her shoulder therapy and she is doing well.  She is satisfied.    Her big issue is her left knee.  This been problematic for 2 weeks.  She cannot walk.  She cannot go up and down stairs very well at all.  This all occurred about 2 weeks ago when she was getting up.  Something shifted in her knee.  It has been painful since.  Is been puffy.  No previous problems such as this.    ROS:  GENERAL: No fever or chills  HEENT: no current nosebleed; no new onset visual disturbances  NECK: no swelling  CARDIOVASCULAR:  No severe chest pain  RESPIRATORY:  No severe SOA  ABDOMINAL: no severe abdominal pain  MSK: as per HPI  NEURO: no severe headache; as per HPI  SKIN: no current major rash  HEME: no major bruising or bleeding issues  EXTREMITIES: as per HPI  PSYCH: no delusions/hallucinations    PMH:   Past Medical History:   Diagnosis Date   . Anxiety    . 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 daily   . Chronic pain     right foot/right leg   . 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 Depression    . Hypercholesterolemia    . Hyperlipidemia    . Hypertension    . Obesity    . Peripheral edema     right foot   . Plantar fascial fibromatosis of right foot    . PONV (postoperative nausea and vomiting)     requests Anti Emetic   . RLS (restless legs syndrome)    . Snores     Never has had sleep study   . Tarsal tunnel syndrome, right    . Vitamin D deficiency    . Wears glasses          Past Surgical History:   Procedure  Laterality Date   . HX APPENDECTOMY  childhood   . HX EYE SURGERY Bilateral Childhood    Muscle Surgery   . HX SHOULDER SURGERY Right 06/2016    Exc. Bone Spur, Repair Rotator Cuff Tear   . HX TUBAL LIGATION  1992          Patient Active Problem List    Diagnosis   . Chest pain, pleuritic   . Acute bronchitis   . Plantar fasciitis of right foot   . Hypertension   . Hyperlipidemia   . Vitamin D deficiency   . CVA (cerebrovascular accident) (CMS [facility])   . Anxiety   . Depression   . Obesity   . Plantar fascial fibromatosis of right foot   . Tarsal tunnel syndrome of right side       MEDICATIONS:  Current Outpatient Medications   Medication Sig   . albuterol sulfate (PROAIR HFA) 90 mcg/actuation Inhalation HFA  Aerosol Inhaler Take 1-2 Puffs by inhalation Every 6 hours as needed W/ spacer   . amLODIPine (NORVASC) 5 mg Oral Tablet Take 1 Tab (5 mg total) by mouth Once a day AMLODIPINE BESYLATE   . aspirin 325 mg Oral Tablet Take 325 mg by mouth Once a day   . Ibuprofen (MOTRIN) 800 mg Oral Tablet Take 1 Tab (800 mg total) by mouth Twice per day as needed for Pain   . lisinopriL-hydrochlorothiazide (ZESTORETIC) 20-12.5 mg Oral Tablet Take 1 Tab by mouth Twice daily   . sertraline (ZOLOFT) 50 mg Oral Tablet Take 1 Tab (50 mg total) by mouth Once a day   . simvastatin (ZOCOR) 20 mg Oral Tablet Take 1 Tab (20 mg total) by mouth Every evening   . traZODone (DESYREL) 150 mg Oral Tablet TAKE 1 TABLET(150 MG) BY MOUTH EVERY NIGHT   . VENTOLIN HFA 90 mcg/actuation Inhalation HFA Aerosol Inhaler Take 2 Puffs by inhalation Every 6 hours as needed        ALLERGIES:   No Known Allergies     FAMILY HISTORY:  Family Medical History:     Problem Relation (Age of Onset)    Alzheimer's/Dementia Mother, Father    Arthritis-osteo Mother, Father    Blood Clots Father    Congestive Heart Failure Father    Coronary Artery Disease Father    Diabetes Father    Heart Attack Mother, Father    High Cholesterol Mother, Father    Hypertension (High  Blood Pressure) Mother, Father    Migraines Mother, Father    Sleep disorders Father    Stroke Mother, Father              SOCIAL HISTORY:   reports that she has never smoked. She has never used smokeless tobacco. She reports that she does not drink alcohol and does not use drugs.    PHYSICAL EXAM:  BP 110/80 (Site: Right, Patient Position: Sitting, Cuff Size: Adult)   Pulse 79   Temp 36 C (96.8 F) (Thermal Scan)   Ht 1.575 m (5\' 2" )   Wt 99.8 kg (220 lb)   BMI 40.24 kg/m       GENERAL:  No acute distress; clean, appropriately dressed; well-nourished  HEENT: head normocephalic, atraumatic; nose w/out rhinorrhea/epistaxis  EYES: EOMI; vision intact   NECK: no JVD or obvious swelling  CARDIOVASCULAR: no pallor or cyanosis  RESPIRATORY:  non-labored breathing; symmetric chest wall rise bilat; no audible wheezing  ABDOMEN: no severe distention  NEURO:  Alert and oriented x3; conversant  EXTREMITIES: no diffuse obvious swelling  HEME: no diffuse obvious bruising or bleeding  PSYCH: normal affect and mood  MSK:  Her shoulders have fairly good range of motion.  Forward flexion about 150.  Internal rotation about L3.  External rotation relatively full.  She is relatively happy with these.    Her left knee lacks full extension by about 7-10.  Flexion is painful beyond about 90.  There is a McMurray suggestive of medial meniscus pathology.  Extensor mechanism intact.  No previous cicatrix.  Calf is supple.  She really hobbles and has a very short gait.  She cannot extend her knee.    IMAGING:   Today standing AP, lateral, patellofemoral views of the left knee are obtained.  Moderate patellofemoral osteoarthritis.  No loose bodies.    Images were personally reviewed and independently interpreted by me at today's visit.   , DO  02/18/2020, 10:32  ASSESSMENT/PLAN:    ICD-10-CM    1. Knee internal derangement, left  M23.92 XR KNEE LEFT 3 VIEW     ASPIRATION/INJECTION MAJOR JOINT/BURSA (AMB ONLY)     MRI  KNEE LEFT WO CONTRAST     She has a significant issue which is acute.  She has some osteoarthritis but this does not explain her problem.  She has a locked knee and requires an MRI.    For now we will try to help her with a steroid injection.  We will see if this may help her somewhat.    We will see her back after him MRI.    Please see procedure note      Priscille Kluver, DO  02/18/2020, 10:32    Portions of this note may be dictated using voice recognition software or a dictation service. Variances in spelling and vocabulary are possible and unintentional. Not all errors are caught/corrected. Please notify the [facility] if any discrepancies are noted or if the meaning of any statement is not clear.

## 2020-02-21 ENCOUNTER — Encounter (INDEPENDENT_AMBULATORY_CARE_PROVIDER_SITE_OTHER): Payer: Self-pay

## 2020-02-21 ENCOUNTER — Ambulatory Visit (HOSPITAL_COMMUNITY): Payer: Self-pay

## 2020-03-03 ENCOUNTER — Other Ambulatory Visit: Payer: Self-pay

## 2020-03-03 ENCOUNTER — Ambulatory Visit
Admission: RE | Admit: 2020-03-03 | Discharge: 2020-03-03 | Disposition: A | Discharge status: Home / Self Care | Payer: Medicare Other | Source: Ambulatory Visit | Attending: Orthopaedic Surgery | Admitting: Orthopaedic Surgery

## 2020-03-03 DIAGNOSIS — M2392 Unspecified internal derangement of left knee: Secondary | ICD-10-CM

## 2020-03-03 DIAGNOSIS — S83512A Sprain of anterior cruciate ligament of left knee, initial encounter: Secondary | ICD-10-CM | POA: Insufficient documentation

## 2020-03-06 ENCOUNTER — Ambulatory Visit (HOSPITAL_COMMUNITY)
Admission: RE | Admit: 2020-03-06 | Discharge: 2020-03-06 | Disposition: A | Discharge status: Home / Self Care | Payer: Medicare Other | Source: Ambulatory Visit | Attending: Orthopaedic Surgery | Admitting: Orthopaedic Surgery

## 2020-03-06 ENCOUNTER — Ambulatory Visit (INDEPENDENT_AMBULATORY_CARE_PROVIDER_SITE_OTHER): Payer: Medicare Other | Admitting: Orthopaedic Surgery

## 2020-03-06 ENCOUNTER — Encounter (HOSPITAL_BASED_OUTPATIENT_CLINIC_OR_DEPARTMENT_OTHER): Payer: Self-pay | Admitting: Orthopaedic Surgery

## 2020-03-06 ENCOUNTER — Other Ambulatory Visit: Payer: Self-pay

## 2020-03-06 ENCOUNTER — Ambulatory Visit (HOSPITAL_BASED_OUTPATIENT_CLINIC_OR_DEPARTMENT_OTHER): Payer: Medicare Other

## 2020-03-06 ENCOUNTER — Ambulatory Visit
Admission: RE | Admit: 2020-03-06 | Discharge: 2020-03-06 | Disposition: A | Discharge status: Home / Self Care | Payer: Medicare Other | Source: Ambulatory Visit | Attending: Orthopaedic Surgery | Admitting: Orthopaedic Surgery

## 2020-03-06 VITALS — BP 118/62 | HR 97 | Temp 95.1°F | Ht 62.0 in | Wt 228.4 lb

## 2020-03-06 DIAGNOSIS — M79605 Pain in left leg: Secondary | ICD-10-CM

## 2020-03-06 DIAGNOSIS — Z79899 Other long term (current) drug therapy: Secondary | ICD-10-CM | POA: Insufficient documentation

## 2020-03-06 DIAGNOSIS — M1712 Unilateral primary osteoarthritis, left knee: Secondary | ICD-10-CM

## 2020-03-06 DIAGNOSIS — Z01818 Encounter for other preprocedural examination: Secondary | ICD-10-CM | POA: Insufficient documentation

## 2020-03-06 DIAGNOSIS — M2392 Unspecified internal derangement of left knee: Secondary | ICD-10-CM

## 2020-03-06 DIAGNOSIS — Z01812 Encounter for preprocedural laboratory examination: Secondary | ICD-10-CM | POA: Insufficient documentation

## 2020-03-06 DIAGNOSIS — R9431 Abnormal electrocardiogram [ECG] [EKG]: Secondary | ICD-10-CM

## 2020-03-06 DIAGNOSIS — Z01810 Encounter for preprocedural cardiovascular examination: Secondary | ICD-10-CM | POA: Insufficient documentation

## 2020-03-06 LAB — COMPREHENSIVE METABOLIC PROFILE - BMC/JMC ONLY
ALBUMIN/GLOBULIN RATIO: 1.2 (ref 0.8–2.0)
ALBUMIN: 3.7 g/dL (ref 3.5–5.0)
ALKALINE PHOSPHATASE: 78 U/L (ref 38–126)
ALT (SGPT): 26 U/L (ref 14–54)
ANION GAP: 8 mmol/L (ref 3–11)
AST (SGOT): 20 U/L (ref 15–41)
BILIRUBIN TOTAL: 1.1 mg/dL (ref 0.3–1.2)
BUN/CREA RATIO: 23 — ABNORMAL HIGH (ref 6–22)
BUN: 18 mg/dL (ref 6–20)
CALCIUM: 9.3 mg/dL (ref 8.8–10.2)
CHLORIDE: 105 mmol/L (ref 101–111)
CO2 TOTAL: 29 mmol/L (ref 22–32)
CREATININE: 0.79 mg/dL (ref 0.44–1.00)
ESTIMATED GFR: >60 mL/min/{1.73_m2} (ref >60)
GLUCOSE: 126 mg/dL — ABNORMAL HIGH (ref 70–110)
POTASSIUM: 4.1 mmol/L (ref 3.4–5.1)
PROTEIN TOTAL: 6.8 g/dL (ref 6.4–8.3)
SODIUM: 142 mmol/L (ref 136–145)

## 2020-03-06 LAB — CBC W/AUTO DIFF
BASOPHIL #: 0 10*3/uL (ref 0.00–0.10)
BASOPHIL %: 0 % (ref 0–3)
EOSINOPHIL #: 0.2 10*3/uL (ref 0.00–0.50)
EOSINOPHIL %: 2 % (ref 0–5)
HCT: 38.1 % (ref 36.0–45.0)
HGB: 13.6 g/dL (ref 12.0–15.5)
LYMPHOCYTE #: 1.9 10*3/uL (ref 1.00–4.80)
LYMPHOCYTE %: 22 % (ref 15–43)
MCH: 34.4 pg — ABNORMAL HIGH (ref 27.5–33.2)
MCHC: 35.8 g/dL (ref 32.0–36.0)
MCV: 96 fL (ref 82.0–97.0)
MONOCYTE #: 0.7 10*3/uL (ref 0.20–0.90)
MONOCYTE %: 8 % (ref 5–12)
MPV: 10.6 fL — ABNORMAL HIGH (ref 7.4–10.5)
NEUTROPHIL #: 6 10*3/uL (ref 1.50–6.50)
NEUTROPHIL %: 69 % (ref 43–76)
PLATELETS: 179 10*3/uL (ref 150–450)
RBC: 3.97 10*6/uL — ABNORMAL LOW (ref 4.00–5.10)
RDW: 12.5 % (ref 11.0–16.0)
WBC: 8.7 10*3/uL (ref 4.0–11.0)

## 2020-03-06 LAB — URINALYSIS, MACROSCOPIC
BILIRUBIN: NEGATIVE mg/dL
GLUCOSE: NEGATIVE mg/dL
KETONES: NEGATIVE mg/dL
NITRITE: NEGATIVE
PH: 5.5 (ref <8.0)
PROTEIN: NEGATIVE mg/dL
SPECIFIC GRAVITY: 1.029 — ABNORMAL HIGH (ref <1.022)
UROBILINOGEN: <2 mg/dL (ref <=2.0)

## 2020-03-06 LAB — URINALYSIS, MICROSCOPIC

## 2020-03-06 LAB — PT/INR
INR: 1
PROTHROMBIN TIME: 11.6 seconds (ref 9.4–12.5)

## 2020-03-06 LAB — MRSA COLONIZATION SCREEN, PCR: MRSA COLONIZATION SCREEN: POSITIVE — AB

## 2020-03-06 NOTE — Progress Notes (Signed)
[facility], [place] [facility]  [place]  [place] [address]  Dept: [phone]  Dept Fax: [phone]  Loc: [phone]  Loc Fax: [phone]    CC:    Chief Complaint   Patient presents with   . MRI Results     FOLLOW UP FOR MRI RESULTS        HPI:   Toni Tran is a 64 y.o. female who presents to the clinic for follow-up after a left knee MRI.  She still struggles a lot to walk.  She cannot walk evenly.  Her distance ambulating is maybe had across the football field short distance only.  She continues to have swelling of her knee.  No hip complaints..    ROS:  GENERAL: No fever or chills  HEENT: no current nosebleed; no new onset visual disturbances  NECK: no swelling  CARDIOVASCULAR:  No severe chest pain  RESPIRATORY:  No severe SOA  ABDOMINAL: no severe abdominal pain  MSK: as per HPI  NEURO: no severe headache; as per HPI  SKIN: no current major rash  HEME: no major bruising or bleeding issues  EXTREMITIES: as per HPI  PSYCH: no delusions/hallucinations    PMH:   Past Medical History:   Diagnosis Date   . Anxiety    . 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 daily   . Chronic pain     right foot/right leg   . 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 Depression    . Hypercholesterolemia    . Hyperlipidemia    . Hypertension    . Obesity    . Peripheral edema     right foot   . Plantar fascial fibromatosis of right foot    . PONV (postoperative nausea and vomiting)     requests Anti Emetic   . RLS (restless legs syndrome)    . Snores     Never has had sleep study   . Tarsal tunnel syndrome, right    . Vitamin D deficiency    . Wears glasses          Past Surgical History:   Procedure Laterality Date   . HX APPENDECTOMY  childhood   . HX EYE SURGERY Bilateral Childhood    Muscle Surgery   . HX SHOULDER SURGERY Right 06/2016    Exc. Bone Spur, Repair  Rotator Cuff Tear   . HX TUBAL LIGATION  1992          Patient Active Problem List    Diagnosis   . Chest pain, pleuritic   . Acute bronchitis   . Plantar fasciitis of right foot   . Hypertension   . Hyperlipidemia   . Vitamin D deficiency   . CVA (cerebrovascular accident) (CMS [place])   . Anxiety   . Depression   . Obesity   . Plantar fascial fibromatosis of right foot   . Tarsal tunnel syndrome of right side       MEDICATIONS:  Current Outpatient Medications   Medication Sig   . albuterol sulfate (PROAIR HFA) 90 mcg/actuation Inhalation HFA Aerosol Inhaler Take 1-2 Puffs by inhalation Every 6 hours as needed W/ spacer   . amLODIPine (NORVASC) 5 mg Oral Tablet Take 1 Tab (5 mg total) by mouth Once a day AMLODIPINE BESYLATE   . aspirin 325  mg Oral Tablet Take 325 mg by mouth Once a day   . Ibuprofen (MOTRIN) 800 mg Oral Tablet Take 1 Tab (800 mg total) by mouth Twice per day as needed for Pain   . lisinopriL-hydrochlorothiazide (ZESTORETIC) 20-12.5 mg Oral Tablet Take 1 Tab by mouth Twice daily   . sertraline (ZOLOFT) 50 mg Oral Tablet Take 1 Tab (50 mg total) by mouth Once a day   . simvastatin (ZOCOR) 20 mg Oral Tablet Take 1 Tab (20 mg total) by mouth Every evening   . traZODone (DESYREL) 150 mg Oral Tablet TAKE 1 TABLET(150 MG) BY MOUTH EVERY NIGHT   . VENTOLIN HFA 90 mcg/actuation Inhalation HFA Aerosol Inhaler Take 2 Puffs by inhalation Every 6 hours as needed        ALLERGIES:   No Known Allergies     FAMILY HISTORY:  Family Medical History:     Problem Relation (Age of Onset)    Alzheimer's/Dementia Mother, Father    Arthritis-osteo Mother, Father    Blood Clots Father    Congestive Heart Failure Father    Coronary Artery Disease Father    Diabetes Father    Heart Attack Mother, Father    High Cholesterol Mother, Father    Hypertension (High Blood Pressure) Mother, Father    Migraines Mother, Father    Sleep disorders Father    Stroke Mother, Father              SOCIAL HISTORY:   reports that she has never  smoked. She has never used smokeless tobacco. She reports that she does not drink alcohol and does not use drugs.    PHYSICAL EXAM:  BP 118/62   Pulse 97   Temp 35.1 C (95.1 F) (Thermal Scan)   Ht 1.575 m (5\' 2" )   Wt 104 kg (228 lb 6.4 oz)   SpO2 99%   BMI 41.77 kg/m       GENERAL:  No acute distress; clean, appropriately dressed; well-nourished  HEENT: head normocephalic, atraumatic; nose w/out rhinorrhea/epistaxis  EYES: EOMI; vision intact   NECK: no JVD or obvious swelling  CARDIOVASCULAR: no pallor or cyanosis  RESPIRATORY:  non-labored breathing; symmetric chest wall rise bilat; no audible wheezing  ABDOMEN: no severe distention  NEURO:  Alert and oriented x3; conversant  EXTREMITIES: no diffuse obvious swelling  HEME: no diffuse obvious bruising or bleeding  PSYCH: normal affect and mood  MSK:  She walks with a marked antalgic gait with the knee bent.  She lacks full extension by about 10.  Flexion about 65.  Collateral seem intact.  Some ACL laxity.  Patellofemoral crepitus is present, her skin is clear.  IMAGING:   The MRI identifies a root tear of the medial meniscus, chronic ACL tear, medial and patellofemoral osteoarthritis.  Images were personally reviewed and independently interpreted by me at today's visit.   , DO  03/06/2020, 11:15       ASSESSMENT/PLAN:    ICD-10-CM    1. Knee internal derangement, left  M23.92 CBC W/AUTO DIFF     COMPREHENSIVE METABOLIC PROFILE - [facility] ONLY     PT/INR     URINALYSIS WITH CULTURE REFLEX IF INDICATED [facility] ONLY     MRSA COLONIZATION SCREEN, PCR     HGA1C (HEMOGLOBIN A1C WITH EST AVG GLUCOSE)     XR CHEST PA AND LATERAL     ECG 12-LEAD   2. Primary osteoarthritis of left knee  M17.12 CBC W/AUTO DIFF  COMPREHENSIVE METABOLIC PROFILE - [facility] ONLY     PT/INR     URINALYSIS WITH CULTURE REFLEX IF INDICATED [facility] ONLY     MRSA COLONIZATION SCREEN, PCR     HGA1C (HEMOGLOBIN A1C WITH EST AVG GLUCOSE)     XR CHEST PA AND LATERAL     ECG  12-LEAD   3. Pain of left lower extremity  M79.605 CBC W/AUTO DIFF     COMPREHENSIVE METABOLIC PROFILE - [facility] ONLY     PT/INR     URINALYSIS WITH CULTURE REFLEX IF INDICATED [facility] ONLY     MRSA COLONIZATION SCREEN, PCR     HGA1C (HEMOGLOBIN A1C WITH EST AVG GLUCOSE)     XR CHEST PA AND LATERAL     ECG 12-LEAD     Discussions were held in regards to reconstructive surgery.  With her level discomfort, she would not be a very tolerant of 6-8 weeks of nonweightbearing using crutches.  Degenerative conditions are also present.  Her best course of action is a total knee arthroplasty.  This allow her to weight bear fully right away and begin active motion exercises as well.  This is briefly discussed and she would like to proceed.  We will go ahead and get preop medical, dental and lab testing done.  We will see her back after these are completed for further discussion with a preop visit.  Questions answered for now  [name], DO  03/06/2020, 11:15    Portions of this note may be dictated using voice recognition software or a dictation service. Variances in spelling and vocabulary are possible and unintentional. Not all errors are caught/corrected. Please notify the [facility] if any discrepancies are noted or if the meaning of any statement is not clear.

## 2020-03-07 LAB — URINE CULTURE,ROUTINE: URINE CULTURE: 70000 — AB

## 2020-03-07 LAB — HGA1C (HEMOGLOBIN A1C WITH EST AVG GLUCOSE)
ESTIMATED AVERAGE GLUCOSE: 94 mg/dL
HEMOGLOBIN A1C: 4.9 % (ref 4.0–5.6)

## 2020-03-10 ENCOUNTER — Telehealth (HOSPITAL_BASED_OUTPATIENT_CLINIC_OR_DEPARTMENT_OTHER): Payer: Self-pay | Admitting: Orthopaedic Surgery

## 2020-03-10 LAB — ECG 12-LEAD
Atrial Rate: 62 {beats}/min
Calculated P Axis: 56 degrees
Calculated R Axis: -9 degrees
Calculated T Axis: 25 degrees
PR Interval: 156 ms
QRS Duration: 90 ms
QT Interval: 416 ms
QTC Calculation: 422 ms
Ventricular rate: 62 {beats}/min

## 2020-03-10 NOTE — Telephone Encounter (Addendum)
Called patient and advised her that she would need to see her PCP to be treated for the infections. Patient voiced understanding and stated she would call her PCP office. Fax was sent to Dr Theophilus Bones Oshiyoye's office with all lab results. Fax confirmation received. Lillette Boxer, [place]  03/10/2020, 10:52       ----- Message from [name], DO sent at 03/07/2020 12:48 PM EDT -----  Please call patient with the lab results    You have infections that need to be cared for and retested negative before any surgery

## 2020-03-11 ENCOUNTER — Encounter (INDEPENDENT_AMBULATORY_CARE_PROVIDER_SITE_OTHER): Payer: Medicare Other | Admitting: Family Medicine

## 2020-03-12 ENCOUNTER — Ambulatory Visit (INDEPENDENT_AMBULATORY_CARE_PROVIDER_SITE_OTHER): Payer: Medicare Other | Admitting: Family Medicine

## 2020-03-12 ENCOUNTER — Other Ambulatory Visit: Payer: Self-pay

## 2020-03-12 ENCOUNTER — Encounter (INDEPENDENT_AMBULATORY_CARE_PROVIDER_SITE_OTHER): Payer: Self-pay | Admitting: Family Medicine

## 2020-03-12 VITALS — BP 160/67 | HR 74 | Temp 98.8°F | Resp 20 | Ht 62.2 in | Wt 224.5 lb

## 2020-03-12 DIAGNOSIS — I10 Essential (primary) hypertension: Secondary | ICD-10-CM

## 2020-03-12 DIAGNOSIS — N390 Urinary tract infection, site not specified: Secondary | ICD-10-CM

## 2020-03-12 DIAGNOSIS — E785 Hyperlipidemia, unspecified: Secondary | ICD-10-CM

## 2020-03-12 DIAGNOSIS — Z22322 Carrier or suspected carrier of Methicillin resistant Staphylococcus aureus: Secondary | ICD-10-CM

## 2020-03-12 MED ORDER — SULFAMETHOXAZOLE 800 MG-TRIMETHOPRIM 160 MG TABLET
1.00 | ORAL_TABLET | Freq: Two times a day (BID) | ORAL | 0 refills | Status: AC
Start: 2020-03-12 — End: 2020-03-19

## 2020-03-12 MED ORDER — AMLODIPINE 10 MG TABLET
10.00 mg | ORAL_TABLET | Freq: Every day | ORAL | 1 refills | Status: DC
Start: 2020-03-12 — End: 2020-08-20

## 2020-03-12 MED ORDER — MUPIROCIN 2 % TOPICAL OINTMENT
TOPICAL_OINTMENT | Freq: Three times a day (TID) | CUTANEOUS | 0 refills | Status: DC
Start: 2020-03-12 — End: 2021-09-15

## 2020-03-12 NOTE — Progress Notes (Signed)
[facility]  [address]  [place] [address]  Phone: [phone]  Fax: [phone]    Encounter Date: 03/12/2020    Patient ID:  Toni Tran  HGD:[id number]    DOB: [birth year]  Age: 64 y.o. female    Subjective:     Chief Complaint   Patient presents with   . Establish Care     FU infections     HPI   63y/o obese female w/ h/o HTN, HLD, h/o cva for follow up appt. To get knee replacment but found to have mrsa positive nare swab and uti. Which need to be treated before she can have surgery.  Compliant w/ medication. Issue w/ left knee, locks in place atimes as well.   Current Outpatient Medications   Medication Sig   . albuterol sulfate (PROAIR HFA) 90 mcg/actuation Inhalation HFA Aerosol Inhaler Take 1-2 Puffs by inhalation Every 6 hours as needed W/ spacer   . amLODIPine (NORVASC) 10 mg Oral Tablet Take 1 Tablet (10 mg total) by mouth Once a day AMLODIPINE BESYLATE   . aspirin 325 mg Oral Tablet Take 325 mg by mouth Once a day   . Ibuprofen (MOTRIN) 800 mg Oral Tablet Take 1 Tab (800 mg total) by mouth Twice per day as needed for Pain   . lisinopriL-hydrochlorothiazide (ZESTORETIC) 20-12.5 mg Oral Tablet Take 1 Tab by mouth Twice daily   . mupirocin (BACTROBAN) 2 % Ointment by Apply Topically route Three times a day   . sertraline (ZOLOFT) 50 mg Oral Tablet Take 1 Tab (50 mg total) by mouth Once a day   . simvastatin (ZOCOR) 20 mg Oral Tablet Take 1 Tab (20 mg total) by mouth Every evening   . traZODone (DESYREL) 150 mg Oral Tablet TAKE 1 TABLET(150 MG) BY MOUTH EVERY NIGHT   . trimethoprim-sulfamethoxazole (BACTRIM DS) 160-800mg  per tablet Take 1 Tablet (160 mg total) by mouth Twice daily for 7 days   . VENTOLIN HFA 90 mcg/actuation Inhalation HFA Aerosol Inhaler Take 2 Puffs by inhalation Every 6 hours as needed     No Known Allergies  Past Medical History:   Diagnosis Date   . Anxiety    . 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  daily   . Chronic pain     right foot/right leg   . 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 Depression    . Hypercholesterolemia    . Hyperlipidemia    . Hypertension    . MRSA infection    . Obesity    . Peripheral edema     right foot   . Plantar fascial fibromatosis of right foot    . PONV (postoperative nausea and vomiting)     requests Anti Emetic   . RLS (restless legs syndrome)    . Snores     Never has had sleep study   . Tarsal tunnel syndrome, right    . Vitamin D deficiency    . Wears glasses          Past Surgical History:   Procedure Laterality Date   . HX APPENDECTOMY  childhood   . HX EYE SURGERY Bilateral Childhood    Muscle Surgery   . HX SHOULDER SURGERY Right 06/2016    Exc. Bone Spur, Repair Rotator Cuff Tear   . HX  TUBAL LIGATION  1992         Family Medical History:     Problem Relation (Age of Onset)    Alzheimer's/Dementia Mother, Father    Arthritis-osteo Mother, Father    Blood Clots Father    Congestive Heart Failure Father    Coronary Artery Disease Father    Diabetes Father    Heart Attack Mother, Father    High Cholesterol Mother, Father    Hypertension (High Blood Pressure) Mother, Father    Migraines Mother, Father    Sleep disorders Father    Stroke Mother, Father            Social History     Tobacco Use   . Smoking status: Never Smoker   . Smokeless tobacco: Never Used   Substance Use Topics   . Alcohol use: No   . Drug use: No       Review of Systems   Constitutional: Negative.    HENT: Negative.    Eyes: Negative.    Respiratory: Negative.    Cardiovascular: Negative.    Gastrointestinal: Negative.    Musculoskeletal: Negative.    Skin: Negative.    Neurological: Negative.    Psychiatric/Behavioral: Negative.      Objective:   Vitals: BP (!) 160/67   Pulse 74   Temp 37.1 C (98.8 F)   Resp 20   Ht 1.58 m (5' 2.2")   Wt 102 kg (224 lb 8 oz)   LMP  (LMP Unknown)   SpO2 98%   Breastfeeding  No   BMI 40.80 kg/m         Physical Exam  Constitutional:       Appearance: Normal appearance.   HENT:      Head: Normocephalic and atraumatic.   Neurological:      General: No focal deficit present.      Mental Status: She is alert and oriented to person, place, and time.   Psychiatric:         Behavior: Behavior normal.         Thought Content: Thought content normal.         Judgment: Judgment normal.         Assessment & Plan:     ENCOUNTER DIAGNOSES     ICD-10-CM   1. UTI (urinary tract infection)  N39.0   2. MRSA nasal colonization  Z22.322   3. Hypertension, unspecified type  I10   4. Hyperlipidemia, unspecified hyperlipidemia type  E78.5     Oral abx  incr BP medication  Topical abx  Orders Placed This Encounter   . MRSA COLONIZATION SCREEN, PCR   . trimethoprim-sulfamethoxazole (BACTRIM DS) 160-800mg  per tablet   . amLODIPine (NORVASC) 10 mg Oral Tablet   . mupirocin (BACTROBAN) 2 % Ointment       No follow-ups on file.    Lavinia Sharps, MD

## 2020-03-17 ENCOUNTER — Encounter (INDEPENDENT_AMBULATORY_CARE_PROVIDER_SITE_OTHER): Payer: Medicare Other | Admitting: Family Medicine

## 2020-03-21 ENCOUNTER — Ambulatory Visit: Payer: Medicare Other | Attending: Family Medicine

## 2020-03-21 ENCOUNTER — Other Ambulatory Visit: Payer: Self-pay

## 2020-03-21 DIAGNOSIS — Z22322 Carrier or suspected carrier of Methicillin resistant Staphylococcus aureus: Secondary | ICD-10-CM

## 2020-03-21 LAB — MRSA COLONIZATION SCREEN, PCR: MRSA COLONIZATION SCREEN: NEGATIVE

## 2020-03-26 ENCOUNTER — Encounter (INDEPENDENT_AMBULATORY_CARE_PROVIDER_SITE_OTHER): Payer: Self-pay | Admitting: Family Medicine

## 2020-03-26 ENCOUNTER — Other Ambulatory Visit: Payer: Self-pay

## 2020-03-26 ENCOUNTER — Ambulatory Visit (INDEPENDENT_AMBULATORY_CARE_PROVIDER_SITE_OTHER): Payer: Medicare Other | Admitting: Family Medicine

## 2020-03-26 VITALS — BP 143/86 | HR 75 | Temp 98.2°F | Resp 12 | Ht 62.2 in | Wt 225.0 lb

## 2020-03-26 DIAGNOSIS — N390 Urinary tract infection, site not specified: Secondary | ICD-10-CM

## 2020-03-26 DIAGNOSIS — Z22322 Carrier or suspected carrier of Methicillin resistant Staphylococcus aureus: Secondary | ICD-10-CM

## 2020-03-26 DIAGNOSIS — F419 Anxiety disorder, unspecified: Secondary | ICD-10-CM

## 2020-03-26 NOTE — Progress Notes (Signed)
[facility]  [place]  [place] [address]  Phone: [phone]  Fax: [phone]    Encounter Date: 03/26/2020    Patient ID:  Toni Tran  OMV:[id number]    DOB: 04/22/1956  Age: 64 y.o. female    Subjective:     Chief Complaint   Patient presents with    Establish Care     Pre-Op     HPI   64y/o female w/ h/o CVA, HLD, HTN for follow up appt. Knee replacement surgery pending clearance of mrsa and UTI. Pt finished bactrim and minocyclin. mrsa swab negative. Full course abx for UTI. No symptoms.   Current Outpatient Medications   Medication Sig    albuterol sulfate (PROAIR HFA) 90 mcg/actuation Inhalation HFA Aerosol Inhaler Take 1-2 Puffs by inhalation Every 6 hours as needed W/ spacer    amLODIPine (NORVASC) 10 mg Oral Tablet Take 1 Tablet (10 mg total) by mouth Once a day AMLODIPINE BESYLATE    aspirin 325 mg Oral Tablet Take 325 mg by mouth Once a day    Ibuprofen (MOTRIN) 800 mg Oral Tablet Take 1 Tab (800 mg total) by mouth Twice per day as needed for Pain    lisinopriL-hydrochlorothiazide (ZESTORETIC) 20-12.5 mg Oral Tablet Take 1 Tab by mouth Twice daily    mupirocin (BACTROBAN) 2 % Ointment by Apply Topically route Three times a day    sertraline (ZOLOFT) 50 mg Oral Tablet Take 1 Tab (50 mg total) by mouth Once a day    simvastatin (ZOCOR) 20 mg Oral Tablet Take 1 Tab (20 mg total) by mouth Every evening    traZODone (DESYREL) 150 mg Oral Tablet TAKE 1 TABLET(150 MG) BY MOUTH EVERY NIGHT    VENTOLIN HFA 90 mcg/actuation Inhalation HFA Aerosol Inhaler Take 2 Puffs by inhalation Every 6 hours as needed     No Known Allergies  Past Medical History:   Diagnosis Date    Anxiety     Blood 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, right sided weakness    Depression      Hypercholesterolemia     Hyperlipidemia     Hypertension     MRSA infection     Obesity     Peripheral edema     right foot    Plantar fascial fibromatosis of right foot     PONV (postoperative nausea and vomiting)     requests Anti Emetic    RLS (restless legs syndrome)     Snores     Never has had sleep study    Tarsal tunnel syndrome, right     Vitamin D deficiency     Wears glasses          Past Surgical History:   Procedure Laterality Date    HX APPENDECTOMY  childhood    HX EYE SURGERY Bilateral Childhood    Muscle Surgery    HX SHOULDER SURGERY Right 06/2016    Exc. Bone Spur, Repair Rotator Cuff Tear    HX TUBAL LIGATION  1992         Family Medical History:     Problem Relation (Age of Onset)    Alzheimer's/Dementia Mother, Father    Arthritis-osteo Mother, Father  Blood Clots Father    Congestive Heart Failure Father    Coronary Artery Disease Father    Diabetes Father    Heart Attack Mother, Father    High Cholesterol Mother, Father    Hypertension (High Blood Pressure) Mother, Father    Migraines Mother, Father    Sleep disorders Father    Stroke Mother, Father            Social History     Tobacco Use    Smoking status: Never Smoker    Smokeless tobacco: Never Used   Substance Use Topics    Alcohol use: No    Drug use: No       Review of Systems   Constitutional: Negative.    HENT: Negative.    Eyes: Negative.    Respiratory: Negative.    Cardiovascular: Negative.    Gastrointestinal: Negative.    Musculoskeletal: Negative.    Skin: Negative.    Neurological: Negative.    Psychiatric/Behavioral: Negative.      Objective:   Vitals: BP (!) 143/86    Pulse 75    Temp 36.8 C (98.2 F)    Resp 12    Ht 1.58 m (5' 2.2")    Wt 102 kg (225 lb)    LMP  (LMP Unknown)    SpO2 99%    Breastfeeding No    BMI 40.89 kg/m         Physical Exam  Constitutional:       Appearance: Normal appearance. She is obese.   HENT:      Head: Normocephalic and atraumatic.   Neurological:      General: No  focal deficit present.      Mental Status: She is alert and oriented to person, place, and time.   Psychiatric:         Behavior: Behavior normal.         Thought Content: Thought content normal.         Judgment: Judgment normal.         Assessment & Plan:     ENCOUNTER DIAGNOSES     ICD-10-CM   1. MRSA nasal colonization  Z22.322   2. UTI (urinary tract infection)  N39.0   3. Anxiety  F41.9   cleared of mrsa and txd for uti    Orders Placed This Encounter    URINALYSIS WITH CULTURE REFLEX IF INDICATED [facility] ONLY       No follow-ups on file.    [name], MD

## 2020-03-28 ENCOUNTER — Other Ambulatory Visit: Payer: Self-pay

## 2020-03-28 ENCOUNTER — Ambulatory Visit: Payer: Medicare Other | Attending: Family Medicine

## 2020-03-28 DIAGNOSIS — N390 Urinary tract infection, site not specified: Secondary | ICD-10-CM | POA: Insufficient documentation

## 2020-03-28 LAB — URINALYSIS, MACROSCOPIC
BILIRUBIN: NEGATIVE mg/dL
BLOOD: NEGATIVE mg/dL
GLUCOSE: NEGATIVE mg/dL
KETONES: NEGATIVE mg/dL
LEUKOCYTES: NEGATIVE WBCs/uL
NITRITE: NEGATIVE
PH: 6 (ref <8.0)
PROTEIN: NEGATIVE mg/dL
SPECIFIC GRAVITY: 1.028 — ABNORMAL HIGH (ref <1.022)
UROBILINOGEN: <2 mg/dL (ref <=2.0)

## 2020-04-11 ENCOUNTER — Telehealth (INDEPENDENT_AMBULATORY_CARE_PROVIDER_SITE_OTHER): Payer: Self-pay | Admitting: Family Medicine

## 2020-04-11 MED ORDER — TRAZODONE 150 MG TABLET
ORAL_TABLET | ORAL | 1 refills | Status: DC
Start: 2020-04-11 — End: 2020-08-20

## 2020-04-11 MED ORDER — LISINOPRIL 20 MG-HYDROCHLOROTHIAZIDE 12.5 MG TABLET
1.00 | ORAL_TABLET | Freq: Two times a day (BID) | ORAL | 1 refills | Status: DC
Start: 2020-04-11 — End: 2020-08-20

## 2020-04-25 ENCOUNTER — Ambulatory Visit (INDEPENDENT_AMBULATORY_CARE_PROVIDER_SITE_OTHER): Payer: Medicare Other | Admitting: Orthopaedic Surgery

## 2020-04-25 ENCOUNTER — Other Ambulatory Visit: Payer: Self-pay

## 2020-04-25 VITALS — BP 136/66 | HR 83 | Temp 96.6°F | Ht 62.2 in | Wt 227.0 lb

## 2020-04-25 DIAGNOSIS — M24562 Contracture, left knee: Secondary | ICD-10-CM

## 2020-04-25 DIAGNOSIS — M1712 Unilateral primary osteoarthritis, left knee: Secondary | ICD-10-CM

## 2020-04-25 DIAGNOSIS — M2392 Unspecified internal derangement of left knee: Secondary | ICD-10-CM

## 2020-04-25 NOTE — Progress Notes (Signed)
[facility], [place] [facility]  [address]  [place] [address]  Dept: [phone]  Dept Fax: [phone]  Loc: [phone]  Loc Fax: [phone]    CC:    Chief Complaint   Patient presents with   . Pre-op     TOTAL KNEE PRE OP- CLEARANCES IN CHART FOR DENTAL AND PC        HPI:   Toni Tran is a 64 y.o. female who presents to the clinic for further discussion regards to left total knee arthroplasty.  There was a delay due to MRSA and UTI.  She is being cleared from her MRSA and urinary tract infections.  She has clearances from her PCP for surgery and from her dentist as well.  She is quite anxious to get her knee fixed.  She is quite limited by it.  She does space home discharge postop, the following day.  She has organized postoperatively.  She does not have a cold compression unit as yet..     ROS:  GENERAL: No fever or chills  HEENT: no current nosebleed; no new onset visual disturbances  NECK: no swelling  CARDIOVASCULAR:  No severe chest pain  RESPIRATORY:  No severe SOA  ABDOMINAL: no severe abdominal pain  MSK: as per HPI  NEURO: no severe headache; as per HPI  SKIN: no current major rash  HEME: no major bruising or bleeding issues  EXTREMITIES: as per HPI  PSYCH: no delusions/hallucinations    PMH:   Past Medical History:   Diagnosis Date   . Anxiety    . 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 daily   . Chronic pain     right foot/right leg   . 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 Depression    . Hypercholesterolemia    . Hyperlipidemia    . Hypertension    . MRSA infection    . Obesity    . Peripheral edema     right foot   . Plantar fascial fibromatosis of right foot    . PONV (postoperative nausea and vomiting)     requests Anti Emetic   . RLS (restless legs syndrome)    . Snores     Never has had sleep study   . Tarsal tunnel syndrome,  right    . Vitamin D deficiency    . Wears glasses          Past Surgical History:   Procedure Laterality Date   . HX APPENDECTOMY  childhood   . HX EYE SURGERY Bilateral Childhood    Muscle Surgery   . HX SHOULDER SURGERY Right 06/2016    Exc. Bone Spur, Repair Rotator Cuff Tear   . HX TUBAL LIGATION  1992          Patient Active Problem List    Diagnosis   . Chest pain, pleuritic   . Acute bronchitis   . Plantar fasciitis of right foot   . Hypertension   . Hyperlipidemia   . Vitamin D deficiency   . CVA (cerebrovascular accident) (CMS [facility])   . Anxiety   . Depression   . Obesity   . Plantar fascial fibromatosis of right foot   . Tarsal tunnel syndrome of right side       MEDICATIONS:  Current Outpatient Medications  Medication Sig   . albuterol sulfate (PROAIR HFA) 90 mcg/actuation Inhalation HFA Aerosol Inhaler Take 1-2 Puffs by inhalation Every 6 hours as needed W/ spacer   . amLODIPine (NORVASC) 10 mg Oral Tablet Take 1 Tablet (10 mg total) by mouth Once a day AMLODIPINE BESYLATE   . aspirin 325 mg Oral Tablet Take 325 mg by mouth Once a day   . Ibuprofen (MOTRIN) 800 mg Oral Tablet Take 1 Tab (800 mg total) by mouth Twice per day as needed for Pain   . lisinopriL-hydrochlorothiazide (ZESTORETIC) 20-12.5 mg Oral Tablet Take 1 Tablet by mouth Twice daily   . mupirocin (BACTROBAN) 2 % Ointment by Apply Topically route Three times a day   . sertraline (ZOLOFT) 50 mg Oral Tablet Take 1 Tab (50 mg total) by mouth Once a day   . simvastatin (ZOCOR) 20 mg Oral Tablet Take 1 Tab (20 mg total) by mouth Every evening   . traZODone (DESYREL) 150 mg Oral Tablet TAKE 1 TABLET(150 MG) BY MOUTH EVERY NIGHT   . VENTOLIN HFA 90 mcg/actuation Inhalation HFA Aerosol Inhaler Take 2 Puffs by inhalation Every 6 hours as needed        ALLERGIES:   No Known Allergies     FAMILY HISTORY:  Family Medical History:     Problem Relation (Age of Onset)    Alzheimer's/Dementia Mother, Father    Arthritis-osteo Mother, Father    Blood Clots  Father    Congestive Heart Failure Father    Coronary Artery Disease Father    Diabetes Father    Heart Attack Mother, Father    High Cholesterol Mother, Father    Hypertension (High Blood Pressure) Mother, Father    Migraines Mother, Father    Sleep disorders Father    Stroke Mother, Father              SOCIAL HISTORY:   reports that she has never smoked. She has never used smokeless tobacco. She reports that she does not drink alcohol and does not use drugs.    PHYSICAL EXAM:  BP 136/66   Pulse 83   Temp 35.9 C (96.6 F) (Thermal Scan)   Ht 1.58 m (5' 2.2")   Wt 103 kg (227 lb)   LMP  (LMP Unknown)   SpO2 100%   BMI 41.25 kg/m       GENERAL:  No acute distress; clean, appropriately dressed; well-nourished  HEENT: head normocephalic, atraumatic; nose w/out rhinorrhea/epistaxis  EYES: EOMI; vision intact   NECK: no JVD or obvious swelling  CARDIOVASCULAR: no pallor or cyanosis  RESPIRATORY:  non-labored breathing; symmetric chest wall rise bilat; no audible wheezing  ABDOMEN: no severe distention  NEURO:  Alert and oriented x3; conversant  EXTREMITIES: no diffuse obvious swelling  HEME: no diffuse obvious bruising or bleeding  PSYCH: normal affect and mood  MSK:  Her knee has poor motion.  15-80 degrees.  Distal neurovascular intact.  Skin is clear.    IMAGING:   None today, previous MRI showing a root tear and ACL tear  Images were personally reviewed and independently interpreted by me at today's visit.   Toni Kluver, DO  04/25/2020, 10:09       ASSESSMENT/PLAN:  Degenerative changes, meniscus and ACL tears, left knee    A total knee arthroplasty has been discussed she would like to proceed with this.  We will get this going for the next available observation surgical date.  We will continue with exercise programs to  work on for further extension flexion preoperatively.  This limits her potential outcome postoperatively she understands.  Also other risks were outlined.  Consent was obtained to go forward  with surgical intervention, left total knee.  She will try to get a cold compression unit preoperatively.  Toni Level, DO  04/25/2020, 10:09    Portions of this note may be dictated using voice recognition software or a dictation service. Variances in spelling and vocabulary are possible and unintentional. Not all errors are caught/corrected. Please notify the [facility] if any discrepancies are noted or if the meaning of any statement is not clear.

## 2020-04-28 ENCOUNTER — Other Ambulatory Visit (HOSPITAL_BASED_OUTPATIENT_CLINIC_OR_DEPARTMENT_OTHER): Payer: Self-pay | Admitting: Orthopaedic Surgery

## 2020-04-28 ENCOUNTER — Other Ambulatory Visit (HOSPITAL_BASED_OUTPATIENT_CLINIC_OR_DEPARTMENT_OTHER): Payer: Self-pay

## 2020-04-28 ENCOUNTER — Ambulatory Visit
Admission: RE | Admit: 2020-04-28 | Discharge: 2020-04-28 | Disposition: A | Discharge status: Home / Self Care | Payer: Medicare Other | Source: Ambulatory Visit | Attending: Orthopaedic Surgery | Admitting: Orthopaedic Surgery

## 2020-04-28 ENCOUNTER — Other Ambulatory Visit: Payer: Self-pay

## 2020-04-28 ENCOUNTER — Encounter (HOSPITAL_BASED_OUTPATIENT_CLINIC_OR_DEPARTMENT_OTHER): Payer: Self-pay

## 2020-04-28 DIAGNOSIS — Z01818 Encounter for other preprocedural examination: Secondary | ICD-10-CM

## 2020-04-28 DIAGNOSIS — Z01812 Encounter for preprocedural laboratory examination: Secondary | ICD-10-CM

## 2020-04-28 HISTORY — DX: Polyneuropathy, unspecified: G62.9

## 2020-04-28 HISTORY — DX: Personal history of other specified conditions: Z87.898

## 2020-04-28 NOTE — Nurses Notes (Signed)
Patient is asymptomatic at this time for any of the eight COVID-19 symptoms. Patient has been instructed to quarantine for seven days if possible, but must perform strict isolation for at least 48 hours prior to procedure. Patient will take temperature 2 times a day, if they have a thermometer, and bring the log with them day of surgery. Procedure is classified as a category 1 procedure, and requires a pre-operative COVID-19 test to be completed at least 5 days prior to procedure. Patient was instructed to drive up to tent behind [facility] to be swabbed and return home after to quarantine. Any questions patient can call surgeons office for more instruction.

## 2020-04-29 ENCOUNTER — Ambulatory Visit
Admission: RE | Admit: 2020-04-29 | Discharge: 2020-04-29 | Disposition: A | Discharge status: Home / Self Care | Payer: Medicare Other | Source: Ambulatory Visit | Attending: Orthopaedic Surgery | Admitting: Orthopaedic Surgery

## 2020-04-29 ENCOUNTER — Ambulatory Visit (HOSPITAL_COMMUNITY): Payer: Medicare Other

## 2020-04-29 ENCOUNTER — Other Ambulatory Visit: Payer: Self-pay

## 2020-04-29 DIAGNOSIS — Z01812 Encounter for preprocedural laboratory examination: Secondary | ICD-10-CM

## 2020-04-29 LAB — TYPE AND SCREEN
ABO/RH(D): B POS
ANTIBODY SCREEN: NEGATIVE

## 2020-04-29 LAB — ABO & RH: ABO/RH(D): B POS

## 2020-04-29 NOTE — OT Treatment (Signed)
Patient participated in occupational therapy pre-operation education classes. Education included the definition of occupational therapy and what to except from OT intervention, post surgery precautions for knee, and strategies to prevent stress on knees. In addition, adaptive equipment was discussed and demonstrated with this client. Patient was provided with a list of local vendors to purchase devices.  Patient was educated on appropriate durable medical equipment which may be appropriate to increase safety upon return to home.   This therapist discussed a list of things to pack as well as instruction on how to prepare the home post surgery. Patient was provided with a home exercise program to increase upper body strength and lung capacity pre-surgery.

## 2020-04-29 NOTE — PT Treatment (Signed)
[facility] Pre-op Teaching - Physical Therapy    Pt presented to clinic: This pt presented to our clinic for pre-op information on joint replacements.     Pt completed education reguarding: PT was given instructions on what to expect the day of surgery and when PT and OT arrives that day. Instructed pt on exercises , bed mobility, ambulation , stair training, car transfers  and what the options are after surgery for continuing as far skilled nursing facility, HHPT and out services.     Comments: All instructions were given and this pt fully understood them.   .Lennox Leikam, PTA

## 2020-05-01 ENCOUNTER — Ambulatory Visit: Payer: Medicare Other | Attending: Orthopaedic Surgery

## 2020-05-01 ENCOUNTER — Other Ambulatory Visit: Payer: Self-pay

## 2020-05-01 DIAGNOSIS — Z20822 Contact with and (suspected) exposure to covid-19: Secondary | ICD-10-CM | POA: Insufficient documentation

## 2020-05-01 DIAGNOSIS — Z01812 Encounter for preprocedural laboratory examination: Secondary | ICD-10-CM | POA: Insufficient documentation

## 2020-05-01 DIAGNOSIS — Z01818 Encounter for other preprocedural examination: Secondary | ICD-10-CM

## 2020-05-01 LAB — COVID-19 ~~LOC~~ MOLECULAR LAB TESTING: SARS-CoV-2: NOT DETECTED

## 2020-05-05 MED ORDER — CEFAZOLIN 2 GRAM/50 ML IN DEXTROSE (ISO-OSMOTIC) INTRAVENOUS PIGGYBACK
2.0000 g | INJECTION | Freq: Once | INTRAVENOUS | Status: AC
Start: 2020-05-06 — End: 2020-05-06
  Administered 2020-05-06: 2 g via INTRAVENOUS
  Filled 2020-05-05: qty 50

## 2020-05-05 MED ORDER — TRANEXAMIC ACID 1,000 MG/100 ML(10 MG/ML)IN SOD CHLOR,ISO IV PIGGYBACK
1000.0000 mg | INJECTION | Freq: Once | INTRAVENOUS | Status: AC
Start: 2020-05-06 — End: 2020-05-06
  Administered 2020-05-06: 1000 mg via INTRAVENOUS
  Filled 2020-05-05: qty 100

## 2020-05-05 NOTE — H&P (Signed)
[facility], [facility]  [address]  [place] [address]  Dept: [phone]  Dept Fax: [phone]  Loc: [phone]  Loc Fax: [phone]    CC:    No chief complaint on file.      HPI:   Toni Tran is a 64 y.o. female who presents to the clinic for further discussion regards to left total knee arthroplasty.  There was a delay due to MRSA and UTI.  She is being cleared from her MRSA and urinary tract infections.  She has clearances from her PCP for surgery and from her dentist as well.  She is quite anxious to get her knee fixed.  She is quite limited by it.  She does space home discharge postop, the following day.  She has organized postoperatively.  She does not have a cold compression unit as yet..     ROS:  GENERAL: No fever or chills  HEENT: no current nosebleed; no new onset visual disturbances  NECK: no swelling  CARDIOVASCULAR:  No severe chest pain  RESPIRATORY:  No severe SOA  ABDOMINAL: no severe abdominal pain  MSK: as per HPI  NEURO: no severe headache; as per HPI  SKIN: no current major rash  HEME: no major bruising or bleeding issues  EXTREMITIES: as per HPI  PSYCH: no delusions/hallucinations    PMH:   Past Medical History:   Diagnosis Date   . Anxiety    . 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 daily   . Chronic pain     right foot   . 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 Depression    . History of anesthesia complications     Required overnight stay after tarsal tunnel release d/t hypoxia per pt,    . Hypercholesterolemia    . Hyperlipidemia    . Hypertension    . MRSA infection    . Obesity    . Peripheral edema     right foot   . Peripheral neuropathy     Right foot s/p tarsal tunnel release   . Plantar fascial fibromatosis of right foot    . PONV (postoperative nausea and vomiting)     requests Anti Emetic   . RLS (restless  legs syndrome)    . Snores     Never has had sleep study   . Tarsal tunnel syndrome, right    . Vitamin D deficiency    . Wears glasses          Past Surgical History:   Procedure Laterality Date   . HX APPENDECTOMY  childhood   . HX EYE SURGERY Bilateral Childhood    Muscle Surgery   . HX FOOT SURGERY Right 06/07/2017    Tarsal Tunnel release; FASCIOTOMY PLANTAR   . HX SHOULDER SURGERY Right 06/2016    Exc. Bone Spur, Repair Rotator Cuff Tear   . HX TUBAL LIGATION  1992          Patient Active Problem List    Diagnosis   . Chest pain, pleuritic   . Acute bronchitis   . Plantar fasciitis of right foot   . Hypertension   . Hyperlipidemia   . Vitamin D deficiency   . CVA (cerebrovascular accident) (CMS [facility])   . Anxiety   . Depression   . Obesity   .  Plantar fascial fibromatosis of right foot   . Tarsal tunnel syndrome of right side       MEDICATIONS:  Current Outpatient Medications   Medication Sig   . albuterol sulfate (PROAIR HFA) 90 mcg/actuation Inhalation HFA Aerosol Inhaler Take 1-2 Puffs by inhalation Every 6 hours as needed W/ spacer   . amLODIPine (NORVASC) 10 mg Oral Tablet Take 1 Tablet (10 mg total) by mouth Once a day AMLODIPINE BESYLATE   . Ibuprofen (MOTRIN) 800 mg Oral Tablet Take 1 Tab (800 mg total) by mouth Twice per day as needed for Pain   . lisinopriL-hydrochlorothiazide (ZESTORETIC) 20-12.5 mg Oral Tablet Take 1 Tablet by mouth Twice daily   . mupirocin (BACTROBAN) 2 % Ointment by Apply Topically route Three times a day   . sertraline (ZOLOFT) 50 mg Oral Tablet Take 1 Tab (50 mg total) by mouth Once a day (Patient not taking: Reported on 04/28/2020)   . simvastatin (ZOCOR) 20 mg Oral Tablet Take 1 Tab (20 mg total) by mouth Every evening   . traZODone (DESYREL) 150 mg Oral Tablet TAKE 1 TABLET(150 MG) BY MOUTH EVERY NIGHT   . VENTOLIN HFA 90 mcg/actuation Inhalation HFA Aerosol Inhaler Take 2 Puffs by inhalation Every 6 hours as needed        ALLERGIES:   No Known Allergies     FAMILY HISTORY:   Family Medical History:     Problem Relation (Age of Onset)    Alzheimer's/Dementia Mother, Father    Arthritis-osteo Mother, Father    Blood Clots Father    Congestive Heart Failure Father    Coronary Artery Disease Father    Diabetes Father    Heart Attack Mother, Father    High Cholesterol Mother, Father    Hypertension (High Blood Pressure) Mother, Father    Migraines Mother, Father    Sleep disorders Father    Stroke Mother, Father              SOCIAL HISTORY:   reports that she has never smoked. She has never used smokeless tobacco. She reports that she does not drink alcohol and does not use drugs.    PHYSICAL EXAM:  LMP  (LMP Unknown)       GENERAL:  No acute distress; clean, appropriately dressed; well-nourished  HEENT: head normocephalic, atraumatic; nose w/out rhinorrhea/epistaxis  EYES: EOMI; vision intact   NECK: no JVD or obvious swelling  CARDIOVASCULAR: no pallor or cyanosis  RESPIRATORY:  non-labored breathing; symmetric chest wall rise bilat; no audible wheezing  ABDOMEN: no severe distention  NEURO:  Alert and oriented x3; conversant  EXTREMITIES: no diffuse obvious swelling  HEME: no diffuse obvious bruising or bleeding  PSYCH: normal affect and mood  MSK:  Her knee has poor motion.  15-80 degrees.  Distal neurovascular intact.  Skin is clear.    IMAGING:   None today, previous MRI showing a root tear and ACL tear  Images were personally reviewed and independently interpreted by me at today's visit.   [name], DO  04/25/2020, 10:09       ASSESSMENT/PLAN:  Degenerative changes, meniscus and ACL tears, left knee    A total knee arthroplasty has been discussed she would like to proceed with this.  We will get this going for the next available observation surgical date.  We will continue with exercise programs to work on for further extension flexion preoperatively.  This limits her potential outcome postoperatively she understands.  Also other risks were outlined.  Consent was obtained to go  forward with surgical intervention, left total knee.  She will try to get a cold compression unit preoperatively.  Priscille Kluver, DO  04/25/2020, 10:09    Portions of this note may be dictated using voice recognition software or a dictation service. Variances in spelling and vocabulary are possible and unintentional. Not all errors are caught/corrected. Please notify the [facility] if any discrepancies are noted or if the meaning of any statement is not clear.

## 2020-05-06 ENCOUNTER — Encounter (HOSPITAL_COMMUNITY)
Admission: RE | Disposition: A | Discharge status: Home Health | Payer: Self-pay | Source: Ambulatory Visit | Attending: Orthopaedic Surgery

## 2020-05-06 ENCOUNTER — Ambulatory Visit (HOSPITAL_COMMUNITY): Payer: Medicare Other | Admitting: CERTIFIED REGISTERED NURSE ANESTHETIST-CRNA

## 2020-05-06 ENCOUNTER — Observation Stay
Admission: RE | Admit: 2020-05-06 | Discharge: 2020-05-07 | Disposition: A | Discharge status: Home Health | Payer: Medicare Other | Source: Ambulatory Visit | Attending: Orthopaedic Surgery | Admitting: Orthopaedic Surgery

## 2020-05-06 ENCOUNTER — Other Ambulatory Visit: Payer: Self-pay

## 2020-05-06 ENCOUNTER — Encounter (HOSPITAL_COMMUNITY): Payer: Self-pay | Admitting: Orthopaedic Surgery

## 2020-05-06 DIAGNOSIS — Z8673 Personal history of transient ischemic attack (TIA), and cerebral infarction without residual deficits: Secondary | ICD-10-CM | POA: Insufficient documentation

## 2020-05-06 DIAGNOSIS — Z96652 Presence of left artificial knee joint: Secondary | ICD-10-CM

## 2020-05-06 DIAGNOSIS — S83242A Other tear of medial meniscus, current injury, left knee, initial encounter: Secondary | ICD-10-CM

## 2020-05-06 DIAGNOSIS — M1712 Unilateral primary osteoarthritis, left knee: Principal | ICD-10-CM | POA: Insufficient documentation

## 2020-05-06 DIAGNOSIS — M1711 Unilateral primary osteoarthritis, right knee: Secondary | ICD-10-CM

## 2020-05-06 DIAGNOSIS — G2581 Restless legs syndrome: Secondary | ICD-10-CM | POA: Insufficient documentation

## 2020-05-06 DIAGNOSIS — Z79899 Other long term (current) drug therapy: Secondary | ICD-10-CM | POA: Insufficient documentation

## 2020-05-06 DIAGNOSIS — I10 Essential (primary) hypertension: Secondary | ICD-10-CM | POA: Insufficient documentation

## 2020-05-06 DIAGNOSIS — Z6841 Body Mass Index (BMI) 40.0 and over, adult: Secondary | ICD-10-CM | POA: Insufficient documentation

## 2020-05-06 HISTORY — PX: REPLACEMENT TOTAL KNEE: SUR1224

## 2020-05-06 SURGERY — ARTHROPLASTY KNEE TOTAL
Anesthesia: Spinal | Site: Knee | Laterality: Left | Wound class: Clean Wound: Uninfected operative wounds in which no inflammation occurred

## 2020-05-06 MED ORDER — SODIUM CHLORIDE 0.9 % (FLUSH) INJECTION SYRINGE
10.00 mL | INJECTION | INTRAMUSCULAR | Status: DC | PRN
Start: 2020-05-06 — End: 2020-05-07

## 2020-05-06 MED ORDER — BUPIVACAINE LIPOSOME(PF) 1.3 %(13.3 MG/ML) SUSPENSION FOR INFILTRATION
20.0000 mL | Freq: Once | Status: DC
Start: 2020-05-06 — End: 2020-05-07
  Administered 2020-05-06: 0 mL
  Filled 2020-05-06: qty 20

## 2020-05-06 MED ORDER — TRAZODONE 50 MG TABLET
50.00 mg | ORAL_TABLET | Freq: Every evening | ORAL | Status: DC
Start: 2020-05-06 — End: 2020-05-07
  Administered 2020-05-06: 50 mg via ORAL
  Filled 2020-05-06: qty 1

## 2020-05-06 MED ORDER — SODIUM CHLORIDE 0.9% FLUSH BAG - 250 ML
INTRAVENOUS | Status: DC | PRN
Start: 2020-05-06 — End: 2020-05-07

## 2020-05-06 MED ORDER — DROPERIDOL 2.5 MG/ML INJECTION SOLUTION
1.2500 mg | Freq: Once | INTRAMUSCULAR | Status: DC | PRN
Start: 2020-05-06 — End: 2020-05-06
  Administered 2020-05-06: 1.25 mg via INTRAVENOUS
  Filled 2020-05-06: qty 2

## 2020-05-06 MED ORDER — ALBUTEROL SULFATE HFA 90 MCG/ACTUATION AEROSOL INHALER
2.00 | INHALATION_SPRAY | Freq: Four times a day (QID) | RESPIRATORY_TRACT | Status: DC | PRN
Start: 2020-05-06 — End: 2020-05-07

## 2020-05-06 MED ORDER — LACTATED RINGERS INTRAVENOUS SOLUTION
INTRAVENOUS | Status: DC
Start: 2020-05-06 — End: 2020-05-07
  Administered 2020-05-06 (×2): 0 mL via INTRAVENOUS

## 2020-05-06 MED ORDER — AMLODIPINE 5 MG TABLET
10.00 mg | ORAL_TABLET | Freq: Every day | ORAL | Status: DC
Start: 2020-05-06 — End: 2020-05-07
  Administered 2020-05-06: 0 mg via ORAL
  Administered 2020-05-07: 10 mg via ORAL
  Filled 2020-05-06 (×2): qty 2

## 2020-05-06 MED ORDER — HYDROCODONE 7.5 MG-ACETAMINOPHEN 325 MG TABLET
2.00 | ORAL_TABLET | Freq: Four times a day (QID) | ORAL | Status: DC | PRN
Start: 2020-05-06 — End: 2020-05-07
  Administered 2020-05-06 – 2020-05-07 (×4): 2 via ORAL
  Filled 2020-05-06 (×4): qty 2

## 2020-05-06 MED ORDER — ONDANSETRON HCL (PF) 4 MG/2 ML INJECTION SOLUTION
Freq: Once | INTRAMUSCULAR | Status: DC | PRN
Start: 2020-05-06 — End: 2020-05-06
  Administered 2020-05-06: 8 mg via INTRAVENOUS

## 2020-05-06 MED ORDER — PROCHLORPERAZINE EDISYLATE 10 MG/2 ML (5 MG/ML) INJECTION SOLUTION
10.00 mg | Freq: Four times a day (QID) | INTRAMUSCULAR | Status: DC | PRN
Start: 2020-05-06 — End: 2020-05-07
  Administered 2020-05-06 – 2020-05-07 (×2): 10 mg via INTRAVENOUS
  Filled 2020-05-06 (×2): qty 2

## 2020-05-06 MED ORDER — CEFAZOLIN 2 GRAM/100 ML IN DEXTROSE(ISO-OSMOTIC) INTRAVENOUS PIGGYBACK
2.0000 g | INJECTION | Freq: Three times a day (TID) | INTRAVENOUS | Status: AC
Start: 2020-05-06 — End: 2020-05-07
  Administered 2020-05-06: 2 g via INTRAVENOUS
  Administered 2020-05-06 – 2020-05-07 (×2): 0 g via INTRAVENOUS
  Administered 2020-05-07: 2 g via INTRAVENOUS
  Filled 2020-05-06 (×2): qty 100

## 2020-05-06 MED ORDER — DIPHENHYDRAMINE 50 MG/ML INJECTION SOLUTION
Freq: Once | INTRAMUSCULAR | Status: DC | PRN
Start: 2020-05-06 — End: 2020-05-06
  Administered 2020-05-06: 12.5 mg via INTRAVENOUS

## 2020-05-06 MED ORDER — ASPIRIN 325 MG TABLET
325.00 mg | ORAL_TABLET | Freq: Two times a day (BID) | ORAL | Status: DC
Start: 2020-05-06 — End: 2020-05-07
  Administered 2020-05-06 – 2020-05-07 (×2): 325 mg via ORAL
  Filled 2020-05-06 (×3): qty 1

## 2020-05-06 MED ORDER — DOCUSATE SODIUM 100 MG CAPSULE
100.00 mg | ORAL_CAPSULE | Freq: Every day | ORAL | Status: DC
Start: 2020-05-06 — End: 2020-05-07
  Administered 2020-05-06 – 2020-05-07 (×2): 100 mg via ORAL
  Filled 2020-05-06 (×3): qty 1

## 2020-05-06 MED ORDER — HYDROMORPHONE 1 MG/ML INJECTION WRAPPER
1.00 mg | INJECTION | INTRAMUSCULAR | Status: DC | PRN
Start: 2020-05-06 — End: 2020-05-07
  Administered 2020-05-06 – 2020-05-07 (×10): 1 mg via INTRAVENOUS
  Filled 2020-05-06 (×10): qty 1

## 2020-05-06 MED ORDER — KETAMINE 10 MG/ML INJECTION WRAPPER
Freq: Once | INTRAMUSCULAR | Status: DC | PRN
Start: 2020-05-06 — End: 2020-05-06
  Administered 2020-05-06 (×3): 10 mg via INTRAVENOUS

## 2020-05-06 MED ORDER — FENTANYL (PF) 50 MCG/ML INJECTION SOLUTION
Freq: Once | INTRAMUSCULAR | Status: DC | PRN
Start: 2020-05-06 — End: 2020-05-06
  Administered 2020-05-06: 25 ug via INTRAVENOUS
  Administered 2020-05-06: 10 ug via INTRAVENOUS
  Administered 2020-05-06: 50 ug via INTRAVENOUS

## 2020-05-06 MED ORDER — ALUMINUM-MAG HYDROXIDE-SIMETHICONE 200 MG-200 MG-20 MG/5 ML ORAL SUSP
30.00 mL | Freq: Four times a day (QID) | ORAL | Status: DC | PRN
Start: 2020-05-06 — End: 2020-05-07
  Filled 2020-05-06: qty 30

## 2020-05-06 MED ORDER — HYDROCODONE 7.5 MG-ACETAMINOPHEN 325 MG TABLET
1.00 | ORAL_TABLET | ORAL | Status: DC | PRN
Start: 2020-05-06 — End: 2020-05-07

## 2020-05-06 MED ORDER — VANCOMYCIN 1,000 MG INTRAVENOUS INJECTION
INTRAVENOUS | Status: AC
Start: 2020-05-06 — End: 2020-05-06
  Filled 2020-05-06: qty 10

## 2020-05-06 MED ORDER — PROPOFOL 10 MG/ML IV BOLUS
INJECTION | Freq: Once | INTRAVENOUS | Status: DC | PRN
Start: 2020-05-06 — End: 2020-05-06
  Administered 2020-05-06: 150 mg via INTRAVENOUS
  Administered 2020-05-06: 30 mg via INTRAVENOUS

## 2020-05-06 MED ORDER — SERTRALINE 50 MG TABLET
50.00 mg | ORAL_TABLET | Freq: Every day | ORAL | Status: DC
Start: 2020-05-06 — End: 2020-05-07
  Administered 2020-05-06 – 2020-05-07 (×2): 50 mg via ORAL
  Filled 2020-05-06 (×2): qty 1

## 2020-05-06 MED ORDER — SUCCINYLCHOLINE CHLORIDE 20 MG/ML INJECTION SOLUTION
Freq: Once | INTRAMUSCULAR | Status: DC | PRN
Start: 2020-05-06 — End: 2020-05-06
  Administered 2020-05-06: 140 mg via INTRAVENOUS

## 2020-05-06 MED ORDER — SODIUM CHLORIDE 0.9 % (FLUSH) INJECTION SYRINGE
10.00 mL | INJECTION | Freq: Three times a day (TID) | INTRAMUSCULAR | Status: DC
Start: 2020-05-06 — End: 2020-05-07
  Administered 2020-05-06 – 2020-05-07 (×3): 10 mL via INTRAVENOUS

## 2020-05-06 MED ORDER — VANCOMYCIN 1,000 MG IV POWDER - FOR OR
Freq: Once | TOPICAL | Status: DC | PRN
Start: 2020-05-06 — End: 2020-05-06
  Administered 2020-05-06: 1 g

## 2020-05-06 MED ORDER — PROCHLORPERAZINE EDISYLATE 10 MG/2 ML (5 MG/ML) INJECTION SOLUTION
10.0000 mg | Freq: Once | INTRAMUSCULAR | Status: DC | PRN
Start: 2020-05-06 — End: 2020-05-06

## 2020-05-06 MED ORDER — ATORVASTATIN 20 MG TABLET
20.00 mg | ORAL_TABLET | Freq: Every evening | ORAL | Status: DC
Start: 2020-05-06 — End: 2020-05-07
  Administered 2020-05-06: 20 mg via ORAL
  Filled 2020-05-06: qty 1

## 2020-05-06 MED ORDER — DEXMEDETOMIDINE 4 MCG/ML IV DILUTION
Freq: Once | INTRAMUSCULAR | Status: DC | PRN
Start: 2020-05-06 — End: 2020-05-06
  Administered 2020-05-06: 10:00:00 8 ug via INTRAVENOUS
  Administered 2020-05-06 (×2): 4 ug via INTRAVENOUS
  Administered 2020-05-06: 08:00:00 12 ug via INTRAVENOUS
  Administered 2020-05-06: 10:00:00 4 ug via INTRAVENOUS
  Administered 2020-05-06: 08:00:00 8 ug via INTRAVENOUS

## 2020-05-06 MED ORDER — ROCURONIUM 10 MG/ML INTRAVENOUS SOLUTION
Freq: Once | INTRAVENOUS | Status: DC | PRN
Start: 2020-05-06 — End: 2020-05-06
  Administered 2020-05-06: 10 mg via INTRAVENOUS

## 2020-05-06 MED ORDER — HYDROMORPHONE 0.5 MG/0.5 ML INJECTION SYRINGE
0.5000 mg | INJECTION | INTRAMUSCULAR | Status: DC | PRN
Start: 2020-05-06 — End: 2020-05-06

## 2020-05-06 MED ORDER — HYDROMORPHONE 2 MG/ML INJECTION SOLUTION
Freq: Once | INTRAMUSCULAR | Status: DC | PRN
Start: 2020-05-06 — End: 2020-05-06
  Administered 2020-05-06: 1 mg via INTRAVENOUS

## 2020-05-06 MED ORDER — BUPIVACAINE LIPOSOME(PF) 1.3 %(13.3 MG/ML) SUSPENSION FOR INFILTRATION
Freq: Once | Status: DC | PRN
Start: 2020-05-06 — End: 2020-05-06
  Administered 2020-05-06: 20 mL

## 2020-05-06 MED ORDER — LIDOCAINE (PF) 100 MG/5 ML (2 %) INTRAVENOUS SYRINGE
INJECTION | Freq: Once | INTRAVENOUS | Status: DC | PRN
Start: 2020-05-06 — End: 2020-05-06
  Administered 2020-05-06: 60 mg via INTRAVENOUS

## 2020-05-06 MED ORDER — LISINOPRIL 10 MG TABLET
10.00 mg | ORAL_TABLET | Freq: Every day | ORAL | Status: DC
Start: 2020-05-06 — End: 2020-05-07
  Administered 2020-05-06: 0 mg via ORAL
  Administered 2020-05-07: 10 mg via ORAL
  Filled 2020-05-06 (×2): qty 1

## 2020-05-06 MED ORDER — BUPIVACAINE (PF) 0.5 % (5 MG/ML) INJECTION SOLUTION
Freq: Once | INTRAMUSCULAR | Status: DC | PRN
Start: 2020-05-06 — End: 2020-05-06
  Administered 2020-05-06: 20 mL

## 2020-05-06 MED ORDER — DEXAMETHASONE SODIUM PHOSPHATE 4 MG/ML INJECTION SOLUTION
Freq: Once | INTRAMUSCULAR | Status: DC | PRN
Start: 2020-05-06 — End: 2020-05-06
  Administered 2020-05-06: 4 mg via INTRAVENOUS

## 2020-05-06 MED ORDER — LACTATED RINGERS INTRAVENOUS SOLUTION
INTRAVENOUS | Status: DC | PRN
Start: 2020-05-06 — End: 2020-05-06
  Administered 2020-05-06: 0 mL via INTRAVENOUS

## 2020-05-06 MED ORDER — BUPIVACAINE (PF) 0.5 % (5 MG/ML) INJECTION SOLUTION
INTRAMUSCULAR | Status: AC
Start: 2020-05-06 — End: 2020-05-06
  Filled 2020-05-06: qty 20

## 2020-05-06 MED ORDER — LACTATED RINGERS INTRAVENOUS SOLUTION
INTRAVENOUS | Status: DC
Start: 2020-05-06 — End: 2020-05-07
  Administered 2020-05-07: 0 mL via INTRAVENOUS

## 2020-05-06 MED ORDER — SODIUM CHLORIDE 0.9 % IRRIGATION SOLUTION
Freq: Once | Status: DC | PRN
Start: 2020-05-06 — End: 2020-05-06
  Administered 2020-05-06: 80 mL
  Administered 2020-05-06: 590 mL

## 2020-05-06 MED ORDER — EPHEDRINE SULFATE 50 MG/ML INJECTION SOLUTION
Freq: Once | INTRAMUSCULAR | Status: DC | PRN
Start: 2020-05-06 — End: 2020-05-06
  Administered 2020-05-06: 10 mg via INTRAVENOUS

## 2020-05-06 MED ORDER — PROPOFOL 10 MG/ML IV - CHI
INTRAVENOUS | Status: DC | PRN
Start: 2020-05-06 — End: 2020-05-06
  Administered 2020-05-06: 150 ug/kg/min via INTRAVENOUS
  Administered 2020-05-06: 100 ug/kg/min via INTRAVENOUS
  Administered 2020-05-06 (×2): 125 ug/kg/min via INTRAVENOUS
  Administered 2020-05-06: 150 ug/kg/min via INTRAVENOUS

## 2020-05-06 MED ORDER — ACETAMINOPHEN 1,000 MG/100 ML (10 MG/ML) INTRAVENOUS SOLUTION
Freq: Once | INTRAVENOUS | Status: DC | PRN
Start: 2020-05-06 — End: 2020-05-06
  Administered 2020-05-06: 1000 mg via INTRAVENOUS

## 2020-05-06 SURGICAL SUPPLY — 58 items
APPL 72.5% ISPRP 0.83% PVP IOD 40ML PRVL FX PREP 1 STEP (SSOP) ×3
APPL 72.5% ISPRP 0.83% PVP IOD_40ML PRVL FX PREP 1 STEP (SSOP) ×3 IMPLANT
BANDAGE ESMARK 9FTX6IN ELAS SM TH FNSH COMP BLU STRL LF (WOUND CARE/ENTEROSTOMAL SUPPLY) ×1
BANDAGE ESMARK 9FTX6IN ELAS SM_TH FNSH COMP BLU STRL LF (WOUND CARE SUPPLY) ×1 IMPLANT
BASEPLATE TIB PERSONA 5D D KNEE LFT CEMENT STEM TIV STRL (JOINT COMMPONENT) ×1 IMPLANT
BLADE 10 BD RB-BCK CBNSTL SURG TISS STRL LF  DISP (SURGICAL CUTTING SUPPLIES) ×1 IMPLANT
BLADE SAW 100X25MM OSCLR COAT THK1.27MM (SBLD) ×1 IMPLANT
BONE CEM REV MX BRKWY NOZ SYSTEM LF (ORTHOPEDICS (NOT IMPLANTS)) ×1 IMPLANT
BONE CEM REV MX BRKWY NOZ SYST_EM LF (ORTHOPEDICS (NOT IMPLANTS)) ×1
CEMENT BONE BIOMET 40GM ×2 IMPLANT
CONV USE 405188 - CUFF TOURNIQUET BRN 34X4IN ATS 3K CYL 2 PORT 1 BLADDER POS LOCK CONN SLEEVE STRL LF  DISP (ORTHOPEDICS (NOT IMPLANTS)) ×1 IMPLANT
CONV USE ITEM 313924 - BANDAGE COFLX NL 5YDX6IN_EASYTEAR ADH CHSV PAT TCH STRN (WOUND CARE SUPPLY) ×1 IMPLANT
CONV USE ITEM 337902 - KIT SURG LRG STUP NONST DISP LF (CUSTOM TRAYS & PACK) ×1 IMPLANT
CONV USE ITEM 345941 - PLS LAV ORTHOPREP STKHS SUCT SYSTEM LF (Suction) ×1 IMPLANT
CONV USE ITEM 402177 - GOWN SURG XL L3 NONREINFORCE HKLP CLSR SET IN SLEEVE STRL LF  DISP BLU AURR FBRC 47IN (PROTECTIVE PRODUCTS/GARMENTS) ×1
COVER 53X24IN MAYOSTAND PRXM STRL DISP EQP SMS LF (DRAPE/PACKS/SHEETS/OR TOWEL) ×1 IMPLANT
CUFF TOURNIQUET BRN 34X4IN ATS 3K CYL 2 PORT 1 BLADDER POS LOCK CONN SLEEVE STRL LF  DISP (ORTHOPEDICS (NOT IMPLANTS)) ×1
CURETTE CEMENT BX/25 OR1201 (INSTRUMENTS) ×1
CURETTE SM 8X6MM NO-SCRCH WHITNEY CURT CEMENT SURG PLASTIC STRL DISP BLU (SURGICAL INSTRUMENTS) ×1 IMPLANT
DISCONTINUED USE 330324 - NEEDLE HYPO 22GA 1.5IN REG WL_SFGLD POLYPROP REG BVL LL HUB (NEEDLES & SYRINGE SUPPLIES) ×1 IMPLANT
DRAPE 2 INCS FILM ANTIMIC 23X17IN IOBN STRL SURG (PROTECTIVE PRODUCTS/GARMENTS) ×1 IMPLANT
DRAPE 2 INCS FILM ANTIMIC 23X1_7IN IOBN STRL SURG (PROTECTIVE PRODUCTS/GARMENTS) ×1
DRAPE ADH 51X47IN STRDRP LF  STRL DISP SURG CLR (PROTECTIVE PRODUCTS/GARMENTS) ×1 IMPLANT
DRAPE ADH 51X47IN U STRDRP LF STRL DISP SURG PLASTIC CLR (PROTECTIVE PRODUCTS/GARMENTS) ×1
DRAPE FNFLD ABS REINF 77X53IN 43528 PRXM LF  STRL DISP SURG SMS 44X23IN (PROTECTIVE PRODUCTS/GARMENTS) ×3 IMPLANT
DRAPE IMPRV SPLT ADH 76X54IN LF  STRL DISP SURG SMS 24X6IN (DRAPE/PACKS/SHEETS/OR TOWEL) ×1 IMPLANT
DRAPE MAYOSTAND CVR 53X24IN PR XM LF STRL DISP EQP SMS (DRAPE/PACKS/SHEETS/OR TOWEL) ×1
DRAPE REINF FNFLD 90X44IN LF  STRL DISP SURG (EQUIPMENT MINOR) ×1 IMPLANT
DRESS GRMCDL 14X3.5IN AQCL AG POLYUR IONIC SILVER CVR LYR WTPRF BCTR BARRIER ANTIMIC (WOUND CARE SUPPLY) ×1 IMPLANT
FEM 7 NRW COCR CRCTE RTN CEMENT PERSONA KNEE LFT COM (JOINT COMMPONENT) ×1 IMPLANT
FEM VIVACIT-E CEMENT SURF PERSONA KNEE PATEL TIB COM ×1 IMPLANT
GOWN SURG 2XL L4 REINF HKLP CLSR BRTHBL FILM SLEEVE STRL LF  DISP BLU SIRUS SMS PE 49IN (PROTECTIVE PRODUCTS/GARMENTS) ×1 IMPLANT
GOWN SURG 2XL L4 REINF HKLP CL_SR BRTHBL FILM SLEEVE STRL LF (PROTECTIVE PRODUCTS/GARMENTS) ×1
GOWN SURG XL L3 NONREINFORCE HKLP CLSR SET IN SLEEVE STRL LF  DISP BLU AURR FBRC 47IN (PROTECTIVE PRODUCTS/GARMENTS) ×1 IMPLANT
HOOD SRG FLYTE STRSHLD SURGICO OL FACE SHLD VIR BRR LTWT LF (PROTECTIVE PRODUCTS/GARMENTS) ×4
HOOD SRG FLYTE ZP PLWY LEN STRL LF  DISP (PROTECTIVE PRODUCTS/GARMENTS) ×4 IMPLANT
INTERPULSE HANDPIECE W/COAXIAL_0210110100 6EA/BX (MISCELLANEOUS PT CARE ITEMS) ×1
NEEDLE HYPO  18GA 1.5IN REG WL SFGLD POLYPROP REG BVL LL SHIELD MECH DEHP-FR IM PNK STRL LF  DISP (NEEDLES & SYRINGE SUPPLIES) ×1 IMPLANT
NEEDLE HYPO 22GA 1.5IN REG WL SFGLD POLYPROP REG BVL LL HUB (NEEDLES & SYRINGE SUPPLIES) ×1
PACK LRG SET UP DYNJ906636A CS/2 (CUSTOM TRAYS & PACK) ×1
PATEL 32MM VIVACIT-E ALPLY PERSONA KNEE COM STRL LF ×1 IMPLANT
PENCIL SMOKEVAC COAT PSHBTN LF (CAUTERY SUPPLIES) ×1 IMPLANT
PLS LAV ORTHOPREP STKHS SUCT SYSTEM LF (Suction) ×1 IMPLANT
Persona the Personalized Knee System Stem Extensio (JOINT COMMPONENT) IMPLANT
SET INTPLS SUCT TUBE COAX BONE CLEAN TIP HANDPC STRL LF  DISP (MISCELLANEOUS PT CARE ITEMS) ×1
SET INTPLS SUCT TUBE COAX BONE CLEAN TIP HANDPC STRL LF DISP (MISCELLANEOUS PT CARE ITEMS) ×1 IMPLANT
SOL IRRG 0.9% NACL 500ML PLASTIC PR BTL ISTNC N-PYRG STRL LF (SOLUTIONS) ×1 IMPLANT
STAPLER SKIN 35 CNT REG STPL CART ERG HNDL REM LF  APPOSE DISP CLR SS PLASTIC (ENDOSCOPIC SUPPLIES) ×1 IMPLANT
STKNT ORTHO 48X12IN IMPRV PUL TAB HOLLOW LF  STRL (ORTHOPEDICS (NOT IMPLANTS)) ×1 IMPLANT
SURFACE ARTIC 12MM PERSONA 3-7 C-D KNEE LFT VIVACIT-E UL CONG STRL LF ×1 IMPLANT
SURFACE ARTIC 12MM PERSONA 4-11 E-F KNEE LFT VIVACIT-E UL CONG STRL LF ×1 IMPLANT
SUTURE 1 CT1 VICRYL 27IN VIOL BRD COAT ABS (SUTURE/WOUND CLOSURE) ×6 IMPLANT
SUTURE 2 C-13 FIBERWIRE 38IN BLU BRD TIE MS LWR KNT PROF NONAB (SUTURE/WOUND CLOSURE) ×1 IMPLANT
SUTURE 2-0 CT1 VICRYL 27IN UNDYED BRD COAT ABS (SUTURE/WOUND CLOSURE) ×3 IMPLANT
SYRINGE LL 30ML LF  STRL CONCEN TIP GRAD N-PYRG DEHP-FR MED DISP (NEEDLES & SYRINGE SUPPLIES) ×1 IMPLANT
TRAY SKIN SCRUB 8IN VNYL COTTON 6 WNG 6 SPONGE STICK 2 TIP APPL DRY STRL LF (KITS & TRAYS (DISPOSABLE)) ×1 IMPLANT
TRAY SURG PREP SCR CR ESTM (KITS & TRAYS (DISPOSABLE)) ×1
WATER STRL 1000ML PLASTIC PR BTL LF (SOLUTIONS) ×2 IMPLANT

## 2020-05-06 NOTE — Brief Op Note (Signed)
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           Condition: stable    Patient is at increased risk for surgical bleeding : Yes - hold low dose unfractionated heparin/low molecular weight heparin    Patient is on postop antibiotic regimen > 24 hours:  Yes due to  prophylaxis for bone surgery    Beta Blocker:  As ordered    [name], DO

## 2020-05-06 NOTE — Anesthesia Transfer of Care (Signed)
ANESTHESIA TRANSFER OF CARE   Toni Tran is a 64 y.o. ,female, Weight: 102 kg (225 lb)   had Procedure(s):  ARTHROPLASTY KNEE TOTAL  performed  05/06/20   Primary Service: [name], DO    Past Medical History:   Diagnosis Date   . Anxiety    . 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 daily   . Chronic pain     right foot   . 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 Depression    . History of anesthesia complications     Required overnight stay after tarsal tunnel release d/t hypoxia per pt,    . Hypercholesterolemia    . Hyperlipidemia    . Hypertension    . MRSA infection    . Obesity    . Peripheral edema     right foot   . Peripheral neuropathy     Right foot s/p tarsal tunnel release   . Plantar fascial fibromatosis of right foot    . PONV (postoperative nausea and vomiting)     requests Anti Emetic   . RLS (restless legs syndrome)    . Snores     Never has had sleep study   . Tarsal tunnel syndrome, right    . Vitamin D deficiency    . Wears glasses       Allergy History as of 05/06/20      No Known Allergies              I completed my transfer of care / handoff to the receiving personnel during which we discussed:  Access, Airway, All key/critical aspects of case discussed, Analgesia, Antibiotics, Expectation of post procedure, Fluids/Product, Gave opportunity for questions and acknowledgement of understanding, Labs and PMHx    Post Location: PACU                                                                Last OR Temp: Temperature: 36.5 C (97.7 F)  ABG:  POTASSIUM   Date Value Ref Range Status   03/06/2020 4.1 3.4 - 5.1 mmol/L Final     KETONES   Date Value Ref Range Status   03/28/2020 Negative Negative mg/dL Final     CALCIUM   Date Value Ref Range Status   03/06/2020 9.3 8.8 - 10.2 mg/dL Final     Calculated P Axis   Date Value Ref Range Status   03/06/2020 56 degrees Final      Calculated R Axis   Date Value Ref Range Status   03/06/2020 -9 degrees Final     Calculated T Axis   Date Value Ref Range Status   03/06/2020 25 degrees Final     Airway:* No LDAs found *  Blood pressure (!) 114/56, pulse 93, temperature 36.5 C (97.7 F), resp. rate 13, height 1.575 m (5\' 2" ), weight 102 kg (225 lb), SpO2 93 %, not currently breastfeeding.

## 2020-05-06 NOTE — H&P (Signed)
[facility]  [place], [place] [address]    H&P Update Form                                                Toni Tran, Toni Tran, 64 y.o. female  Date of Admission:  05/06/2020  Date of Birth:  [birth year]    05/06/2020    STOP: IF H&P IS GREATER THAN 30 DAYS FROM SURGICAL DAY COMPLETE NEW H&P IS REQUIRED.    Outpatient Pre-Surgical H & P updated the day of the procedure.  1.  H&P completed within 30 days of surgical procedure and  has been reviewed within 24 hours     of the surgery, the patient has been examined, and no change has occured in the patients condition since the H&P was completed.       Change in medications: No      Comments:     2.  Patient continues to be appropiate candidate for planned surgical procedure. YES    [name], DO  05/06/2020, 07:41

## 2020-05-06 NOTE — Nurses Notes (Signed)
Report given to nurse on floor.  Exparel band information given. Discharge instructions for exparel added to patient education.

## 2020-05-06 NOTE — Anesthesia Preprocedure Evaluation (Signed)
ANESTHESIA PRE-OP EVALUATION  Planned Procedure: ARTHROPLASTY KNEE TOTAL (Left Knee)  Review of Systems    PONV                 Pulmonary     Cardiovascular    ECG reviewed ,       GI/Hepatic/Renal        Endo/Other          Neuro/Psych/MS        Cancer                     Physical Assessment      Airway       Mallampati: IV      Neck ROM: full  Mouth Opening: good.            Dental           (+) poor dentition           Pulmonary    Breath sounds clear to auscultation       Cardiovascular    Rhythm: regular  Rate: Normal       Other findings            Plan  ASA 4     Planned anesthesia type: spinal                    Anesthetic plan and risks discussed with patient.          Patient's NPO status is appropriate for Anesthesia.

## 2020-05-06 NOTE — Nurses Notes (Signed)
Pt has two swabs for MRSA pre operative the first was positive and this RN verified with pt that she was indeed treated and then the second swab was negative.  Per ID, [facility] Mask pt would not need isolation.

## 2020-05-06 NOTE — OR Surgeon (Signed)
PATIENT NAME: Tran Tran NUMBER:  [id number]  DATE OF SERVICE: 05/06/2020  DATE OF BIRTH:  Mar 19, 1956    OPERATIVE REPORT    PREOPERATIVE DIAGNOSIS:  Left knee osteoarthritis, ACL deficiency, posterior medial meniscus root tear.    POSTOPERATIVE DIAGNOSIS:  Left knee osteoarthritis, ACL deficiency, posterior medial meniscus root tear.    NAME OF PROCEDURE:  Left total knee arthroplasty.    ANESTHESIA:  Attempted spinal, general, Exparel local.    SURGEON:  Priscille Kluver, DO.    ASSISTANT:  Mauri Pole, NP   Freddi Che, SA-C.    ESTIMATED BLOOD LOSS:  About 20 mL.    IMPLANTS:  Zimmer Persona size 7, narrow femur, size D tibia with a long extension due to soft bone, a 12 mm E poly ultracongruent bearing and a 32 three-pegged patella.  All cemented with Palacos.    INDICATIONS FOR PROCEDURE:  Tran Tran is 64 years of age.  She has had significant pain and dysfunction of her knee.  MRIs and x-rays identified degenerative wear,  medial defect and ACL deficiency as well as a root tear.  The options for care were outlined.  She did not have a good conservative option for this.  She would like to go forward with a left total knee arthroplasty.  She understood the nature of the procedure, options, risks, benefits.  These were outlined.  She must be committed to a postoperative recovery protocol.  Consent was obtained.    DESCRIPTION OF PROCEDURE:  This patient was brought to the operative suite after having satisfied laboratory and clinical criteria.  The patient was then placed on the operative table and administered a general anesthesia.  The appropriate limb was elevated and the tourniquet was placed around the distal thigh. Thorough prepping and draping were accomplished.  A full time-out procedure was undertaken including the identification, appropriate instrumentation available, and antibiotics infused.      The limb was exsanguinated. The tourniquet was inflated. A midline incision was made and small  bleeders electrocoagulated as they were encountered. The extensor mechanism was exposed and a medial parapatellar incision was made. Medial exposure of the proximal tibia was done. Removal of fat pad, anterior cruciate ligament, and menisci was done. Portions of the synovium off the anterior femur was completed. Osteophytes were removed. Appropriate retracting was gained as we then placed a proximal tibial cutting jig for removal of 2 mm off the medial side. Full-thickness bone was removed. Attention was taken to the femur where the intramedullary access was gained. A 5-degree valgus jig was placed and a standard resection was accomplished off the distal femur. The femur was sized and checked with an Angel wing. The 4-in-1 cutting block was placed with the appropriate orientation. Additional meniscal tissue was removed using a laminar spreader medially and laterally. A small rongeur was used for removal of additional osteophytes.     The tibial component was sized for best fit and this was screwed in place before trial. The femoral component was placed. The knee articulated nicely with a standard 12 mm ultracongruent poly. These trial components were removed. Attention was taken to the patella, which was then resected using a capture jig. Measurement pre and post cuts were equivalent. The appropriate 32 mm tri-peg poly fit nicely.     Thorough irrigation was accomplished. The bony surfaces were dried using suction and a lap pad. Cementation was accomplished and the cement over the tibial surface was pressed into place using a wide  osteotome. The tibial component was positioned and impacted.  The 12 mm ultracongruent polyethylene tibial bearing was placed and locked into position.  The femoral component was also placed over dried bone and cement and then impacted. The patella was clamped for compression over cement. The knee was held in extension as the cement was allowed to fully cure.     There was nice stability  and range of motion in full flexion and extension. The periarticular tissues were injected with local anesthesia using Exparel, saline, and 0.25% bupivacaine; 130 mL were used. Vancomycin powder was sprinkled in the joint and around the joint.     The extensor mechanism was closed with numerous 2-0 FiberWires centrally as well as 1-0 Vicryl superiorly and inferiorly.  With this accomplished, subcutaneous and deep tissues were closed using 2-0 Vicryl.  Staples were then applied.  An Aquacel dressing was placed. The drapes and tourniquet were removed.  The anesthesia was reversed. The  patient was taken to the PACU after having tolerated the procedure well.  Sponge and needle counts were correct.        Tran Level, DO                DD:  05/06/2020 10:14:38  DT:  05/06/2020 10:49:27 BW  D#:  [id number]

## 2020-05-06 NOTE — OT Evaluation (Signed)
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 Hypertension   . Hyperlipidemia   . Vitamin D deficiency   . CVA (cerebrovascular accident) (CMS HCC)   . Anxiety   . Depression   . Obesity   . Plantar fascial fibromatosis of right foot   . Tarsal tunnel syndrome of right side   . Plantar fasciitis of right foot   . Chest pain, pleuritic   . Acute bronchitis   . Left knee DJD   . Morbid obesity with BMI of 40.0-44.9, adult (CMS HCC)       Toni Tran is a 64 y.o., female, admitted s/p L TKA    Past Medical Hx:    Past Medical History:   Diagnosis Date   . Anxiety    . 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 daily   . Chronic pain     right foot   . 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 Depression    . History of anesthesia complications     Required overnight stay after tarsal tunnel release d/t hypoxia per pt,    . Hypercholesterolemia    . Hyperlipidemia    . Hypertension    . MRSA infection    . Obesity    . Peripheral edema     right foot   . Peripheral neuropathy     Right foot s/p tarsal tunnel release   . Plantar fascial fibromatosis of right foot    . PONV (postoperative nausea and vomiting)     requests Anti Emetic   . RLS (restless legs syndrome)    . Snores     Never has had sleep study   . Tarsal tunnel syndrome, right    . Vitamin D deficiency    . Wears glasses          Past Surgical Hx:    Past Surgical History:   Procedure Laterality Date   . HX APPENDECTOMY  childhood    . HX EYE SURGERY Bilateral Childhood    Muscle Surgery   . HX FOOT SURGERY Right 06/07/2017    Tarsal Tunnel release; FASCIOTOMY PLANTAR   . HX SHOULDER SURGERY Right 06/2016    Exc. Bone Spur, Repair Rotator Cuff Tear   . HX TUBAL LIGATION  1992         Past Social Hx:       Social History     Social History Narrative   . Not on file        [phone]   History Minimal review of history related to current functional performance Expanded review of physical, cognitive or psychosocial history related to current functional performance Extensive review of physical, cognitive or psychosocial history related to current functional performance   Examination of Body Systems Addressing 1-3 performance deficits Addressing a total of 3-5 performance deficits Addressing  a total of 5 or more performance deficits   Clinical Presentation Modification of tasks not necessary to complete the evaluation Min-moderate modification of tasks or assistance necessary to complete the evaluation Significant modification of tasks or assistance necessary to complete the evaluation   Typical Face-to-Face Time (minutes) 30 45 60   Clinical Decision Making (Complexity) Low Moderate High     Subjective/Objective:     SUBJECTIVE:  Pt stated, "I haven't been able to pee yet"     Pain Scale (0-10):  10/10    PRECAUTIONS:  Fall risk    PRIOR LEVEL OF FUNCTION:  Pt lives with SO and grandchildren in a basement apartment w/ ~12 STE. Pt was Ind with mobility without the use of AE and was I with ADLs.     COMMUNICATION SKILLS:  Able to make needs known    COGNITION:     Memory:  Alert and oriented x 4. Intact STM & LTM     Follows Directions:   Follows 2-step commands      Safety Awareness:  Aware of safety concerns    ADL SKILLS:     UE Bathing: Independent  UE Dressing Independent  LE Bathing: Mod assist  LE Dressing: Mod assist  Hygiene: Independent      Eating:  Independent    LEISURE INTERESTS / SKILLS:  Enjoys TV    ADAPTIVE EQUIPMENT:  None     FUNCTIONAL MOBILITY: Pt CGA for bed mobility. Able to stand and ambulate to bathroom w/ RW and CGA. Pt voided and completed toileting w/ CGA. Pt then ambulated back to bed and transferred to supine w/ CGA.     BALANCE:   Sitting:  Static: Good   Dynamic:  Good      Standing: Static: Good      Dynamic:  Fair     ROM:   RUE:  WFL   LUE:  WFL     STRENGTH:   RUE:   4/5   LUE: 4/5    COORDINATION:    Fine Motor:  RUE: Good  LUE:  Good     Gross Motor:  RUE: Good  LUE:   Good      ENDURANCE / ACTIVITY TOLERANCE: Fair    VISUAL / PERCEPTUAL SKILLS:  Intact    SENSATION:     Intact to light touch     PROPRIOCEPTION:  Intact                           Assessment:   Pt is a 64 yo female admitted s/p L TKA. Pt was pleasant and cooperative. She was alert and oriented. Pt was able to follow all commands. Pt CGA for bed mobility. Able to stand and ambulate to bathroom w/ RW and CGA. Pt voided and completed toileting w/ CGA. Pt then ambulated back to bed and transferred to supine w/ CGA. Pt will benefit from OT for ADL retraining with ADs, strength/end. BUE, mobility retraining, and home safety tech's. Recommend discharge to home w/ HH.    Goals:   Short Term Goals:  1. Pt. to be SBAx1 with UE/LE ADLs/IADLs.  2. Pt. to be SBAx1 with functional mobility with walker.  3. Pt. to observe home safety techniques 100% of time.  4. Pt. to be independent with HEP for BUE strength/endurance.  5. Pt./Family to have good understanding AD use and DME needs.    Patient and/or family Goals/Expectations:  "To go  home." : Realistic    Plan:     TREATMENT PLAN/FREQUENCY:  O.T. 3-5 times per week for ADL retraining, mobility retraining, strength/end. BUE, and home safety techniques.    D/C PLANS:  Recommend discharge to home with home health services.    OT Recommendations for Nursing:  Encourage patient to ambulate to bathroom for toileting  OT Recommendations for Patient/Family:  Same as above    Follow patient as appropriate according to  established plan of care, progressing as tolerated.     The risks/benefits of therapy have been discussed with the patient and he/she is in agreement with the established plan of care.     Therapist :     Delford Field, OTR/L  05/06/2020, 14:40    Time may include review of chart notes, obtaining patient's functional history from patient/family/medical staff/case management/ancillary personnel, collaboration on findings and treatment options (with the above mentioned individuals), re-assessment, and acute care rehabilitation.

## 2020-05-06 NOTE — PT Evaluation (Signed)
[facility]  [place], [place] [address]  Rehabilitation Services  Physical Therapy Initial Evaluation        Patient Name: Toni Tran  Date of Birth: 08-20-1956  Height: 157.5 cm (5\' 2" )  Weight: 102 kg (225 lb)  Room/Bed: 436/A  Payor: [place] MEDICARE / Plan: [place] MEDICARE DUAL COMPLETE / Product Type: MEDICARE [facility] /     Encounter Start Date:  05/06/2020  Inpatient Admission Date:     Patient Active Problem List   Diagnosis   . Hypertension   . Hyperlipidemia   . Vitamin D deficiency   . CVA (cerebrovascular accident) (CMS HCC)   . Anxiety   . Depression   . Obesity   . Plantar fascial fibromatosis of right foot   . Tarsal tunnel syndrome of right side   . Plantar fasciitis of right foot   . Chest pain, pleuritic   . Acute bronchitis   . Left knee DJD   . Morbid obesity with BMI of 40.0-44.9, adult (CMS HCC)       Toni Tran is a 64 y.o., female, admitted for L knee TKA     Past Medical Hx:    Past Medical History:   Diagnosis Date   . Anxiety    . 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 daily   . Chronic pain     right foot   . 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 Depression    . History of anesthesia complications     Required overnight stay after tarsal tunnel release d/t hypoxia per pt,    . Hypercholesterolemia    . Hyperlipidemia    . Hypertension    . MRSA infection    . Obesity    . Peripheral edema     right foot   . Peripheral neuropathy     Right foot s/p tarsal tunnel release   . Plantar fascial fibromatosis of right foot    . PONV (postoperative nausea and vomiting)     requests Anti Emetic   . RLS (restless legs syndrome)    . Snores     Never has had sleep study   . Tarsal tunnel syndrome, right    . Vitamin D deficiency    . Wears glasses      Past Surgical Hx:    Past Surgical History:   Procedure Laterality Date   . HX APPENDECTOMY  childhood   . HX EYE SURGERY Bilateral  Childhood    Muscle Surgery   . HX FOOT SURGERY Right 06/07/2017    Tarsal Tunnel release; FASCIOTOMY PLANTAR   . HX SHOULDER SURGERY Right 06/2016    Exc. Bone Spur, Repair Rotator Cuff Tear   . HX TUBAL LIGATION  1992     Past Social Hx:       Social History     Social History Narrative   . Not on file     Patient Information     Precautions:  Falls      Barriers to learning:  None noted       Weight bearing status;      Location:  Left lower extremity      Weight bearing status:  Touch down weight bearing      Allowed weight:  100%    Prior level of function     Usual  living arrangement:With family     Type of dwellling:  Apartment     Physical barriers in the home environment:  Greater than 4 stairs and Railing ascending on right     Previously independent with:  Mobility/ambulation and Community ambulation     Comments:  Pt lives with her SO and grandchildren in a basement apartment with a full flight of steps down to with a railing.  Prior to admission pt was ambulating independently without use of an assistive device.      Subjective:     Numeric pain scale:  10out of 10     Comments:  L knee     Objective:     Cognition:  Person, Place, Time and Situation     Communication ability:  good     Range of Motion     RUE ROM WNL?  yes     LUE ROM WNL?  yes     RLE ROM WNL?  yes     LLE ROM WNL?  yes     RUE strength WNL?  yes     LUE strength WNL?yes     RLE strength WNL?  yes     LLE strength WNL?  yes      Functional Ability     Assistive devices used with bed mobility:  Bed rail and Mechanical controls     Rolling:  Contact guard assist     Supine - Sit:  Contact guard assist     Sit - Supine:  Minimum assist, 1 person assist and Requires assist with lower extremities     Sitting balance:  Good      Sit - stand:  Contact guard assist     Stand balance:  Normal    Ambulation     Ambulation surface:  Level and In patient's room     Ambulation ability:  Contact guard assist     Assistive devices:  Rolling walker      Ambulation tolerance:  Good     Comments: Pt ambulated 20' x 2 in room with RW and CGA of 1.  Gait was slow and steady, moderately stiff and mildly flexed posture     Stairs    Comments:  Not assessed at this time.       [id number] [phone]   History No personal factors or co-morbidities 1-2 personal factors or co-morbidities 3 or more personal factors or co-morbidities   Examination of Body Systems Addressing 1-2 elements Addressing a total of 3 or more elements Addressing a total of 4 or more elements   Clinical Presentation Stable Evolving Unstable   Typical Face-to-Face Time (minutes) 20 30 45   Clinical Decision Making (Complexity) Low Moderate High     Assessment     Assessment:  Pt is a 64 y/o female who presents s/p L knee TKA.  She will benefit from PT services to address her functional limitations.  Pt would like to return home with [facility] PT before transition to OP PT.     Rehab potential:  good    Goals:     Learning goals:  Gait, Precaution/Safety, Balance activities and Transfers/mobility     Physical therapy goals: 05/11/20  1.  Supine to seated at EOB with supervision  2.  Sit to stand for mEOB with supervision  3.  Pt will ambulate greater than 50' with RW and supervision  4.  Pt up and down a flight of stairs with hand rail  and CGA of 1.  Patient and/or family Goals/Expectations:  Home : Realistic    Plan:       Patient to be seen: PT 2 times per day     PT to see patient EHU:DJSH training, Transfer training, Balance training, Therapeutic exercises and Stair training    PT Recommendations for Nursing:  Transfer and ambulate with staff assist, OOB prn  PT Recommendations for Patient/Family: Encourage pt to assist with care as much as possible    Additional information:     Anticipated rehab needs at discharge:  Home health services     Patient has been advised of PT diagnosis, goals and plan:  Yes     Patient/family has given verbal consent for eval, treatment: and plan of care Yes    Is the patient  appropriate for skilled PT at this time?  yes         Hillard Danker, PT

## 2020-05-06 NOTE — Nurses Notes (Signed)
Notified Dr. Amin with medical consult.

## 2020-05-06 NOTE — Consults (Signed)
[facility]  [place], [place] [address]    Medicine Consult    Georgiann Cocker  Date of Admission:  05/06/2020  Date of Birth:  1955/12/31    PCP: [name], MD  Chief Complaint:  Left knee pain  Reason for consult-medical management       HPI: Toni Tran is a 64 y.o., White female with medical history significant for morbid obesity, CVA, hypertension, restless leg syndrome and osteoarthritis who is admitted under Orthopedic surgery service for elective left total knee replacement for indication of osteoarthritis.  Hospitalist consulted for medical management.  At the time of examination patient was postop and working with physical therapy to get out of bed.    Active Hospital Problems   (*Primary Problem)    Diagnosis   . Left knee DJD   . Morbid obesity with BMI of 40.0-44.9, adult (CMS [facility])       Past Medical History:   Diagnosis Date   . Anxiety    . 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 daily   . Chronic pain     right foot   . 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 Depression    . History of anesthesia complications     Required overnight stay after tarsal tunnel release d/t hypoxia per pt,    . Hypercholesterolemia    . Hyperlipidemia    . Hypertension    . MRSA infection    . Obesity    . Peripheral edema     right foot   . Peripheral neuropathy     Right foot s/p tarsal tunnel release   . Plantar fascial fibromatosis of right foot    . PONV (postoperative nausea and vomiting)     requests Anti Emetic   . RLS (restless legs syndrome)    . Snores     Never has had sleep study   . Tarsal tunnel syndrome, right    . Vitamin D deficiency    . Wears glasses        Past Surgical History:   Procedure Laterality Date   . HX APPENDECTOMY  childhood   . HX EYE SURGERY Bilateral Childhood    Muscle Surgery   . HX FOOT SURGERY Right 06/07/2017    Tarsal Tunnel release;  FASCIOTOMY PLANTAR   . HX SHOULDER SURGERY Right 06/2016    Exc. Bone Spur, Repair Rotator Cuff Tear   . HX TUBAL LIGATION  1992           Medications Prior to Admission     Prescriptions    albuterol sulfate (PROAIR HFA) 90 mcg/actuation Inhalation HFA Aerosol Inhaler    Take 1-2 Puffs by inhalation Every 6 hours as needed W/ spacer    amLODIPine (NORVASC) 10 mg Oral Tablet    Take 1 Tablet (10 mg total) by mouth Once a day AMLODIPINE BESYLATE    Ibuprofen (MOTRIN) 800 mg Oral Tablet    Take 1 Tab (800 mg total) by mouth Twice per day as needed for Pain    lisinopriL-hydrochlorothiazide (ZESTORETIC) 20-12.5 mg Oral Tablet    Take 1 Tablet by mouth Twice daily    mupirocin (BACTROBAN) 2 % Ointment    by Apply Topically route Three times a day    Patient not taking:  Reported on 05/06/2020    sertraline (  ZOLOFT) 50 mg Oral Tablet    Take 1 Tab (50 mg total) by mouth Once a day    Patient not taking:  Reported on 04/28/2020    simvastatin (ZOCOR) 20 mg Oral Tablet    Take 1 Tab (20 mg total) by mouth Every evening    traZODone (DESYREL) 150 mg Oral Tablet    TAKE 1 TABLET(150 MG) BY MOUTH EVERY NIGHT    VENTOLIN HFA 90 mcg/actuation Inhalation HFA Aerosol Inhaler    Take 2 Puffs by inhalation Every 6 hours as needed        aluminum-magnesium hydroxide-simethicone (MAG-AL PLUS) 200-200-20 mg per 5 mL oral liquid, 30 mL, Oral, Q6H PRN  aspirin tablet 325 mg, 325 mg, Oral, Q12H  bupivacaine liposomal (PF) (EXPAREL) 266 mg/20 mL local injection, 20 mL, Local Infiltration, Once  ceFAZolin (ANCEF) 2 g in iso-osmotic 100 mL premix IVPB, 2 g, Intravenous, Q8H  docusate sodium (COLACE) capsule, 100 mg, Oral, Daily  HYDROcodone-acetaminophen (NORCO) 7.5 mg-325 mg per tablet, 1 Tablet, Oral, Q4H PRN  HYDROcodone-acetaminophen (NORCO) 7.5 mg-325 mg per tablet, 2 Tablet, Oral, Q6H PRN  HYDROmorphone (DILAUDID) 1 mg/mL injection, 1 mg, Intravenous, Q2H PRN  LR premix infusion, , Intravenous, Continuous  LR premix infusion, ,  Intravenous, Continuous  NS 250 mL flush bag, , Intravenous, Q1H PRN  NS flush syringe, 10 mL, Intravenous, Q8HRS  NS flush syringe, 10 mL, Intravenous, Q1H PRN        No Known Allergies    Social History     Tobacco Use   . Smoking status: Never Smoker   . Smokeless tobacco: Never Used   Substance Use Topics   . Alcohol use: No       Family Medical History:     Problem Relation (Age of Onset)    Alzheimer's/Dementia Mother, Father    Arthritis-osteo Mother, Father    Blood Clots Father    Congestive Heart Failure Father    Coronary Artery Disease Father    Diabetes Father    Heart Attack Mother, Father    High Cholesterol Mother, Father    Hypertension (High Blood Pressure) Mother, Father    Migraines Mother, Father    Sleep disorders Father    Stroke Mother, Father        ROS: Other than ROS in the HPI, all other systems were negative.    DNR Status:  Full Code    EXAM:  Temperature: 36.4 C (97.5 F)  Heart Rate: 90  BP (Non-Invasive): (!) 105/51  Respiratory Rate: 18  SpO2: 92 %  Pain Score (Numeric, Faces): 8  General: appears morbidly obese. No distress.   Eyes: Pupils equal and round, reactive to light and accomodation.   HEENT: Head atraumatic and normocephalic   Neck: No JVD or thyromegaly or lymphadenopathy   Lungs: Clear to auscultation bilaterally.   Cardiovascular: regular rate and rhythm, S1, S2 normal, no murmur  Abdomen: Soft, non-tender, Bowel sounds normal, No hepatosplenomegaly   Extremities:  Status post left total knee replacement.  Limited mobility   Skin: Skin warm and dry   Neurologic: Grossly normal   Lymphatics: No lymphadenopathy   Psychiatric: Normal affect, behavior,     Labs:    I have reviewed all lab results.    Imaging Studies:      DVT RISK FACTORS HAVE BEN ASSESSED AND PROPHYLAXIS ORDERED (SEE RUBYONLINE - REFERENCE TOOLS - MD, DVT PROPHY OR POCKET CARD)    Assessment/recommendation:     1-status  post left total knee replacement  Postoperative care per Orthopedic surgery  Norco and  Dilaudid as needed for pain  Physical therapy evaluation per Orthopedic surgery    2-history of hypertension  Home dose of lisinopril and Norvasc restarted  Monitor blood pressure and adjust medication as needed    3-previous history of CVA  Continue statin.     4-history of morbid obesity/osteoarthritis/restless leg syndrome    Thank you for consulting hospitalist service, will follow patient along with you      DVT prophylaxis-per orthopedic surgeon  Code status. Full code.    Portions of this note may be dictated using voice recognition software or a dictation service. Variances in spelling and vocabulary are possible and unintentional. Not all errors are caught/corrected. Please notify the [facility] if any discrepancies are noted or if the meaning of any statement is not clear.

## 2020-05-06 NOTE — Care Plan (Signed)
Assessment     Assessment:  Pt is a 64 y/o female who presents s/p L knee TKA.  She will benefit from PT services to address her functional limitations.  Pt would like to return home with [facility] PT before transition to OP PT.     Rehab potential:  good    Goals:     Learning goals:  Gait, Precaution/Safety, Balance activities and Transfers/mobility     Physical therapy goals: 05/11/20  1.  Supine to seated at EOB with supervision  2.  Sit to stand for mEOB with supervision  3.  Pt will ambulate greater than 50' with RW and supervision  4.  Pt up and down a flight of stairs with hand rail and CGA of 1.  Patient and/or family Goals/Expectations:  Home : Realistic    Plan:       Patient to be seen: PT 2 times per day     PT to see patient GGY:IRSW training, Transfer training, Balance training, Therapeutic exercises and Stair training    PT Recommendations for Nursing:  Transfer and ambulate with staff assist, OOB prn  PT Recommendations for Patient/Family: Encourage pt to assist with care as much as possible    Additional information:     Anticipated rehab needs at discharge:  Home health services     Patient has been advised of PT diagnosis, goals and plan:  Yes     Patient/family has given verbal consent for eval, treatment: and plan of care Yes    Is the patient appropriate for skilled PT at this time?  yes         Hillard Danker, PT

## 2020-05-06 NOTE — OR PreOp (Signed)
Pre-operative temperature log completed by patient. Log reviewed by pre-operative nurse and log to be shared with operative team.

## 2020-05-06 NOTE — Care Plan (Signed)
Assessment:   Pt is a 64 yo female admitted s/p L TKA. Pt was pleasant and cooperative. She was alert and oriented. Pt was able to follow all commands. Pt CGA for bed mobility. Able to stand and ambulate to bathroom w/ RW and CGA. Pt voided and completed toileting w/ CGA. Pt then ambulated back to bed and transferred to supine w/ CGA. Pt will benefit from OT for ADL retraining with ADs, strength/end. BUE, mobility retraining, and home safety tech's. Recommend discharge to home w/ HH.    Goals:   Short Term Goals:  1. Pt. to be SBAx1 with UE/LE ADLs/IADLs.  2. Pt. to be SBAx1 with functional mobility with walker.  3. Pt. to observe home safety techniques 100% of time.  4. Pt. to be independent with HEP for BUE strength/endurance.  5. Pt./Family to have good understanding AD use and DME needs.    Patient and/or family Goals/Expectations:  "To go home." : Realistic    Plan:     TREATMENT PLAN/FREQUENCY:  O.T. 3-5 times per week for ADL retraining, mobility retraining, strength/end. BUE, and home safety techniques.    D/C PLANS:  Recommend discharge to home with home health services.    OT Recommendations for Nursing:  Encourage patient to ambulate to bathroom for toileting  OT Recommendations for Patient/Family:  Same as above    Follow patient as appropriate according to established plan of care, progressing as tolerated.     The risks/benefits of therapy have been discussed with the patient and he/she is in agreement with the established plan of care.     Therapist :     Delford Field, OTR/L  05/06/2020, 14:40    Time may include review of chart notes, obtaining patient's functional history from patient/family/medical staff/case management/ancillary personnel, collaboration on findings and treatment options (with the above mentioned individuals), re-assessment, and acute care rehabilitation.

## 2020-05-06 NOTE — OR PreOp (Signed)
RN can lock up belongings while in surgery, patient understands any missing belongings are patients responsibility. Patient instructed to please send any valuable items with family/visitor to ensure no lost or stolen items.

## 2020-05-06 NOTE — Nurses Notes (Signed)
Exparel band applied.  Pt asleep at this time.

## 2020-05-07 LAB — CBC W/AUTO DIFF
BASOPHIL #: 0 10*3/uL (ref 0.00–0.10)
BASOPHIL %: 0 % (ref 0–3)
EOSINOPHIL #: 0 10*3/uL (ref 0.00–0.50)
EOSINOPHIL %: 0 % (ref 0–5)
HCT: 29.3 % — ABNORMAL LOW (ref 36.0–45.0)
HGB: 10.5 g/dL — ABNORMAL LOW (ref 12.0–15.5)
LYMPHOCYTE #: 1.2 10*3/uL (ref 1.00–4.80)
LYMPHOCYTE %: 7 % — ABNORMAL LOW (ref 15–43)
MCH: 34.3 pg — ABNORMAL HIGH (ref 27.5–33.2)
MCHC: 35.8 g/dL (ref 32.0–36.0)
MCV: 95.7 fL (ref 82.0–97.0)
MONOCYTE #: 1.5 10*3/uL — ABNORMAL HIGH (ref 0.20–0.90)
MONOCYTE %: 9 % (ref 5–12)
MPV: 9.6 fL (ref 7.4–10.5)
NEUTROPHIL #: 13.3 10*3/uL — ABNORMAL HIGH (ref 1.50–6.50)
NEUTROPHIL %: 83 % — ABNORMAL HIGH (ref 43–76)
PLATELETS: 155 10*3/uL (ref 150–450)
RBC: 3.06 10*6/uL — ABNORMAL LOW (ref 4.00–5.10)
RDW: 12.4 % (ref 11.0–16.0)
WBC: 16 10*3/uL — ABNORMAL HIGH (ref 4.0–11.0)

## 2020-05-07 LAB — BASIC METABOLIC PANEL
ANION GAP: 7 mmol/L (ref 4–13)
BUN/CREA RATIO: 19 (ref 6–22)
BUN: 15 mg/dL (ref 8–25)
CALCIUM: 8.8 mg/dL (ref 8.8–10.2)
CHLORIDE: 100 mmol/L (ref 96–111)
CO2 TOTAL: 28 mmol/L (ref 23–31)
CREATININE: 0.77 mg/dL (ref 0.60–1.05)
ESTIMATED GFR: 82 mL/min/BSA (ref >=60)
GLUCOSE: 140 mg/dL — ABNORMAL HIGH (ref 65–125)
POTASSIUM: 3.9 mmol/L (ref 3.5–5.1)
SODIUM: 135 mmol/L — ABNORMAL LOW (ref 136–145)

## 2020-05-07 LAB — MAGNESIUM: MAGNESIUM: 1.6 mg/dL — ABNORMAL LOW (ref 1.8–2.6)

## 2020-05-07 MED ORDER — HYDROCODONE 7.5 MG-ACETAMINOPHEN 325 MG TABLET
2.00 | ORAL_TABLET | Freq: Four times a day (QID) | ORAL | 0 refills | Status: DC | PRN
Start: 2020-05-07 — End: 2021-09-15

## 2020-05-07 MED ORDER — ASPIRIN 325 MG TABLET
325.00 mg | ORAL_TABLET | Freq: Two times a day (BID) | ORAL | 0 refills | Status: AC
Start: 2020-05-07 — End: 2020-06-06

## 2020-05-07 MED ORDER — DOCUSATE SODIUM 100 MG CAPSULE
100.00 mg | ORAL_CAPSULE | Freq: Every day | ORAL | 0 refills | Status: AC
Start: 2020-05-08 — End: 2020-05-22

## 2020-05-07 NOTE — Anesthesia Postprocedure Evaluation (Signed)
Anesthesia Post Op Evaluation    Patient: Toni Tran  Procedure(s):  ARTHROPLASTY KNEE TOTAL    Last Vitals:Temperature: 36.6 C (97.8 F) (05/07/20 0746)  Heart Rate: 90 (05/07/20 0746)  BP (Non-Invasive): 137/61 (05/07/20 0746)  Respiratory Rate: 20 (05/07/20 1000)  SpO2: 95 % (05/07/20 0746)    No complications documented.      Patient location during evaluation: bedside       Patient participation: complete - patient participated  Level of consciousness: awake and alert    Pain management: satisfactory to patient  Airway patency: patent    Anesthetic complications: no  Cardiovascular status: acceptable  Respiratory status: acceptable  Hydration status: acceptable    PONV Status: Absent

## 2020-05-07 NOTE — Discharge Instructions (Signed)
Weight bearing as tolerated    Arrangements have been made for you to receive a walker from [employer].  This should be delivered to you home this evening.  Please contact them at [phone] if there are any issues.    Arrangements have been made for home health PT with [facility].  A physical therapist will contact you for a home visit tomorrow, May 27th.  You can contacted [facility] by calling [phone].

## 2020-05-07 NOTE — Care Plan (Signed)
Problem: Adult Inpatient Plan of Care  Goal: Plan of Care Review  Outcome: Adequate for Discharge  Goal: Patient-Specific Goal (Individualized)  Outcome: Adequate for Discharge  Goal: Absence of Hospital-Acquired Illness or Injury  Outcome: Adequate for Discharge  Intervention: Identify and Manage Fall Risk  Flowsheets (Taken 05/07/2020 1354)  Safety Promotion/Fall Prevention:   activity supervised   fall prevention program maintained   nonskid shoes/slippers when out of bed   safety round/check completed  Intervention: Prevent Skin Injury  Flowsheets (Taken 05/07/2020 1354)  Body Position:   positioned/repositioned independently   weight shift assistance provided  Intervention: Prevent and Manage VTE (Venous Thromboembolism) Risk  Flowsheets (Taken 05/07/2020 1354)  VTE Prevention/Management:   ambulation promoted   anticoagulant therapy maintained   compression stockings on   sequential compression devices on  Intervention: Prevent Infection  Flowsheets (Taken 05/07/2020 1354)  Infection Prevention:   glycemic control managed   personal protective equipment utilized   promote handwashing   rest/sleep promoted   single patient room provided   visitors restricted/screened  Goal: Optimal Comfort and Wellbeing  Outcome: Adequate for Discharge  Intervention: Provide Person-Centered Care  Flowsheets (Taken 05/07/2020 1354)  Trust Relationship/Rapport:   care explained   choices provided   emotional support provided   empathic listening provided   questions answered   questions encouraged   reassurance provided   thoughts/feelings acknowledged  Goal: Rounds/Family Conference  Outcome: Adequate for Discharge  Flowsheets (Taken 05/07/2020 1354)  Participants:   case manager   nursing   occupational therapy   patient   physical therapy   physician     Problem: Adjustment to Illness (Knee Arthroplasty)  Goal: Optimal Coping  Outcome: Adequate for Discharge  Intervention: Support Psychosocial Response to Surgery and Mobility  Changes  Flowsheets (Taken 05/07/2020 1354)  Supportive Measures:   active listening utilized   self-care encouraged   verbalization of feelings encouraged     Problem: Bleeding (Knee Arthroplasty)  Goal: Absence of Bleeding  Outcome: Adequate for Discharge  Intervention: Monitor and Manage Bleeding  Flowsheets (Taken 05/07/2020 1354)  Bleeding Management: dressing monitored     Problem: Bowel Motility Impaired (Knee Arthroplasty)  Goal: Effective Bowel Elimination  Outcome: Adequate for Discharge  Intervention: Enhance Bowel Motility and Elimination  Flowsheets (Taken 05/07/2020 1354)  Bowel Elimination Management: relaxation techniques promoted  Bowel Elimination Promotion:   adequate fluid intake promoted   ambulation promoted   privacy promoted     Problem: Infection (Knee Arthroplasty)  Goal: Absence of Infection Signs and Symptoms  Outcome: Adequate for Discharge  Intervention: Prevent or Manage Infection  Flowsheets (Taken 05/07/2020 1354)  Fever Reduction/Comfort Measures: room temperature adjusted  Infection Management: aseptic technique maintained     Problem: Joint Function Impaired (Knee Arthroplasty)  Goal: Optimal Functional Ability  Outcome: Adequate for Discharge  Intervention: Protect Joint Integrity  Flowsheets (Taken 05/07/2020 1354)  Positioning: Knee Arthroplasty: positioned in full extension  ROM: Knee Replacement: active flexion/extension encouraged  Intervention: Promote Optimal Functional Status  Flowsheets (Taken 05/07/2020 1354)  Self-Care Promotion:   independence encouraged   BADL personal objects within reach   BADL personal routines maintained   meal set-up provided   safe use of adaptive equipment encouraged  Activity Management:   activity adjusted per tolerance   activity encouraged   dorsiflexion, plantar flexion encouraged  Assistive Device Utilized: walker     Problem: Ongoing Anesthesia Effects (Knee Arthroplasty)  Goal: Anesthesia/Sedation Recovery  Outcome: Adequate for Discharge   Intervention: Optimize  Anesthesia Recovery  Flowsheets (Taken 05/07/2020 1354)  Safety Promotion/Fall Prevention:   activity supervised   fall prevention program maintained   nonskid shoes/slippers when out of bed   safety round/check completed     Problem: Pain (Knee Arthroplasty)  Goal: Acceptable Pain Control  Outcome: Adequate for Discharge  Intervention: Prevent or Manage Pain  Flowsheets (Taken 05/07/2020 1354)  Pain Management Interventions:   care clustered   pain management plan reviewed with patient/caregiver   position adjusted   premedicated for activity   quiet environment facilitated   relaxation techniques promoted  Diversional Activities:   smartphone   television     Problem: Postoperative Nausea and Vomiting (Knee Arthroplasty)  Goal: Nausea and Vomiting Relief  Outcome: Adequate for Discharge  Intervention: Prevent or Manage Nausea and Vomiting  Flowsheets (Taken 05/07/2020 1354)  Nausea/Vomiting Interventions:   nausea triggers minimized   sips of clear liquids given   slow deep breathing encouraged   stimuli minimized   cool cloth applied     Problem: Postoperative Urinary Retention (Knee Arthroplasty)  Goal: Effective Urinary Elimination  Outcome: Adequate for Discharge  Intervention: Monitor and Manage Urinary Retention  Flowsheets (Taken 05/07/2020 1354)  Urinary Elimination Promotion:   positioned for ease of voiding   toileting offered   voiding relaxation promoted     Problem: Pain Acute  Goal: Acceptable Pain Control and Functional Ability  Outcome: Adequate for Discharge  Intervention: Develop Pain Management Plan  Flowsheets (Taken 05/07/2020 1354)  Pain Management Interventions:   care clustered   pain management plan reviewed with patient/caregiver   position adjusted   premedicated for activity   quiet environment facilitated   relaxation techniques promoted  Intervention: Prevent or Manage Pain  Flowsheets (Taken 05/07/2020 1354)  Sensory Stimulation Regulation:   quiet environment  promoted   care clustered  Bowel Elimination Promotion:   adequate fluid intake promoted   ambulation promoted   privacy promoted  Medication Review/Management: medications reviewed  Intervention: [place] (Taken 05/07/2020 1354)  Diversional Activities:   smartphone   television  Supportive Measures:   active listening utilized   self-care encouraged   verbalization of feelings encouraged     Problem: Fall Injury Risk  Goal: Absence of Fall and Fall-Related Injury  Outcome: Adequate for Discharge  Intervention: Identify and Manage Contributors  Flowsheets (Taken 05/07/2020 1354)  Self-Care Promotion:   independence encouraged   BADL personal objects within reach   BADL personal routines maintained   meal set-up provided   safe use of adaptive equipment encouraged  Medication Review/Management: medications reviewed  Intervention: Promote Injury-Free Environment  Flowsheets (Taken 05/07/2020 1354)  Safety Promotion/Fall Prevention:   activity supervised   fall prevention program maintained   nonskid shoes/slippers when out of bed   safety round/check completed

## 2020-05-07 NOTE — OT Treatment (Signed)
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 Agreeable to OT treatment     O: ADL: x 2    ADL:   Pt transferred to EOB w/ CGA for L leg. Pt stood and ambulated into bathroom w/ SBA and RW. Pt completed toileting w/ SBA, ambulated to sink and completed oral hygiene in standing. Pt then ambulated to chair and was able to don underwear w/ min A to thread L LE. Pt stood to pull up w/ SBA. Pt then donned overhead gown w/ Ind and stood to pull down back and over buttocks.                            Assessment:   Pt has made great progress toward goals and has met all goals other than LB dressing as she required min A to thread L LE. Pt states she will have assistance for this at home and is confident that she can safely complete upon d/c. Pt declined sponge bath as she says she will complete once she is home. D/c is planned for today. Will d/c pt.     Plan:     D/c pt    The risks/benefits of therapy have been discussed with the patient and he/she is in agreement with the established plan of care.     Therapist :     Randa Ngo, OTR/L  05/07/2020, 12:34    Total Treatment Time:  24 minutes    Time may include review of chart notes, obtaining patient's functional history from patient/family/medical staff/case management/ancillary personnel, collaboration on findings and treatment options (with the above mentioned individuals), re-assessment, and acute care rehabilitation.

## 2020-05-07 NOTE — Care Plan (Signed)
Assessment:   Pt has made great progress toward goals and has met all goals other than LB dressing as she required min A to thread L LE. Pt states she will have assistance for this at home and is confident that she can safely complete upon d/c. Pt declined sponge bath as she says she will complete once she is home. D/c is planned for today. Will d/c pt.     Plan:     D/c pt    The risks/benefits of therapy have been discussed with the patient and he/she is in agreement with the established plan of care.     Therapist :     Randa Ngo, OTR/L  05/07/2020, 12:34    Total Treatment Time:  24 minutes    Time may include review of chart notes, obtaining patient's functional history from patient/family/medical staff/case management/ancillary personnel, collaboration on findings and treatment options (with the above mentioned individuals), re-assessment, and acute care rehabilitation.

## 2020-05-07 NOTE — PT Treatment (Signed)
[facility]  Physical Therapy Progress Note      Subjective:  "I'm going to go home later today.  I'm going down the stair even if I have to on my back side."   Numeric pain scale:  6out of 10    Areas of gait training assessed:  28 mins      Ambulation surface:  Level, In patient's room and In hallway     Ambulation distance:  120' x 2      Ambulation ability:  Standby assist     Assistive devices:  Rolling walker     Stair climbing:  Standby assist     Ambulation tolerance:  Good     Comments: Pt was seen BID for gait training activities.  In the morning pt ambulated from EOB into restroom, completed ADL with OT, then ambulated 120' on unit before returning to chair at bedside.  In the afternoon pt again ambulated 120' throughout the unit and to PT gym to climb stairs.  Pt up and down 4 steps x 2 with B hand rails.     Areas of functional mobility addressed:     Assistive devices used with bed mobility:  Bed rail and Mechanical controls     Rolling:  Standby assist     Supine - Sit:  Standby assist     Sit - stand:  Standby assist     Bed - Chair/ Chair - bed:  Standby assist     Balance while sitting:  Able to maintain position     Balance while standing: Able to maintain position       Exercise    Location:  leftlower extremity (ies)    Exercises performed:  Ankle pumps, heel raises, LAQ's, heel slides and passive knee extension stretch    Comments:  2 sets 10 reps; L knee 10 to 95 degrees in sitting      Assessment:  Excellent progress with PT.  D/C home at this time, [facility] PT with transition to OP PT.    Plan:  Discharge patient from PT services        Hillard Danker, PT

## 2020-05-07 NOTE — Care Plan (Signed)
EMR review.  Pt s/p Left TKR for DJD.  CM met with pt at bedside room 436.  Pt resting in bed, sat up & dangled at bedside for conversation.  Pt verified demographics, PCP, & insurance information.  Pt resides with her daughter in a single level home with basement.  There is a 1 STE.  Pt doesn't drive & relies on daughter for transportation.  Pt uses [employer] representative.  CM discussed home PT & obtaining a walker which pt would like delivered to her home.  Pt selected [place].  Original of signed choices list placed in bin for UR Heather to scan into [employer].      CM reviewed MOON forms with pt & provided packet.  Signed originals placed in bin for UR Heather to scan into [employer].    05/06/20 1011  IP CONSULT TO CARE MANAGEMENT ([facility] - DO NOT USE FOR BEH HEALTH PATIENTS) ONE TIME    Complete  Discontinue   Process Instructions: If requesting assistance with completion of Advance Directives, please provide patient with the packet.   References: ON CALL ([place])   Provider: (Not yet assigned)   Question: Indication For Consult: Answer: D/C NEEDS - DME        CM pended walker & Home Health orders.  Secure chat to Dr. Lynelle Smoke for signature.  Signed orders sent via Allscripts to [place].  At pt request, walker will be delivered to pt home.  [facility] will have initial PT visit with pt 05/08/20.  Information placed on AVS.

## 2020-05-07 NOTE — Progress Notes (Signed)
[facility]  [place], [place] [address]    INPATIENT PROGRESS NOTE      Toni Tran, Toni Tran  Date of Admission:  05/06/2020  Date of Birth:  02/17/1956  Date of Service:  05/07/2020        Assessment/ Plan:       Left total knee replacement: S/p Postoperative care per Orthopedic surgery. Pain meds as needed.       HTN: Benign. Controlled. Continue Lisinopril and Norvasc. Adjust as needed.       Hx CVA: Continue statin. Continue Anti hypertensive. Modify risk factors.       Obesity: Morbid. BMI >41. Discuss weight loss. Discussed ways to achieve goal weight.      Osteoarthritis: Chronic. Continue current mggmt.       RLS: Chronic. Continue ongoing treatment.       Prophylaxis: Lovenox.       Disposition: Per [facility] team/Ortho.           Chief Complaint: Medical mgmgt post LTKA.       Subjective: Pt seen. See Above.      ROS: Denies fever/chills. No chest pain/SOB. No abdominal pain, nausea, or vomiting.         Today's Physical Exam:  BP 137/61   Pulse 90   Temp 36.6 C (97.8 F)   Resp 18   Ht 1.575 m (5\' 2" )   Wt 102 kg (225 lb)   LMP  (LMP Unknown)   SpO2 95%   BMI 41.15 kg/m       General: AAOx3, Morbidly Obese, No acute distress.   Eyes: Pupils equal and round, reactive to light and accomodation.   HENT:Head atraumatic and normocephalic   Neck: No JVD or thyromegaly or lymphadenopathy   Lungs: Clear to auscultation bilaterally.   Cardiovascular: regular rate and rhythm, S1, S2 normal, no murmur,   Abdomen: Obese, Soft, non-tender, Bowel sounds normal, No hepatosplenomegaly   Extremities: extremities normal, atraumatic, no cyanosis or edema   Skin: Skin warm and dry   Neurologic: Grossly normal   Lymphatics: No lymphadenopathy   Psychiatric: Normal affect and behavior.    Current Medications:  albuterol 90 mcg per inhalation oral inhaler - "Respiratory to administer", 2 Puff, Inhalation, Q6H PRN  aluminum-magnesium hydroxide-simethicone (MAG-AL PLUS) 200-200-20 mg per 5 mL oral  liquid, 30 mL, Oral, Q6H PRN  amLODIPine (NORVASC) tablet, 10 mg, Oral, Daily  aspirin tablet 325 mg, 325 mg, Oral, Q12H  atorvastatin (LIPITOR) tablet, 20 mg, Oral, QPM  bupivacaine liposomal (PF) (EXPAREL) 266 mg/20 mL local injection, 20 mL, Local Infiltration, Once  docusate sodium (COLACE) capsule, 100 mg, Oral, Daily  HYDROcodone-acetaminophen (NORCO) 7.5 mg-325 mg per tablet, 1 Tablet, Oral, Q4H PRN  HYDROcodone-acetaminophen (NORCO) 7.5 mg-325 mg per tablet, 2 Tablet, Oral, Q6H PRN  HYDROmorphone (DILAUDID) 1 mg/mL injection, 1 mg, Intravenous, Q2H PRN  lisinopril (PRINIVIL) tablet, 10 mg, Oral, Daily  LR premix infusion, , Intravenous, Continuous  LR premix infusion, , Intravenous, Continuous  NS 250 mL flush bag, , Intravenous, Q1H PRN  NS flush syringe, 10 mL, Intravenous, Q8HRS  NS flush syringe, 10 mL, Intravenous, Q1H PRN  prochlorperazine (COMPAZINE) 5 mg/mL injection, 10 mg, Intravenous, Q6H PRN  sertraline (ZOLOFT) tablet, 50 mg, Oral, Daily  traZODone (DESYREL) tablet, 50 mg, Oral, QPM          I/O:  I/O last 24 hours:      Intake/Output Summary (Last 24 hours) at 05/07/2020 05/09/2020  Last data filed  at 05/07/2020 0302  Gross per 24 hour   Intake 3056 ml   Output 10 ml   Net 3046 ml     I/O current shift:  No intake/output data recorded.      Laboratory Results:    Reviewed:   Results for orders placed or performed during the hospital encounter of 05/06/20 (from the past 24 hour(s))   CBC W/AUTO DIFF   Result Value Ref Range    WBC 16.0 (H) 4.0 - 11.0 x10^3/uL    RBC 3.06 (L) 4.00 - 5.10 x10^6/uL    HGB 10.5 (L) 12.0 - 15.5 g/dL    HCT 29.3 (L) 36.0 - 45.0 %    MCV 95.7 82.0 - 97.0 fL    MCH 34.3 (H) 27.5 - 33.2 pg    MCHC 35.8 32.0 - 36.0 g/dL    RDW 12.4 11.0 - 16.0 %    PLATELETS 155 150 - 450 x10^3/uL    MPV 9.6 7.4 - 10.5 fL    NEUTROPHIL % 83 (H) 43 - 76 %    LYMPHOCYTE % 7 (L) 15 - 43 %    MONOCYTE % 9 5 - 12 %    EOSINOPHIL % 0 0 - 5 %    BASOPHIL % 0 0 - 3 %    NEUTROPHIL # 13.30 (H) 1.50 - 6.50  x10^3/uL    LYMPHOCYTE # 1.20 1.00 - 4.80 x10^3/uL    MONOCYTE # 1.50 (H) 0.20 - 0.90 x10^3/uL    EOSINOPHIL # 0.00 0.00 - 0.50 x10^3/uL    BASOPHIL # 0.00 0.00 - 0.10 X44^8/JE   BASIC METABOLIC PANEL - AM ONCE   Result Value Ref Range    SODIUM 135 (L) 136 - 145 mmol/L    POTASSIUM 3.9 3.5 - 5.1 mmol/L    CHLORIDE 100 96 - 111 mmol/L    CO2 TOTAL 28 23 - 31 mmol/L    ANION GAP 7 4 - 13 mmol/L    CALCIUM 8.8 8.8 - 10.2 mg/dL    GLUCOSE 140 (H) 65 - 125 mg/dL    BUN 15 8 - 25 mg/dL    CREATININE 0.77 0.60 - 1.05 mg/dL    BUN/CREA RATIO 19 6 - 22    ESTIMATED GFR 82 >=60 mL/min/BSA   MAGNESIUM - AM ONCE   Result Value Ref Range    MAGNESIUM 1.6 (L) 1.8 - 2.6 mg/dL     Ordered:    Lab orders   No lab orders         Radiological Studies:  Reviewed:    Ordered:  None    Problem List:  Active Hospital Problems   (*Primary Problem)    Diagnosis   . Left knee DJD   . Morbid obesity with BMI of 40.0-44.9, adult (CMS HCC)         Peter Garter, MD, 05/07/2020,  09:27

## 2020-05-07 NOTE — Nurses Notes (Signed)
Discharge education done with patient who verbalized understanding of all teaching done and had follow-up questions and concerns answered and addressed to her satisfaction.  Peripheral IV was removed.  Pain was adequately controlled at time of departure per patient statement.  The pt was d/c via wheelchair through the main entrance and left w/ family in private vehicle.

## 2020-05-07 NOTE — Care Management Notes (Signed)
05/07/20 1500   Assessment Detail   Assessment Type Admission   Date of Care Management Update 05/07/20   Readmission   Is this a readmission? No   Social Work Animal nutritionist Status plan in progress   Projected Discharge Date 05/07/20   Anticipated Discharge Disposition Home with [place] with [place]   ([facility])   [place] with [facility] & DME   Patient/Family in Agreement with Plan yes   Discharge Needs Assessment   Concerns To Be Addressed care coordination/care conferences;home safety concerns   Equipment Needed After Discharge walker, rolling   Referral Information   Admission Type observation   Arrived From home or self-care   Address Verified verified-no changes   Insurance Verified verified-no change   Source of Information Patient   Referral Source admission list   Reason for Consult discharge planning   Physician Consult Addressed in a Note?  Yes   Care Manager Assigned to Case Acie Fredrickson, RNCM   Observation Form   Observation Form Given MOON form given   MOON form explained/reviewed with:  Patient;verbalized understanding   MOON form given to Patient   MOON form date of delivery 05/07/20   MOON form time of delivery 1300     EMR review.  Pt s/p Left TKR for DJD.  CM met with pt at bedside room 436.  Pt resting in bed, sat up & dangled at bedside for conversation.  Pt verified demographics, PCP, & insurance information.  Pt resides with her daughter in a single level home with basement.  There is a 1 STE.  Pt doesn't drive & relies on daughter for transportation.  Pt uses [employer] representative.  CM discussed home PT & obtaining a walker which pt would like delivered to her home.  Pt selected [place].  Original of signed choices list placed in bin for UR Heather to scan into [employer].      CM reviewed MOON forms with pt & provided packet.  Signed originals placed in bin for UR Heather to scan into [employer].    05/06/20 1011  IP  CONSULT TO CARE MANAGEMENT ([facility] - DO NOT USE FOR BEH HEALTH PATIENTS) ONE TIME CompleteDiscontinue   Process Instructions: If requesting assistance with completion of Advance Directives, please provide patient with the packet.   References: ON CALL ([place])   Provider: (Not yet assigned)   Question: Indication For Consult: Answer: D/C NEEDS - DME        CM pended walker & Home Health orders.  Secure chat to Dr. Lynelle Smoke for signature.  Signed orders sent via Allscripts to [place].  At pt request, walker will be delivered to pt home.  [facility] will have initial PT visit with pt 05/08/20.  Information placed on AVS.

## 2020-05-07 NOTE — Discharge Summary (Signed)
[facility]  DISCHARGE SUMMARY    PATIENT NAME:  Toni Tran, Toni Tran  MRN:  [id number]  DOB:  1956/02/18    ENCOUNTER DATE:  05/06/2020  INPATIENT ADMISSION DATE:   DISCHARGE DATE:  05/07/2020    ATTENDING PHYSICIAN: [name], DO  SERVICE: [facility] ORTHOPEDICS  PRIMARY CARE PHYSICIAN: [name], MD         LAY CAREGIVER:  ,  ,        PRIMARY DISCHARGE DIAGNOSIS:    Active Hospital Problems    Diagnosis Date Noted   . Morbid obesity with BMI of 40.0-44.9, adult (CMS HCC) [E66.01, Z68.41] 05/06/2020   . 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     Active Non-Hospital Problems    Diagnosis Date Noted   . Chest pain, pleuritic 07/01/2017   . Acute bronchitis 07/01/2017   . Plantar fasciitis of right foot 06/07/2017   . Hypertension    . Hyperlipidemia    . Vitamin D deficiency    . CVA (cerebrovascular accident) (CMS HCC)    . Anxiety    . Depression    . Obesity    . Plantar fascial fibromatosis of right foot    . Tarsal tunnel syndrome of right side         DISCHARGE MEDICATIONS:     Current Discharge Medication List      START taking these medications.      Details   aspirin 325 mg Tablet   325 mg, Oral, EVERY 12 HOURS  Qty: 60 Tablet  Refills: 0     docusate sodium 100 mg Capsule  Commonly known as: COLACE  Start taking on: May 08, 2020   100 mg, Oral, DAILY  Qty: 14 Capsule  Refills: 0     HYDROcodone-acetaminophen 7.5-325 mg Tablet  Commonly known as: NORCO   2 Tablets, Oral, EVERY 6 HOURS PRN  Qty: 40 Tablet  Refills: 0        CONTINUE these medications - NO CHANGES were made during your visit.      Details   amLODIPine 10 mg Tablet  Commonly known as: NORVASC   10 mg, Oral, DAILY, AMLODIPINE BESYLATE   Qty: 90 Tablet  Refills: 1     lisinopriL-hydrochlorothiazide 20-12.5 mg Tablet  Commonly known as: ZESTORETIC   1 Tablet, Oral, 2 TIMES DAILY  Qty: 180 Tablet  Refills: 1     mupirocin 2 % Ointment  Commonly known as: BACTROBAN   Apply  Topically, 3 TIMES DAILY  Qty: 45 g  Refills: 0     sertraline 50 mg Tablet  Commonly known as: ZOLOFT   50 mg, Oral, DAILY  Qty: 30 Tab  Refills: 2     simvastatin 20 mg Tablet  Commonly known as: ZOCOR   20 mg, Oral, EVERY EVENING  Qty: 90 Tab  Refills: 1     traZODone 150 mg Tablet  Commonly known as: DESYREL   TAKE 1 TABLET(150 MG) BY MOUTH EVERY NIGHT  Qty: 90 Tablet  Refills: 1     * Ventolin HFA 90 mcg/actuation HFA Aerosol Inhaler  Generic drug: albuterol sulfate   2 Puffs, Inhalation, EVERY 6 HOURS PRN  Qty: 1 Inhaler  Refills: 0     * albuterol sulfate 90 mcg/actuation HFA Aerosol Inhaler  Commonly known as: ProAir HFA   1-2 Puffs, Inhalation, EVERY 6 HOURS PRN, W/ spacer  Qty: 1  Inhaler  Refills: 3         * This list has 2 medication(s) that are the same as other medications prescribed for you. Read the directions carefully, and ask your doctor or other care provider to review them with you.            STOP taking these medications.    Ibuprofen 800 mg Tablet  Commonly known as: MOTRIN          Discharge med list refreshed?  YES                     ALLERGIES:  No Known Allergies          HOSPITAL PROCEDURE(S):   Bedside Procedures:  No orders of the defined types were placed in this encounter.    Surgical Procedure(s):  Left - ARTHROPLASTY KNEE TOTAL - Wound Class: Clean Wound: Uninfected operative wounds in which no inflammation occurred    REASON FOR HOSPITALIZATION AND HOSPITAL COURSE     BRIEF HPI:  This is a 64 y.o., female admitted for knee pain which is severe.  She has ACL insufficiency, root tear of the medial meniscus, osteoarthritis.  She wished to undergo a total knee arthroplasty    BRIEF HOSPITAL NARRATIVE:  She has undergone a left total knee arthroplasty that, location.  Attempted spinal was performed, and after a while a general anesthesia was performed.  She has been involved in therapy protocol and has been ambulating weight-bearing as tolerated.  Additional therapy later today.  She  is cleared for going home.     TRANSITION/POST DISCHARGE CARE/PENDING TESTS/REFERRALS:  Sound hospitalist thank you        CONDITION ON DISCHARGE:  A. Ambulation: Ambulation with assistive device  B. Self-care Ability: With partial assistance  C. Cognitive Status Alert and Oriented x 3  D. Code status at discharge:   Code Status Information     Code Status    Full Code                 LINES/DRAINS/WOUNDS AT DISCHARGE:   Patient Lines/Drains/Airways Status    Active Line / Dialysis Catheter / Dialysis Graft / Drain / Airway / Wound     Name: Placement date: Placement time: Site: Days:    Peripheral IV Posterior;Right Dorsal Metacarpals  (top of hand)  05/06/20   0739   1    Surgical Incision Anterior;Left Knee  05/06/20   0851   1                DISCHARGE DISPOSITION:  Home discharge      Start with a physical therapy program for the total knee        DISCHARGE INSTRUCTIONS:  Follow-up Information     Orthopaedics,  .    Specialty: Orthopaedics  Contact information:  [place] [phone]  [phone]                  DISCHARGE INSTRUCTION - DIET     Diet: RESUME HOME DIET      DISCHARGE INSTRUCTION - INCISION/WOUND CARE    You may shower today and daily.    Remove the gummy bandage in 5 days and may shower directly over this area.  Do not soak under standing water     Instructions for incision/wound care: May Shower, NO Tub Baths or Swimming      DISCHARGE INSTRUCTION - [place]  It is quite important to participate in a physical therapy program to attain excellent outcomes.  Continue with the ice compression unit.    To avoid blood clots, aspirin 325 mg twice daily for 1 month is prescribed    Report any problems     ASPIRIN ALREADY ORDERED     PLEASE KEEP FOLLOW-UP APPT ALREADY SCHEDULED IN [facility]    Please keep your appointment with your follow-up provider. If for some reason you need to cancel, be sure to reschedule as this is important for your care.      Department [facility] [[id number]]                 [name], DO    Copies sent to Care Team       Relationship Specialty Notifications Start End    [name], MD PCP - General [facility]  03/17/17     Phone: [phone] Fax: [phone]         [address] [place] [address]            Referring providers can utilize [email or web address] to access their referred [facility] patient's information.

## 2020-05-07 NOTE — ACP (Advance Care Planning) (Signed)
Goals of Care  (GOC) Documentation    Purpose of Encounter: evaluationdiscussion    Parties in Attendance: Patient    Patient's Decisional Capacity (Name of surrogate if no capacity)  possesses medical decision making capacity     Subjective/Patient Story:      Left knee pain      Objective/Medical Story:    Left total knee replacement: S/p Postoperative care per Orthopedic surgery. Pain meds as needed.       HTN: Benign. Controlled. Continue Lisinopril and Norvasc. Adjust as needed.       Hx CVA: Continue statin. Continue Anti hypertensive. Modify risk factors.       Obesity: Morbid. BMI >41. Discuss weight loss. Discussed ways to achieve goal weight.      Osteoarthritis: Chronic. Continue current mggmt.       RLS: Chronic. Continue ongoing treatment.           Goals of Care Determinations:     I discussed end of life care goals and wisheswithpatient. I discussed CPR in terms of chest compression, shock, and IV drug administration. I discussed mechanical ventilation in terms of being placed on a respirator or other life-saving devices and machines. I discussed what these procedures will and mayentail, andscenarios whereany of these may be needed including in the setting of cardiopulmonary arrest, unremitting seizures. Patientconsidered all aforementioned options and elects a full code status at this time.        Plan:  Patient Active Problem List   Diagnosis   . Hypertension   . Hyperlipidemia   . Vitamin D deficiency   . CVA (cerebrovascular accident) (CMS HCC)   . Anxiety   . Depression   . Obesity   . Plantar fascial fibromatosis of right foot   . Tarsal tunnel syndrome of right side   . Plantar fasciitis of right foot   . Chest pain, pleuritic   . Acute bronchitis   . Left knee DJD   . Morbid obesity with BMI of 40.0-44.9, adult (CMS HCC)         Code Status:  Code Status Information     Code Status    Full Code          Other documents completed (e.g. Advance directives, POLST, MOLST):  no     Time spent discussing advance care planning (ACP) - required if submitting a charge for an ACP discussion: 18 minutes.

## 2020-05-07 NOTE — Progress Notes (Signed)
Acey Lav, DO     [facility], [facility]   NAME- Toni Tran   DATE- 05/07/2020   MRN- [id number]     Post Operative Day # 1  Procedure:  Left total knee arthroplasty  Chief Complaint:  Left knee pain      She is resolving her early problems last evening with weight-bearing to the bathroom 3 times.  She is having some moderate discomfort and pain meds are controlling this.  No other misadventure or complaints today.    Blood pressure (!) 118/36, pulse 56, temperature 36.4 C (97.6 F), resp. rate 16, height 1.575 m (5\' 2" ), weight 102 kg (225 lb), SpO2 (!) 89 %, not currently breastfeeding.    Patient Active Problem List   Diagnosis   . Hypertension   . Hyperlipidemia   . Vitamin D deficiency   . CVA (cerebrovascular accident) (CMS HCC)   . Anxiety   . Depression   . Obesity   . Plantar fascial fibromatosis of right foot   . Tarsal tunnel syndrome of right side   . Plantar fasciitis of right foot   . Chest pain, pleuritic   . Acute bronchitis   . Left knee DJD   . Morbid obesity with BMI of 40.0-44.9, adult (CMS HCC)     Her dressing is dry.  Calf is swollen as would be expected.  Modest bruising.    Hemoglobin 10.5, acute blood loss anemia secondary to operative intervention, as expected    Mobility of the knee and leg were discussed.  She will work diligently on getting this maximized.  She has a cold compression unit at home as she is purchase this.    We will be able discharge her home today after her 2nd therapy session this afternoon.  She will continue with therapy protocols as an outpatient.  This is quite important.  Questions answered    09-25-1983, DO

## 2020-05-08 NOTE — Care Management Notes (Signed)
Referral Information  ++++++ Placed Provider #1 ++++++  Case Manager: Sheila Richter  Provider Type: Home Health  Provider Name: [facility]  Address:  [address]  [place], [place] [address]  Contact:  admissions  Phone: [phone] x  Fax:   Fax: [phone]

## 2020-05-08 NOTE — Care Management Notes (Signed)
Referral Information  ++++++ Placed Provider #1 ++++++  Case Manager: Sheila Richter  Provider Type: DME  Provider Name: [facility] [place] [facility]  Address:  [address]  [place], [place] [address]  Contact: LISA POPLIN    Phone: [phone] x  Fax:   Fax: [phone]

## 2020-05-19 ENCOUNTER — Encounter (HOSPITAL_BASED_OUTPATIENT_CLINIC_OR_DEPARTMENT_OTHER): Payer: Self-pay | Admitting: Orthopaedic Surgery

## 2020-05-22 ENCOUNTER — Other Ambulatory Visit: Payer: Self-pay

## 2020-05-22 ENCOUNTER — Ambulatory Visit (INDEPENDENT_AMBULATORY_CARE_PROVIDER_SITE_OTHER): Payer: Medicare Other | Admitting: Orthopaedic Surgery

## 2020-05-22 ENCOUNTER — Encounter (INDEPENDENT_AMBULATORY_CARE_PROVIDER_SITE_OTHER): Payer: Medicare Other

## 2020-05-22 VITALS — BP 136/82 | HR 85 | Temp 97.8°F | Ht 62.0 in | Wt 225.0 lb

## 2020-05-22 DIAGNOSIS — Z96652 Presence of left artificial knee joint: Secondary | ICD-10-CM

## 2020-05-22 DIAGNOSIS — Z9889 Other specified postprocedural states: Secondary | ICD-10-CM

## 2020-05-22 NOTE — Progress Notes (Signed)
Delete.

## 2020-05-22 NOTE — Progress Notes (Signed)
[facility] MEDICAL OFFICE BUILDING  [facility]  [address]  [place] [address]  Dept: [phone]  Dept Fax: [phone]  Loc: [phone]  Loc Fax: [phone]    CC:    Chief Complaint   Patient presents with   . Follow-up     KNEE REPLACEMENT        HPI:   Toni Tran is a 64 y.o. female who presents to the clinic for first postop visit after left total knee arthroplasty.  She is doing very well.  She is not taking pain meds.  She has not yet been to therapy.  Aspirin has been utilized twice daily.  She presents on a walker and feels good she says..    ROS:  GENERAL: No fever or chills  HEENT: no current nosebleed; no new onset visual disturbances  NECK: no swelling  CARDIOVASCULAR:  No severe chest pain  RESPIRATORY:  No severe SOA  ABDOMINAL: no severe abdominal pain  MSK: as per HPI  NEURO: no severe headache; as per HPI  SKIN: no current major rash  HEME: no major bruising or bleeding issues  EXTREMITIES: as per HPI  PSYCH: no delusions/hallucinations    PMH:   Past Medical History:   Diagnosis Date   . Anxiety    . 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 daily   . Chronic pain     right foot   . 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 Depression    . History of anesthesia complications     Required overnight stay after tarsal tunnel release d/t hypoxia per pt,    . Hypercholesterolemia    . Hyperlipidemia    . Hypertension    . MRSA infection    . Obesity    . Peripheral edema     right foot   . Peripheral neuropathy     Right foot s/p tarsal tunnel release   . Plantar fascial fibromatosis of right foot    . PONV (postoperative nausea and vomiting)     requests Anti Emetic   . RLS (restless legs syndrome)    . Snores     Never has had sleep study   . Tarsal tunnel syndrome, right    . Vitamin D deficiency    . Wears glasses          Past Surgical History:   Procedure  Laterality Date   . HX APPENDECTOMY  childhood   . HX EYE SURGERY Bilateral Childhood    Muscle Surgery   . HX FOOT SURGERY Right 06/07/2017    Tarsal Tunnel release; FASCIOTOMY PLANTAR   . HX SHOULDER SURGERY Right 06/2016    Exc. Bone Spur, Repair Rotator Cuff Tear   . HX TUBAL LIGATION  1992          Patient Active Problem List    Diagnosis   . Morbid obesity with BMI of 40.0-44.9, adult (CMS HCC)   . Left knee DJD   . Chest pain, pleuritic   . Acute bronchitis   . Plantar fasciitis of right foot   . Hypertension   . Hyperlipidemia   . Vitamin D deficiency   . CVA (cerebrovascular accident) (CMS HCC)   . Anxiety   . Depression   . Obesity   . Plantar fascial fibromatosis of right foot   . Tarsal  tunnel syndrome of right side       MEDICATIONS:  Current Outpatient Medications   Medication Sig   . albuterol sulfate (PROAIR HFA) 90 mcg/actuation Inhalation HFA Aerosol Inhaler Take 1-2 Puffs by inhalation Every 6 hours as needed W/ spacer   . amLODIPine (NORVASC) 10 mg Oral Tablet Take 1 Tablet (10 mg total) by mouth Once a day AMLODIPINE BESYLATE   . aspirin 325 mg Oral Tablet Take 1 Tablet (325 mg total) by mouth Every 12 hours for 30 days   . docusate sodium (COLACE) 100 mg Oral Capsule Take 1 Capsule (100 mg total) by mouth Once a day for 14 days   . HYDROcodone-acetaminophen (NORCO) 7.5-325 mg Oral Tablet Take 2 Tablets by mouth Every 6 hours as needed   . lisinopriL-hydrochlorothiazide (ZESTORETIC) 20-12.5 mg Oral Tablet Take 1 Tablet by mouth Twice daily   . mupirocin (BACTROBAN) 2 % Ointment by Apply Topically route Three times a day (Patient not taking: Reported on 05/06/2020)   . sertraline (ZOLOFT) 50 mg Oral Tablet Take 1 Tab (50 mg total) by mouth Once a day (Patient not taking: Reported on 04/28/2020)   . simvastatin (ZOCOR) 20 mg Oral Tablet Take 1 Tab (20 mg total) by mouth Every evening   . traZODone (DESYREL) 150 mg Oral Tablet TAKE 1 TABLET(150 MG) BY MOUTH EVERY NIGHT   . VENTOLIN HFA 90  mcg/actuation Inhalation HFA Aerosol Inhaler Take 2 Puffs by inhalation Every 6 hours as needed        ALLERGIES:   No Known Allergies     FAMILY HISTORY:  Family Medical History:     Problem Relation (Age of Onset)    Alzheimer's/Dementia Mother, Father    Arthritis-osteo Mother, Father    Blood Clots Father    Congestive Heart Failure Father    Coronary Artery Disease Father    Diabetes Father    Heart Attack Mother, Father    High Cholesterol Mother, Father    Hypertension (High Blood Pressure) Mother, Father    Migraines Mother, Father    Sleep disorders Father    Stroke Mother, Father              SOCIAL HISTORY:   reports that she has never smoked. She has never used smokeless tobacco. She reports that she does not drink alcohol and does not use drugs.    PHYSICAL EXAM:  BP 136/82   Pulse 85   Temp 36.6 C (97.8 F) (Thermal Scan)   Ht 1.575 m (5\' 2" )   Wt 102 kg (225 lb)   LMP  (LMP Unknown)   SpO2 100%   BMI 41.15 kg/m       GENERAL:  No acute distress; clean, appropriately dressed; well-nourished  HEENT: head normocephalic, atraumatic; nose w/out rhinorrhea/epistaxis  EYES: EOMI; vision intact   NECK: no JVD or obvious swelling  CARDIOVASCULAR: no pallor or cyanosis  RESPIRATORY:  non-labored breathing; symmetric chest wall rise bilat; no audible wheezing  ABDOMEN: no severe distention  NEURO:  Alert and oriented x3; conversant  EXTREMITIES: no diffuse obvious swelling  HEME: no diffuse obvious bruising or bleeding  PSYCH: normal affect and mood  MSK:  Incisional area is doing well and staples removed.  She does not have a marked amount of swelling around her knee or calf.  Range of motion is 20-95.  Good stability is noted.  Sensor mechanism intact    IMAGING:   Four view x-rays of the left knee are obtained  today.  These include AP, AP, lateral, patellofemoral.  This shows a well placed and sealed total knee.  The patella sits slightly to a tilt  Images were personally reviewed and independently  interpreted by me at today's visit.   Priscille Kluver, DO  05/22/2020, 14:17       ASSESSMENT/PLAN:    ICD-10-CM    1. Post-operative state  Z98.890 XR KNEE LEFT 3 VIEW - AP/LAT/SUNRISE STANDING     PHYSICAL THERAPY (EVALUATE AND TREAT)   2. History of total left knee replacement  Z96.652 PHYSICAL THERAPY (EVALUATE AND TREAT)     She is encouraged to continue with range of motion and strengthening exercises.  Will try to get her knee straighter.  She will work on kitchen sinks, extensions, prone and supine.  Continue with her aspirin for 2 more weeks.  Take antibiotics prior to dental or surgical procedures.  Start physical therapy and an order was provided.  Return in 4 weeks for review.  She will be off of her walker soon it appears.    Priscille Kluver, DO  05/22/2020, 14:17    Portions of this note may be dictated using voice recognition software or a dictation service. Variances in spelling and vocabulary are possible and unintentional. Not all errors are caught/corrected. Please notify the [facility] if any discrepancies are noted or if the meaning of any statement is not clear.

## 2020-05-27 ENCOUNTER — Ambulatory Visit (HOSPITAL_BASED_OUTPATIENT_CLINIC_OR_DEPARTMENT_OTHER)
Admission: RE | Admit: 2020-05-27 | Discharge: 2020-05-27 | Disposition: A | Discharge status: Still In Hospital | Payer: Medicare Other | Source: Ambulatory Visit | Attending: Orthopaedic Surgery | Admitting: Orthopaedic Surgery

## 2020-05-27 ENCOUNTER — Other Ambulatory Visit: Payer: Self-pay

## 2020-05-27 DIAGNOSIS — Z9889 Other specified postprocedural states: Secondary | ICD-10-CM | POA: Insufficient documentation

## 2020-05-27 DIAGNOSIS — Z96652 Presence of left artificial knee joint: Secondary | ICD-10-CM | POA: Insufficient documentation

## 2020-05-27 NOTE — PT Evaluation (Signed)
[facility] (A Department of [facility])  [address], [place]  [place], [place] [address]  Phone [phone]  Fax [phone]     Outpatient Rehabilitation Services  Physical Therapy Initial Evaluation    Patient Name: Toni Tran  Date of Birth: [birth year]  MRN: [id number]  Payor: [facility] MEDICARE / Plan: [place] / Product Type: MEDICARE [facility] /   Diagnosis:  Post operative state.  History of L total knee replacement.  Recertification Date: 29/51/88  Referring Physician:  Dr Lynelle Smoke  Next Physician Follow-up Visit: Unknown    Past Medical History:   Diagnosis Date   . Anxiety    . 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 daily   . Chronic pain     right foot   . 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 Depression    . History of anesthesia complications     Required overnight stay after tarsal tunnel release d/t hypoxia per pt,    . Hypercholesterolemia    . Hyperlipidemia    . Hypertension    . MRSA infection    . Obesity    . Peripheral edema     right foot   . Peripheral neuropathy     Right foot s/p tarsal tunnel release   . Plantar fascial fibromatosis of right foot    . PONV (postoperative nausea and vomiting)     requests Anti Emetic   . RLS (restless legs syndrome)    . Snores     Never has had sleep study   . Tarsal tunnel syndrome, right    . Vitamin D deficiency    . Wears glasses      Past Surgical History:   Procedure Laterality Date   . HX APPENDECTOMY  childhood   . HX EYE SURGERY Bilateral Childhood    Muscle Surgery   . HX FOOT SURGERY Right 06/07/2017    Tarsal Tunnel release; FASCIOTOMY PLANTAR   . HX SHOULDER SURGERY Right 06/2016    Exc. Bone Spur, Repair Rotator Cuff Tear   . HX TUBAL LIGATION  1992     Subjective/Objective:   Toni Tran is a 64 y.o., female, presenting s/p L TKA DOS 05/06/20 after history of chronic knee pain.  After  surgery, she went home where she lives with her fiance and grown grandchildren in 1 story home.  She has one step to enter her home.  She did not have home health PT services.  She has been walking with her walker at all times, occasionally walking without device short distances.  She owns cane but has not tried using it yet.  She reports pain is under control with Asprin only.  She rates it 5/10 on average.  She is sleeping in her bed, denies significant sleep disruption due to knee pain.  She is now Ic shower, dressing herself.  She has been able to cook and resume household duties but takes frequent rest breaks.  Her fiance has been driving her in community.  She went to the store but used motorized cart.  Prior to surgery, she was I c all ADL without assistive device.  She is disabled from history of 2 CVA's.  She has some R UE>LE weakness from her stroke and a slight tremor in her R hand.  She denies other joint problems.  Objective Findings:   Observation:  B pitting edema L>R.  Incision healing.  Minimal bruising, minimal swelling.  No drainage.  No streaking.     AROM/PROM:  Knee extension: lacking  20 degrees extension L.  R 0 deg.  Knee flexion: 92 degress flexion L.  120 deg R.  Ankle:  Limited gastroc length B.    STRENGTH:  Quadriceps: trace quad set.  Unable to SLR.  Requires assistance for L LE placement with sit to supine. R LE 5/5  Hamstring: 4/5 within available range L.  5/5 R    FUNCTIONAL MOBILITY:  TUG test: 28 seconds with FWW.  Forward flexed trunk, step to gait, decreased stance time on L LE.    NEURO SCREEN:  Sensation: Denies N/T.    SPECIAL TESTING:  Pt scored 51/100 on Lysholm Knee Scoring Scale.                            Assessment:   Fall Risk Assessment  Pt has problems with loss of balance, requires the use of an assistive device (walker, cane, crutches, etc) and has a history of one of the following:  Seizure disorder, Orthostatic blood pressure problems or stroke/CVA    Pt is a  64 y/o female presenting s/p L TKA DOS 05/06/20.  Subjectively she is doing very well, resuming normal household ADL and pain is under control.  Objectively lacking both extension and flexion and heavily reliant on assistive device with significantly antalgic gait.  She has history of 2 CVA's with residual R sided weakness.  She would benefit from skilled PT to address.      Goals:   Short Term Goals:  In 2 weeks, to be achieved by 06/10/20:  Pt will be compliant with therapy sessions and HEP.  Pt will have 50% recall and demo of HEP for knee ROM and quad/hamstring strengthening.  Pt will be able to ambulate household and community distances with [facility] I/safely.  Pt will have 0-->110 deg L knee AROM to allow for easier completion of ADL.    Long Term Goals:  In 4-6 weeks: to be achieved by 07/11/20:  Pt will demonstrate 100% independence with HEP.  Pt will have 0 to 120 deg of R knee AROM to allow for easier completion of ADL.  Pt will have 5/5 painless knee strength to allow for normalization of gait pattern.  Pt will be able to ambulate household and community distances s AD.  Pt will be able to ascend/descend stairs step over step without pain.  Pt will score 75/100 or > on Lysholm Knee Scoring Scale indicating functional improvement since initial evaluation.      Plan:     PT 2-3x/week 4-6 weeks for therapeutic exercise, manual therapy, gait training, electrical stimulation, neuromuscular reeducation.    The risks/benefits of therapy have been discussed with the patient and he/she is in agreement with the established plan of care.     Therapist :       Nelida Meuse, PT 05/27/2020 17:25

## 2020-05-29 ENCOUNTER — Ambulatory Visit (HOSPITAL_BASED_OUTPATIENT_CLINIC_OR_DEPARTMENT_OTHER)
Admission: RE | Admit: 2020-05-29 | Discharge: 2020-05-29 | Disposition: A | Discharge status: Still In Hospital | Payer: Medicare Other | Source: Ambulatory Visit | Attending: Orthopaedic Surgery | Admitting: Orthopaedic Surgery

## 2020-05-29 ENCOUNTER — Other Ambulatory Visit: Payer: Self-pay

## 2020-05-29 NOTE — PT Treatment (Signed)
[facility]  [place], [place] [address]     Outpatient Rehabilitation Services  Physical Therapy Progress Note    Patient Name: Toni Tran  Date of Birth: 01/01/1956  MRN: [id number]  Payor: [facility] MEDICARE / Plan: [facility] MEDICARE DUAL COMPLETE / Product Type: MEDICARE [facility] /   Diagnosis:  Post operative state.  History of L total knee replacement.  Recertification Date: 06/26/20  Referring Physician:  Dr Marden Noble  Next Physician Follow-up Visit: Unknown    05/29/2020 is the patient's 2nd visit    Subjective   She was sore after initial evaluation.    Objective   L TKA 05/06/20.  Hx of stroke affecting her R side.    Therapeutic Exercise: 32 min  Nustep 8 min  HCS, HSS 3x30 sec  Knee flexion stretch 3x30 sec  Heel raises, toe raises x30  Step ups 4 inch step x15 L LE leading  Seated LAQ x20  Supine SAQ x20  Heel prop on bolster x1 min     Total Treatment time:  32 min                        Assessment:   She was fatigued, had some shortness of breath throughout session today.  Most sensitive to extension.    Goals:   Short Term Goals:  In 2 weeks, to be achieved by 06/10/20:  Pt will be compliant with therapy sessions and HEP.  Pt will have 50% recall and demo of HEP for knee ROM and quad/hamstring strengthening.  Pt will be able to ambulate household and community distances with [facility] I/safely.  Pt will have 0-->110 deg L knee AROM to allow for easier completion of ADL.    Long Term Goals:  In 4-6 weeks: to be achieved by 07/11/20:  Pt will demonstrate 100% independence with HEP.  Pt will have 0 to 120 deg of R knee AROM to allow for easier completion of ADL.  Pt will have 5/5 painless knee strength to allow for normalization of gait pattern.  Pt will be able to ambulate household and community distances s AD.  Pt will be able to ascend/descend stairs step over step without pain.  Pt will score 75/100 or > on Lysholm Knee Scoring Scale indicating functional improvement since initial evaluation.      Plan:     PT  2-3x/week 4-6 weeks for therapeutic exercise, manual therapy, gait training, electrical stimulation, neuromuscular reeducation.      The risks/benefits of therapy have been discussed with the patient and he/she is in agreement with the established plan of care.    Therapist :     Nelida Meuse, PT

## 2020-06-02 ENCOUNTER — Other Ambulatory Visit: Payer: Self-pay

## 2020-06-02 ENCOUNTER — Ambulatory Visit (HOSPITAL_BASED_OUTPATIENT_CLINIC_OR_DEPARTMENT_OTHER)
Admission: RE | Admit: 2020-06-02 | Discharge: 2020-06-02 | Disposition: A | Discharge status: Still In Hospital | Payer: Medicare Other | Source: Ambulatory Visit | Attending: Orthopaedic Surgery | Admitting: Orthopaedic Surgery

## 2020-06-02 NOTE — PT Treatment (Signed)
[facility]  [place], [place] [address]     Outpatient Rehabilitation Services  Physical Therapy Progress Note    Patient Name: Toni Tran  Date of Birth: 1956-07-27  MRN: [id number]  Payor: [facility] MEDICARE / Plan: [facility] MEDICARE DUAL COMPLETE / Product Type: MEDICARE [facility] /   Diagnosis:  Post operative state.  History of L total knee replacement.  Recertification Date: 06/26/20  Referring Physician:  Dr Marden Noble  Next Physician Follow-up Visit: Unknown    06/02/2020 is the patient's 3rd visit    Subjective   She was very sore last night, could not get comfortable to sleep.    Objective   L TKA 05/06/20.  Hx of stroke affecting her R side.    Therapeutic Exercise: 32 min  Nustep 8 min  HCS, HSS 3x30 sec  Knee flexion stretch 3x30 sec  Heel raises, toe raises x30  Step ups 6 inch step x15 L LE leading  Seated LAQ x20  Supine SAQ x20  Heel prop on bolster x1 min     Total Treatment time:  32 min                        Assessment:   Cont to lack extension>flexion but pushing herself appropriately.    Goals:   Short Term Goals:  In 2 weeks, to be achieved by 06/10/20:  Pt will be compliant with therapy sessions and HEP.  Pt will have 50% recall and demo of HEP for knee ROM and quad/hamstring strengthening.  Pt will be able to ambulate household and community distances with [facility] I/safely.  Pt will have 0-->110 deg L knee AROM to allow for easier completion of ADL.    Long Term Goals:  In 4-6 weeks: to be achieved by 07/11/20:  Pt will demonstrate 100% independence with HEP.  Pt will have 0 to 120 deg of R knee AROM to allow for easier completion of ADL.  Pt will have 5/5 painless knee strength to allow for normalization of gait pattern.  Pt will be able to ambulate household and community distances s AD.  Pt will be able to ascend/descend stairs step over step without pain.  Pt will score 75/100 or > on Lysholm Knee Scoring Scale indicating functional improvement since initial evaluation.      Plan:     PT 2-3x/week 4-6  weeks for therapeutic exercise, manual therapy, gait training, electrical stimulation, neuromuscular reeducation.      The risks/benefits of therapy have been discussed with the patient and he/she is in agreement with the established plan of care.    Therapist :     Nelida Meuse, PT

## 2020-06-03 ENCOUNTER — Ambulatory Visit (HOSPITAL_BASED_OUTPATIENT_CLINIC_OR_DEPARTMENT_OTHER): Payer: Self-pay

## 2020-06-05 ENCOUNTER — Ambulatory Visit (HOSPITAL_BASED_OUTPATIENT_CLINIC_OR_DEPARTMENT_OTHER)
Admission: RE | Admit: 2020-06-05 | Discharge: 2020-06-05 | Disposition: A | Discharge status: Still In Hospital | Payer: Medicare Other | Source: Ambulatory Visit | Attending: Orthopaedic Surgery | Admitting: Orthopaedic Surgery

## 2020-06-05 ENCOUNTER — Other Ambulatory Visit: Payer: Self-pay

## 2020-06-05 NOTE — PT Treatment (Signed)
[facility]  [place], [place] [address]     Outpatient Rehabilitation Services  Physical Therapy Progress Note    Patient Name: Toni Tran  Date of Birth: 1956-08-23  MRN: [id number]  Payor: [facility] MEDICARE / Plan: [facility] MEDICARE DUAL COMPLETE / Product Type: MEDICARE [facility] /   Diagnosis:  Post operative state.  History of L total knee replacement.  Recertification Date: 06/26/20  Referring Physician:  Dr Marden Noble  Next Physician Follow-up Visit: Unknown    06/05/2020 is the patient's 4th visit    Subjective   "It is feeling pretty good today."    Objective   L TKA 05/06/20.  Hx of stroke affecting her R side.    Therapeutic Exercise: 32 min  Nustep 8 min (4 min seat back for extension, 4 min close for flexion)  HCS, HSS 3x30 sec  Knee flexion stretch 3x30 sec 8 and 4 inch steps stacked  Heel raises, toe raises x30  Step ups 6 inch step x20 L LE leading  Seated LAQ x20 (HEP)  Supine SAQ x20  Heel prop for extension on bolster x1 min, 2 separate trials of this     Total Treatment time:  32 min                        Assessment:   Cont to lack extension>flexion but pushing herself appropriately with exercises.  Requires cues for continuity and performing exercises correctly.    Goals:   Short Term Goals:  In 2 weeks, to be achieved by 06/10/20:  Pt will be compliant with therapy sessions and HEP.  Pt will have 50% recall and demo of HEP for knee ROM and quad/hamstring strengthening.  Pt will be able to ambulate household and community distances with [facility] I/safely.  Pt will have 0-->110 deg L knee AROM to allow for easier completion of ADL.    Long Term Goals:  In 4-6 weeks: to be achieved by 07/11/20:  Pt will demonstrate 100% independence with HEP.  Pt will have 0 to 120 deg of R knee AROM to allow for easier completion of ADL.  Pt will have 5/5 painless knee strength to allow for normalization of gait pattern.  Pt will be able to ambulate household and community distances s AD.  Pt will be able to ascend/descend stairs  step over step without pain.  Pt will score 75/100 or > on Lysholm Knee Scoring Scale indicating functional improvement since initial evaluation.      Plan:     PT 2-3x/week 4-6 weeks for therapeutic exercise, manual therapy, gait training, electrical stimulation, neuromuscular reeducation.      The risks/benefits of therapy have been discussed with the patient and he/she is in agreement with the established plan of care.    Therapist :     Nelida Meuse, PT

## 2020-06-09 ENCOUNTER — Other Ambulatory Visit: Payer: Self-pay

## 2020-06-09 ENCOUNTER — Ambulatory Visit (HOSPITAL_BASED_OUTPATIENT_CLINIC_OR_DEPARTMENT_OTHER)
Admission: RE | Admit: 2020-06-09 | Discharge: 2020-06-09 | Disposition: A | Discharge status: Still In Hospital | Payer: Medicare Other | Source: Ambulatory Visit | Attending: Orthopaedic Surgery | Admitting: Orthopaedic Surgery

## 2020-06-09 NOTE — PT Treatment (Signed)
[facility]  [place], [place] [address]     Outpatient Rehabilitation Services  Physical Therapy Progress Note    Patient Name: Toni Tran  Date of Birth: 03-15-1956  MRN: [id number]  Payor: [facility] MEDICARE / Plan: [facility] MEDICARE DUAL COMPLETE / Product Type: MEDICARE [facility] /   Diagnosis:  Post operative state.  History of L total knee replacement.  Recertification Date: 06/26/20  Referring Physician:  Dr Marden Noble  Next Physician Follow-up Visit: Unknown    06/09/2020 is the patient's 5th visit    Subjective   I am already glad that I had the knee done.  Still in a lot of pain.    Disabled prior to surgery.    Objective   L TKA 05/06/20.  Hx of stroke affecting her R side.    Therapeutic Exercise: 32 min  Nustep 8 min (4 min seat back for extension, 4 min close for flexion)  HCS, HSS 3x30 sec  Knee flexion stretch 3x30 sec 8 and 4 inch steps stacked  Heel raises, toe raises x30  Step ups 6 inch step x20 L LE leading    Table  SAQ #0. X 20  Bridge over a bolster. #0.  X 20  Heel slide #0 x 20  Heel prop for extension on bolster x1 min, 2 separate trials of this     Total Treatment time:  34 min                        Assessment:   Cont to lack extension>flexion but pushing herself appropriately with exercises.  Requires cues for continuity and performing exercises correctly.    Goals:   Short Term Goals:  In 2 weeks, to be achieved by 06/10/20:  Pt will be compliant with therapy sessions and HEP.  Pt will have 50% recall and demo of HEP for knee ROM and quad/hamstring strengthening.  Pt will be able to ambulate household and community distances with [facility] I/safely.  Pt will have 0-->110 deg L knee AROM to allow for easier completion of ADL.    Long Term Goals:  In 4-6 weeks: to be achieved by 07/11/20:  Pt will demonstrate 100% independence with HEP.  Pt will have 0 to 120 deg of R knee AROM to allow for easier completion of ADL.  Pt will have 5/5 painless knee strength to allow for normalization of gait pattern.  Pt will  be able to ambulate household and community distances s AD.  Pt will be able to ascend/descend stairs step over step without pain.  Pt will score 75/100 or > on Lysholm Knee Scoring Scale indicating functional improvement since initial evaluation.      Plan:     PT 2-3x/week 4-6 weeks for therapeutic exercise, manual therapy, gait training, electrical stimulation, neuromuscular reeducation.    She is set up again with Toni Tran this week and then back to Toni Tran next week.      The risks/benefits of therapy have been discussed with the patient and he/she is in agreement with the established plan of care.    Therapist :     Algis Greenhouse, PT

## 2020-06-10 ENCOUNTER — Ambulatory Visit
Admission: RE | Admit: 2020-06-10 | Discharge: 2020-06-10 | Disposition: A | Discharge status: Still In Hospital | Payer: Medicare Other | Source: Ambulatory Visit | Attending: Orthopaedic Surgery | Admitting: Orthopaedic Surgery

## 2020-06-10 NOTE — PT Treatment (Signed)
[facility]  [place], [place] [address]     Outpatient Rehabilitation Services  Physical Therapy Progress Note    Patient Name: Toni Tran  Date of Birth: 10-30-1956  MRN: [id number]  Payor: [facility] MEDICARE / Plan: [place] / Product Type: MEDICARE [facility] /   Diagnosis:  Post operative state.  History of L total knee replacement.  Recertification Date: 16/94/50  Referring Physician:  Dr Lynelle Smoke  Next Physician Follow-up Visit: Unknown    06/10/2020 is the patient's 6th visit.    Subjective   Toni Tran reported she has come a long way, "from a wheelchair to a walker to a cane".  She is walking some in the home without an assistive device, per patient report.    Disabled prior to surgery.    Objective   L TKA 05/06/20.  Hx of stroke affecting her R side.    AROM Left Knee--20 to 97 deg (in supine)    Therapeutic Exercise: 32 min  Nustep 8 min (4 min seat back for extension, 4 min close for flexion)  HCS, HSS 3x30 sec  Knee flexion stretch 3x30 sec 8 and 4 inch steps stacked  Heel raises, toe raises x20  Step ups 6 inch step x20 L LE leading    Table  SAQ #0. X 20  Bridge over a bolster. #0.  X 20  Heel slide #0 x 20  Heel prop for extension on bolster x1 min, 2 separate trials of this  Reviewed QS for HEP     Total Treatment time:  34 min                        Assessment:   Verbal cues provided for patient's PT exercise regimen, for her program in entirety and for proper techniques with the individual exercises.  Today, we reviewed the Quad Set exercise for her home program.  Encouraged patient to work on this frequently throughout the day, to try to facilitate extension range of motion.  Updated STG this am.  Continue to progress POC per patient tolerance.    Goals:   Updated 06/10/2020  Short Term Goals:  In 2 weeks, to be achieved by 06/10/20:  Pt will be compliant with therapy sessions and HEP.  MET  Pt will have 50% recall and demo of HEP for knee ROM and quad/hamstring strengthening.  MET  Pt will  be able to ambulate household and community distances with [facility]/safely.  PROGRESS NOTED  Pt will have 0-->110 deg L knee AROM to allow for easier completion of ADL.  PROGRESS NOTED    Long Term Goals:  In 4-6 weeks: to be achieved by 07/11/20:  Pt will demonstrate 100% independence with HEP.  Pt will have 0 to 120 deg of R knee AROM to allow for easier completion of ADL.  Pt will have 5/5 painless knee strength to allow for normalization of gait pattern.  Pt will be able to ambulate household and community distances s AD.  Pt will be able to ascend/descend stairs step over step without pain.  Pt will score 75/100 or > on Lysholm Knee Scoring Scale indicating functional improvement since initial evaluation.      Plan:     PT 2-3x/week 4-6 weeks for therapeutic exercise, manual therapy, gait training, electrical stimulation, neuromuscular reeducation.    She is set up again with Hassan Rowan this week and then back to [facility] next week.  The risks/benefits of therapy have been discussed with the patient and he/she is in agreement with the established plan of care.    Therapist :     Natale Lay, PT  06/10/20 11:26

## 2020-06-13 ENCOUNTER — Ambulatory Visit (HOSPITAL_BASED_OUTPATIENT_CLINIC_OR_DEPARTMENT_OTHER): Payer: Self-pay

## 2020-06-17 ENCOUNTER — Ambulatory Visit (HOSPITAL_BASED_OUTPATIENT_CLINIC_OR_DEPARTMENT_OTHER): Payer: Self-pay

## 2020-06-19 ENCOUNTER — Ambulatory Visit (HOSPITAL_BASED_OUTPATIENT_CLINIC_OR_DEPARTMENT_OTHER): Payer: Self-pay

## 2020-08-20 ENCOUNTER — Ambulatory Visit (INDEPENDENT_AMBULATORY_CARE_PROVIDER_SITE_OTHER): Payer: Medicare Other | Admitting: Family Medicine

## 2020-08-20 ENCOUNTER — Other Ambulatory Visit: Payer: Self-pay

## 2020-08-20 ENCOUNTER — Encounter (INDEPENDENT_AMBULATORY_CARE_PROVIDER_SITE_OTHER): Payer: Self-pay | Admitting: Family Medicine

## 2020-08-20 VITALS — BP 133/78 | HR 84 | Temp 96.9°F | Ht 62.0 in | Wt 217.5 lb

## 2020-08-20 DIAGNOSIS — I10 Essential (primary) hypertension: Secondary | ICD-10-CM

## 2020-08-20 DIAGNOSIS — E785 Hyperlipidemia, unspecified: Secondary | ICD-10-CM

## 2020-08-20 DIAGNOSIS — M5030 Other cervical disc degeneration, unspecified cervical region: Secondary | ICD-10-CM

## 2020-08-20 DIAGNOSIS — G5600 Carpal tunnel syndrome, unspecified upper limb: Secondary | ICD-10-CM

## 2020-08-20 DIAGNOSIS — E559 Vitamin D deficiency, unspecified: Secondary | ICD-10-CM

## 2020-08-20 MED ORDER — LISINOPRIL 20 MG-HYDROCHLOROTHIAZIDE 12.5 MG TABLET
1.00 | ORAL_TABLET | Freq: Two times a day (BID) | ORAL | 1 refills | Status: DC
Start: 2020-08-20 — End: 2022-08-26

## 2020-08-20 MED ORDER — SERTRALINE 50 MG TABLET
50.00 mg | ORAL_TABLET | Freq: Every day | ORAL | 2 refills | Status: DC
Start: 2020-08-20 — End: 2021-09-15

## 2020-08-20 MED ORDER — QUETIAPINE 50 MG TABLET
50.0000 mg | ORAL_TABLET | Freq: Two times a day (BID) | ORAL | 2 refills | Status: DC
Start: 2020-08-20 — End: 2021-09-15

## 2020-08-20 MED ORDER — TRAZODONE 150 MG TABLET
ORAL_TABLET | ORAL | 1 refills | Status: DC
Start: 2020-08-20 — End: 2020-08-20

## 2020-08-20 MED ORDER — SIMVASTATIN 20 MG TABLET
20.00 mg | ORAL_TABLET | Freq: Every evening | ORAL | 1 refills | Status: DC
Start: 2020-08-20 — End: 2022-08-26

## 2020-08-20 MED ORDER — AMLODIPINE 10 MG TABLET
10.0000 mg | ORAL_TABLET | Freq: Every day | ORAL | 1 refills | Status: DC
Start: 2020-08-20 — End: 2022-08-26

## 2020-08-20 NOTE — Progress Notes (Signed)
[facility]  [address]  [place] [address]  Phone: [phone]  Fax: [phone]    Encounter Date: 08/20/2020    Patient ID:  Toni Tran  VOH:[id number]    DOB: 1956-05-10  Age: 64 y.o. female    Subjective:   No chief complaint on file.    HPI     63y/o obese female w/ h/o HTN, HLD, bulging disc in neck for follow up appt. Mentions Right hand numbness and feeling like arm is falling asleep. Some noted weakness that is affecting use of hand.  Not sleeping well, says trazodone doesn't help. Feels like mood is up and down alot.  Knee surgery on left done, may 25th 2021. Significant improvement prior to surgery.   Current Outpatient Medications   Medication Sig   . albuterol sulfate (PROAIR HFA) 90 mcg/actuation Inhalation HFA Aerosol Inhaler Take 1-2 Puffs by inhalation Every 6 hours as needed W/ spacer   . amLODIPine (NORVASC) 10 mg Oral Tablet Take 1 Tablet (10 mg total) by mouth Once a day AMLODIPINE BESYLATE   . HYDROcodone-acetaminophen (NORCO) 7.5-325 mg Oral Tablet Take 2 Tablets by mouth Every 6 hours as needed   . lisinopriL-hydrochlorothiazide (ZESTORETIC) 20-12.5 mg Oral Tablet Take 1 Tablet by mouth Twice daily   . mupirocin (BACTROBAN) 2 % Ointment by Apply Topically route Three times a day (Patient not taking: Reported on 05/06/2020)   . QUEtiapine (SEROQUEL) 50 mg Oral Tablet Take 1 Tablet (50 mg total) by mouth Twice daily   . sertraline (ZOLOFT) 50 mg Oral Tablet Take 1 Tablet (50 mg total) by mouth Once a day   . simvastatin (ZOCOR) 20 mg Oral Tablet Take 1 Tablet (20 mg total) by mouth Every evening   . VENTOLIN HFA 90 mcg/actuation Inhalation HFA Aerosol Inhaler Take 2 Puffs by inhalation Every 6 hours as needed     No Known Allergies  Past Medical History:   Diagnosis Date   . Anxiety    . 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 daily   . Chronic pain     right foot   . 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 Depression    . History of anesthesia complications     Required overnight stay after tarsal tunnel release d/t hypoxia per pt,    . Hypercholesterolemia    . Hyperlipidemia    . Hypertension    . MRSA infection    . Obesity    . Peripheral edema     right foot   . Peripheral neuropathy     Right foot s/p tarsal tunnel release   . Plantar fascial fibromatosis of right foot    . PONV (postoperative nausea and vomiting)     requests Anti Emetic   . RLS (restless legs syndrome)    . Snores     Never has had sleep study   . Tarsal tunnel syndrome, right    . Vitamin D deficiency    . Wears glasses          Past Surgical History:   Procedure Laterality Date   . HX APPENDECTOMY  childhood   . HX EYE SURGERY Bilateral Childhood    Muscle Surgery   . HX FOOT SURGERY Right 06/07/2017    Tarsal Tunnel release; FASCIOTOMY PLANTAR   . HX  SHOULDER SURGERY Right 06/2016    Exc. Bone Spur, Repair Rotator Cuff Tear   . HX TUBAL LIGATION  1992         Family Medical History:     Problem Relation (Age of Onset)    Alzheimer's/Dementia Mother, Father    Arthritis-osteo Mother, Father    Blood Clots Father    Congestive Heart Failure Father    Coronary Artery Disease Father    Diabetes Father    Heart Attack Mother, Father    High Cholesterol Mother, Father    Hypertension (High Blood Pressure) Mother, Father    Migraines Mother, Father    Sleep disorders Father    Stroke Mother, Father            Social History     Tobacco Use   . Smoking status: Never Smoker   . Smokeless tobacco: Never Used   Substance Use Topics   . Alcohol use: No   . Drug use: No       Review of Systems   Constitutional: Negative.  Negative for appetite change, chills, diaphoresis, fatigue and fever.   HENT: Negative.  Negative for dental problem, drooling, ear discharge, ear pain, facial swelling, hearing loss, mouth sores, postnasal drip, rhinorrhea, sinus pressure, sinus pain,  sneezing, sore throat and tinnitus.    Eyes: Negative.  Negative for pain, redness and itching.   Respiratory: Negative.  Negative for cough, choking, chest tightness, shortness of breath and stridor.    Cardiovascular: Negative.    Gastrointestinal: Negative.  Negative for abdominal pain, anal bleeding, blood in stool, constipation, diarrhea, nausea and rectal pain.   Endocrine: Negative for heat intolerance, polydipsia and polyphagia.   Genitourinary: Negative for dyspareunia, dysuria, enuresis, flank pain, frequency, genital sores, hematuria, menstrual problem, pelvic pain, vaginal bleeding, vaginal discharge and vaginal pain.   Musculoskeletal: Positive for arthralgias, back pain and neck pain. Negative for gait problem and joint swelling.   Skin: Negative for pallor and rash.   Neurological: Negative.  Negative for tremors, seizures, syncope, facial asymmetry, speech difficulty, light-headedness, numbness and headaches.   Psychiatric/Behavioral: Positive for sleep disturbance. Negative for confusion, decreased concentration, dysphoric mood and hallucinations. The patient is nervous/anxious.      Objective:   Vitals: BP 133/78   Pulse 84   Temp 36.1 C (96.9 F)   Ht 1.575 m (5\' 2" )   Wt 98.7 kg (217 lb 8 oz)   LMP  (LMP Unknown)   SpO2 98%   BMI 39.78 kg/m         Physical Exam  Constitutional:       Appearance: Normal appearance.   HENT:      Head: Normocephalic and atraumatic.      Right Ear: Tympanic membrane normal.      Left Ear: Tympanic membrane normal.      Nose: Nose normal.      Mouth/Throat:      Mouth: Mucous membranes are moist.   Eyes:      Extraocular Movements: Extraocular movements intact.      Conjunctiva/sclera: Conjunctivae normal.   Cardiovascular:      Rate and Rhythm: Normal rate and regular rhythm.      Pulses: Normal pulses.      Heart sounds: Normal heart sounds.   Pulmonary:      Effort: Pulmonary effort is normal.      Breath sounds: Normal breath sounds.   Abdominal:       General: Bowel sounds are  normal.      Palpations: Abdomen is soft.   Musculoskeletal:      Cervical back: Normal range of motion and neck supple.   Neurological:      General: No focal deficit present.      Mental Status: She is alert and oriented to person, place, and time.   Psychiatric:         Behavior: Behavior normal.         Thought Content: Thought content normal.         Judgment: Judgment normal.         Assessment & Plan:     ENCOUNTER DIAGNOSES     ICD-10-CM   1. Hypertension, unspecified type  I10   2. Carpal tunnel syndrome, unspecified laterality  G56.00   3. DDD (degenerative disc disease), cervical  M50.30   4. Hyperlipidemia, unspecified hyperlipidemia type  E78.5   5. Vitamin D deficiency  E55.9     labwork ordered  Med refills  Ortho referral  Neurosurgery referrral  Start seroquel  Discontinue trazodone  Low carb diet, incr exercise and sleep  Orders Placed This Encounter   . LIPID PANEL   . Refer to External Provider   . simvastatin (ZOCOR) 20 mg Oral Tablet   . sertraline (ZOLOFT) 50 mg Oral Tablet   . lisinopriL-hydrochlorothiazide (ZESTORETIC) 20-12.5 mg Oral Tablet   . amLODIPine (NORVASC) 10 mg Oral Tablet   . QUEtiapine (SEROQUEL) 50 mg Oral Tablet       No follow-ups on file.    [name], MD

## 2020-08-28 ENCOUNTER — Ambulatory Visit (INDEPENDENT_AMBULATORY_CARE_PROVIDER_SITE_OTHER): Payer: Medicare Other | Admitting: Family Medicine

## 2020-08-28 VITALS — BP 124/88 | HR 80 | Temp 98.7°F | Ht 63.0 in | Wt 212.0 lb

## 2020-08-28 DIAGNOSIS — Z7189 Other specified counseling: Secondary | ICD-10-CM

## 2020-08-28 DIAGNOSIS — Z1331 Encounter for screening for depression: Secondary | ICD-10-CM

## 2020-08-28 DIAGNOSIS — Z136 Encounter for screening for cardiovascular disorders: Secondary | ICD-10-CM

## 2020-08-28 DIAGNOSIS — Z0000 Encounter for general adult medical examination without abnormal findings: Secondary | ICD-10-CM

## 2020-08-28 DIAGNOSIS — Z0189 Encounter for other specified special examinations: Secondary | ICD-10-CM

## 2020-08-28 DIAGNOSIS — Z1389 Encounter for screening for other disorder: Secondary | ICD-10-CM

## 2020-08-29 ENCOUNTER — Other Ambulatory Visit: Payer: Medicare Other | Attending: Family Medicine

## 2020-08-29 DIAGNOSIS — Z0000 Encounter for general adult medical examination without abnormal findings: Secondary | ICD-10-CM | POA: Insufficient documentation

## 2020-08-29 LAB — URINALYSIS, MACROSCOPIC
BILIRUBIN: NEGATIVE mg/dL
BLOOD: NEGATIVE mg/dL
GLUCOSE: NORMAL mg/dL
KETONES: NEGATIVE mg/dL
NITRITE: NEGATIVE
PH: 6 (ref 5.0–7.5)
PROTEIN: 15 mg/dL — AB
SPECIFIC GRAVITY: 1.01 (ref 1.005–1.020)
UROBILINOGEN: 1 mg/dL — AB (ref <=2.0)

## 2020-08-29 LAB — URINALYSIS, MICROSCOPIC

## 2020-08-31 LAB — URINE CULTURE,ROUTINE: URINE CULTURE: 10000 — AB

## 2020-09-07 ENCOUNTER — Encounter (INDEPENDENT_AMBULATORY_CARE_PROVIDER_SITE_OTHER): Payer: Self-pay | Admitting: Family Medicine

## 2020-09-07 NOTE — Progress Notes (Addendum)
[facility]  [address]  [place] [address]  Phone: [phone]  Fax: [phone]    Encounter Date: 08/28/2020    Patient ID:  Toni Tran  SNK:[id number]    DOB: 22-Nov-1956  Age: 64 y.o. female    Subjective:     Chief Complaint   Patient presents with    Follow-up     WELLNESS     HPI   63y/o obese female here for AWV. Stable.  BMI-37.55  Last lipid 08/20/2020  Med list see attached  Mobility: wnl  Mental status AA+O X 3  Hearing test: finger rub normal, no hearing aids  Eye care:   Dental: dentures/ hasn't worn them, apr 2021 last appt  Fall Risk: none  Code status:DNR, DNI  Health surrogate decision maker:D[facility] Scott/Fiance- Crystal Neal/Daughter  Advanced care planning discussed on this visit.and paperwork handed to pt.  Current Outpatient Medications   Medication Sig    albuterol sulfate (PROAIR HFA) 90 mcg/actuation Inhalation HFA Aerosol Inhaler Take 1-2 Puffs by inhalation Every 6 hours as needed W/ spacer    amLODIPine (NORVASC) 10 mg Oral Tablet Take 1 Tablet (10 mg total) by mouth Once a day AMLODIPINE BESYLATE    HYDROcodone-acetaminophen (NORCO) 7.5-325 mg Oral Tablet Take 2 Tablets by mouth Every 6 hours as needed    lisinopriL-hydrochlorothiazide (ZESTORETIC) 20-12.5 mg Oral Tablet Take 1 Tablet by mouth Twice daily    mupirocin (BACTROBAN) 2 % Ointment by Apply Topically route Three times a day (Patient not taking: Reported on 05/06/2020)    QUEtiapine (SEROQUEL) 50 mg Oral Tablet Take 1 Tablet (50 mg total) by mouth Twice daily    sertraline (ZOLOFT) 50 mg Oral Tablet Take 1 Tablet (50 mg total) by mouth Once a day    simvastatin (ZOCOR) 20 mg Oral Tablet Take 1 Tablet (20 mg total) by mouth Every evening    VENTOLIN HFA 90 mcg/actuation Inhalation HFA Aerosol Inhaler Take 2 Puffs by inhalation Every 6 hours as needed     No Known Allergies  Past Medical History:   Diagnosis Date    Anxiety     Blood thinned due to long-term  anticoagulant use     Aspirin 325mg . 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, right sided weakness    Depression     History of anesthesia complications     Required overnight stay after tarsal tunnel release d/t hypoxia per pt,     Hypercholesterolemia     Hyperlipidemia     Hypertension     MRSA infection     Obesity     Peripheral edema     right foot    Peripheral neuropathy     Right foot s/p tarsal tunnel release    Plantar fascial fibromatosis of right foot     PONV (postoperative nausea and vomiting)     requests Anti Emetic    RLS (restless legs syndrome)     Snores     Never has had sleep study    Tarsal tunnel syndrome, right     Vitamin D deficiency     Wears glasses          Past Surgical History:   Procedure Laterality Date    HX APPENDECTOMY  childhood    HX EYE SURGERY Bilateral Childhood  Muscle Surgery    HX FOOT SURGERY Right 06/07/2017    Tarsal Tunnel release; FASCIOTOMY PLANTAR    HX SHOULDER SURGERY Right 06/2016    Exc. Bone Spur, Repair Rotator Cuff Tear    HX TUBAL LIGATION  1992         Family Medical History:     Problem Relation (Age of Onset)    Alzheimer's/Dementia Mother, Father    Arthritis-osteo Mother, Father    Blood Clots Father    Congestive Heart Failure Father    Coronary Artery Disease Father    Diabetes Father    Heart Attack Mother, Father    High Cholesterol Mother, Father    Hypertension (High Blood Pressure) Mother, Father    Migraines Mother, Father    Sleep disorders Father    Stroke Mother, Father            Social History     Tobacco Use    Smoking status: Never Smoker    Smokeless tobacco: Never Used   Substance Use Topics    Alcohol use: No    Drug use: No       Review of Systems  Objective:   Vitals: BP 124/88    Pulse 80    Temp 37.1 C (98.7 F)    Ht 1.6 m (5\' 3" )    Wt 96.2 kg (212 lb)    LMP  (LMP Unknown)    SpO2 98%    BMI 37.55  kg/m         Physical Exam    Assessment & Plan:     ENCOUNTER DIAGNOSES     ICD-10-CM   1. Initial Medicare annual wellness visit  Z00.00   2. Depression screening  Z13.31   3. Obesity screen  Z13.89   4. Screening for cardiovascular condition  Z13.6   5. Encounter for tobacco use screening  Z01.89   6. Advanced care planning/counseling discussion  Z71.89     Urinalysis ordered  EKG ordered  medicare annual wellness visit done.  Health risk assessment done, pt medical records reviewed.   Depression screening done and slight positive will schedule appt to further discuss  Alcohol screening done w/ CAGE score of 0, Obesity screen positive w/ BMI of 37.55    End of life care discussed:    Pt was deemed competent to make decisions. Patient agrees to discuss advance care planning. Pt preferences for aggressive medical care and alternatives to aggressive medical care were discussed. These included potential for hospitalization and how to avoid hospitalization. Total time spent related to chart review, education and face to face discussion and documentation was .     Orders Placed This Encounter    URINALYSIS WITH CULTURE REFLEX IF INDICATED [facility] ONLY    ECG 12-LEAD       No follow-ups on file.    , MD

## 2020-11-21 LAB — COLOGUARD® COLON CANCER SCREEN

## 2020-11-25 ENCOUNTER — Encounter (INDEPENDENT_AMBULATORY_CARE_PROVIDER_SITE_OTHER): Payer: Medicare Other | Admitting: Family Medicine

## 2020-12-02 ENCOUNTER — Ambulatory Visit: Payer: Medicare Other | Attending: Family Medicine

## 2020-12-02 ENCOUNTER — Other Ambulatory Visit: Payer: Self-pay

## 2020-12-02 ENCOUNTER — Telehealth (INDEPENDENT_AMBULATORY_CARE_PROVIDER_SITE_OTHER): Payer: Medicare Other | Admitting: Family Medicine

## 2020-12-02 DIAGNOSIS — I10 Essential (primary) hypertension: Secondary | ICD-10-CM

## 2020-12-02 DIAGNOSIS — Z20822 Contact with and (suspected) exposure to covid-19: Secondary | ICD-10-CM

## 2020-12-02 DIAGNOSIS — J069 Acute upper respiratory infection, unspecified: Secondary | ICD-10-CM

## 2020-12-02 LAB — COVID-19 PANTHER - LAB USE ONLY: SARS-CoV-2: NOT DETECTED

## 2020-12-02 NOTE — Progress Notes (Signed)
[facility]  [address]  [place] [address]  Phone: [phone]  Fax: [phone]    Encounter Date: 12/02/2020    Patient ID:  Toni Tran  GQQ:[id number]    DOB: 11-Jan-1956  Age: 64 y.o. female    Subjective:   No chief complaint on file.    HPI   virtual  64y/o female w/ h/o HTN for follow up appt. Mentions Malaise, nausea, grand daughter positive for covid. Would like to get tested since exposed.   Current Outpatient Medications   Medication Sig    albuterol sulfate (PROAIR HFA) 90 mcg/actuation Inhalation HFA Aerosol Inhaler Take 1-2 Puffs by inhalation Every 6 hours as needed W/ spacer    amLODIPine (NORVASC) 10 mg Oral Tablet Take 1 Tablet (10 mg total) by mouth Once a day AMLODIPINE BESYLATE    HYDROcodone-acetaminophen (NORCO) 7.5-325 mg Oral Tablet Take 2 Tablets by mouth Every 6 hours as needed    lisinopriL-hydrochlorothiazide (ZESTORETIC) 20-12.5 mg Oral Tablet Take 1 Tablet by mouth Twice daily    mupirocin (BACTROBAN) 2 % Ointment by Apply Topically route Three times a day (Patient not taking: Reported on 05/06/2020)    QUEtiapine (SEROQUEL) 50 mg Oral Tablet Take 1 Tablet (50 mg total) by mouth Twice daily    sertraline (ZOLOFT) 50 mg Oral Tablet Take 1 Tablet (50 mg total) by mouth Once a day    simvastatin (ZOCOR) 20 mg Oral Tablet Take 1 Tablet (20 mg total) by mouth Every evening    VENTOLIN HFA 90 mcg/actuation Inhalation HFA Aerosol Inhaler Take 2 Puffs by inhalation Every 6 hours as needed     No Known Allergies  Past Medical History:   Diagnosis Date    Anxiety     Blood 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, right sided weakness    Depression     History of anesthesia complications     Required overnight stay after tarsal tunnel release d/t hypoxia per pt,      Hypercholesterolemia     Hyperlipidemia     Hypertension     MRSA infection     Obesity     Peripheral edema     right foot    Peripheral neuropathy     Right foot s/p tarsal tunnel release    Plantar fascial fibromatosis of right foot     PONV (postoperative nausea and vomiting)     requests Anti Emetic    RLS (restless legs syndrome)     Snores     Never has had sleep study    Tarsal tunnel syndrome, right     Vitamin D deficiency     Wears glasses          Past Surgical History:   Procedure Laterality Date    HX APPENDECTOMY  childhood    HX EYE SURGERY Bilateral Childhood    Muscle Surgery    HX FOOT SURGERY Right 06/07/2017    Tarsal Tunnel release; FASCIOTOMY PLANTAR    HX SHOULDER SURGERY Right 06/2016    Exc. Bone Spur, Repair Rotator Cuff Tear    HX TUBAL LIGATION  1992         Family Medical History:     Problem Relation (Age of  Onset)    Alzheimer's/Dementia Mother, Father    Arthritis-osteo Mother, Father    Blood Clots Father    Congestive Heart Failure Father    Coronary Artery Disease Father    Diabetes Father    Heart Attack Mother, Father    High Cholesterol Mother, Father    Hypertension (High Blood Pressure) Mother, Father    Migraines Mother, Father    Sleep disorders Father    Stroke Mother, Father          Social History     Tobacco Use    Smoking status: Never Smoker    Smokeless tobacco: Never Used   Substance Use Topics    Alcohol use: No    Drug use: No       Review of Systems   Constitutional: Negative for appetite change, chills, diaphoresis, fatigue and fever.   HENT: Negative for dental problem, drooling, ear discharge, ear pain, facial swelling, hearing loss, mouth sores, nosebleeds, postnasal drip, rhinorrhea, sinus pressure, sinus pain, sneezing, sore throat, tinnitus, trouble swallowing and voice change.    Eyes: Negative for photophobia, pain, redness and itching.   Respiratory: Negative for cough, choking, chest tightness, shortness of breath and stridor.     Cardiovascular: Negative for palpitations and leg swelling.   Gastrointestinal: Negative for abdominal pain, anal bleeding, blood in stool, constipation, diarrhea, nausea, rectal pain and vomiting.   Endocrine: Negative for heat intolerance, polydipsia and polyphagia.   Genitourinary: Negative for decreased urine volume, dyspareunia, dysuria, enuresis, flank pain, frequency, genital sores, hematuria, menstrual problem, pelvic pain, urgency, vaginal bleeding, vaginal discharge and vaginal pain.   Musculoskeletal: Negative for back pain, gait problem, joint swelling, myalgias, neck pain and neck stiffness.   Skin: Negative for pallor, rash and wound.   Allergic/Immunologic: Negative for food allergies and immunocompromised state.   Neurological: Negative for tremors, seizures, syncope, facial asymmetry, speech difficulty, weakness, light-headedness, numbness and headaches.   Hematological: Negative for adenopathy. Does not bruise/bleed easily.   Psychiatric/Behavioral: Negative for behavioral problems, confusion, decreased concentration, dysphoric mood, hallucinations, self-injury, sleep disturbance and suicidal ideas. The patient is not nervous/anxious and is not hyperactive.      Objective:   Vitals: LMP  (LMP Unknown)         Physical Exam  Constitutional:       Appearance: Normal appearance. She is obese.      Comments: Fatigue appearing   HENT:      Head: Normocephalic and atraumatic.   Neurological:      General: No focal deficit present.      Mental Status: She is alert and oriented to person, place, and time.   Psychiatric:         Behavior: Behavior normal.         Thought Content: Thought content normal.         Judgment: Judgment normal.         Assessment & Plan:     ENCOUNTER DIAGNOSES     ICD-10-CM   1. Upper respiratory tract infection, unspecified type  J06.9   2. Exposure to COVID-19 virus  Z20.822   3. Hypertension, unspecified type  I10     covid screening test  Low carb diet, incr exercise and  sleep    Orders Placed This Encounter    COVID-19 SCREENING - OUTPATIENT ([facility])       No follow-ups on file.    [name], MD

## 2021-02-10 ENCOUNTER — Other Ambulatory Visit (HOSPITAL_COMMUNITY): Payer: Self-pay | Admitting: Family Medicine

## 2021-02-10 DIAGNOSIS — Z1231 Encounter for screening mammogram for malignant neoplasm of breast: Secondary | ICD-10-CM

## 2021-02-11 ENCOUNTER — Other Ambulatory Visit (HOSPITAL_COMMUNITY): Payer: Self-pay | Admitting: Family Medicine

## 2021-02-11 DIAGNOSIS — R0683 Snoring: Secondary | ICD-10-CM

## 2021-02-11 DIAGNOSIS — G4710 Hypersomnia, unspecified: Secondary | ICD-10-CM

## 2021-02-12 ENCOUNTER — Ambulatory Visit
Admission: RE | Admit: 2021-02-12 | Discharge: 2021-02-12 | Disposition: A | Discharge status: Home / Self Care | Payer: Medicare Other | Source: Ambulatory Visit | Attending: Family Medicine | Admitting: Family Medicine

## 2021-02-12 ENCOUNTER — Encounter (HOSPITAL_COMMUNITY): Payer: Self-pay

## 2021-02-12 ENCOUNTER — Ambulatory Visit (HOSPITAL_COMMUNITY): Payer: Medicare Other

## 2021-02-12 ENCOUNTER — Other Ambulatory Visit: Payer: Self-pay

## 2021-02-12 DIAGNOSIS — E7849 Other hyperlipidemia: Secondary | ICD-10-CM

## 2021-02-12 DIAGNOSIS — Z0000 Encounter for general adult medical examination without abnormal findings: Secondary | ICD-10-CM | POA: Insufficient documentation

## 2021-02-12 DIAGNOSIS — I10 Essential (primary) hypertension: Secondary | ICD-10-CM | POA: Insufficient documentation

## 2021-02-12 DIAGNOSIS — G4710 Hypersomnia, unspecified: Secondary | ICD-10-CM

## 2021-02-12 DIAGNOSIS — R0683 Snoring: Secondary | ICD-10-CM

## 2021-02-12 LAB — CBC WITH DIFF
BASOPHIL #: <0.1 10*3/uL (ref <=0.20)
BASOPHIL %: 1 %
EOSINOPHIL #: 0.2 10*3/uL (ref <=0.50)
EOSINOPHIL %: 2 %
HCT: 40.4 % (ref 34.8–46.0)
HGB: 14.1 g/dL (ref 11.5–16.0)
IMMATURE GRANULOCYTE #: <0.1 10*3/uL (ref <0.10)
IMMATURE GRANULOCYTE %: 1 % (ref 0–1)
LYMPHOCYTE #: 1.86 10*3/uL (ref 1.00–4.80)
LYMPHOCYTE %: 21 %
MCH: 32.6 pg — ABNORMAL HIGH (ref 26.0–32.0)
MCHC: 34.9 g/dL (ref 31.0–35.5)
MCV: 93.3 fL (ref 78.0–100.0)
MONOCYTE #: 0.67 10*3/uL (ref 0.20–1.10)
MONOCYTE %: 7 %
MPV: 11.4 fL (ref 8.7–12.5)
NEUTROPHIL #: 6.18 10*3/uL (ref 1.50–7.70)
NEUTROPHIL %: 68 %
PLATELETS: 184 10*3/uL (ref 150–400)
RBC: 4.33 10*6/uL (ref 3.85–5.22)
RDW-CV: 12 % (ref 11.5–15.5)
WBC: 9 10*3/uL (ref 3.7–11.0)

## 2021-02-12 LAB — COMPREHENSIVE METABOLIC PNL, FASTING
ALBUMIN: 4.1 g/dL (ref 3.4–4.8)
ALKALINE PHOSPHATASE: 96 U/L (ref 50–130)
ALT (SGPT): 24 U/L — ABNORMAL HIGH (ref 8–22)
ANION GAP: 10 mmol/L (ref 4–13)
AST (SGOT): 19 U/L (ref 8–45)
BILIRUBIN TOTAL: 0.8 mg/dL (ref 0.3–1.3)
BUN/CREA RATIO: 17 (ref 6–22)
BUN: 14 mg/dL (ref 8–25)
CALCIUM: 10.4 mg/dL — ABNORMAL HIGH (ref 8.8–10.2)
CHLORIDE: 104 mmol/L (ref 96–111)
CO2 TOTAL: 24 mmol/L (ref 23–31)
CREATININE: 0.83 mg/dL (ref 0.60–1.05)
ESTIMATED GFR: 74 mL/min/BSA (ref >=60)
GLUCOSE: 149 mg/dL — ABNORMAL HIGH (ref 70–99)
POTASSIUM: 3.8 mmol/L (ref 3.5–5.1)
PROTEIN TOTAL: 7.5 g/dL (ref 6.0–8.0)
SODIUM: 138 mmol/L (ref 136–145)

## 2021-02-12 LAB — LIPID PANEL
CHOL/HDL RATIO: 2.9
CHOLESTEROL: 193 mg/dL (ref 100–200)
HDL CHOL: 66 mg/dL (ref >=50)
LDL CALC: 101 mg/dL — ABNORMAL HIGH (ref <100)
NON-HDL: 127 mg/dL (ref <=190)
TRIGLYCERIDES: 132 mg/dL (ref <150)
VLDL CALC: 26 mg/dL (ref <30)

## 2021-02-12 LAB — THYROID STIMULATING HORMONE (SENSITIVE TSH): TSH: 3.647 u[IU]/mL — ABNORMAL HIGH (ref 0.430–3.550)

## 2021-02-12 NOTE — Ancillary Notes (Signed)
Neurodiagnostic Lab Note      Toni Tran was educated on how to use the unattended sleep study device. Device was sent home with patient to complete the study tonight 02/12/2021 and told to return the device tomorrow morning.      Melba Coon, [facility]  02/12/2021, 08:04

## 2021-02-17 ENCOUNTER — Other Ambulatory Visit: Payer: Self-pay

## 2021-02-17 ENCOUNTER — Encounter (HOSPITAL_BASED_OUTPATIENT_CLINIC_OR_DEPARTMENT_OTHER): Payer: Self-pay

## 2021-02-17 ENCOUNTER — Ambulatory Visit
Admission: RE | Admit: 2021-02-17 | Discharge: 2021-02-17 | Disposition: A | Discharge status: Home / Self Care | Payer: Medicare Other | Source: Ambulatory Visit | Attending: Family Medicine | Admitting: Family Medicine

## 2021-02-17 DIAGNOSIS — Z1231 Encounter for screening mammogram for malignant neoplasm of breast: Secondary | ICD-10-CM

## 2021-02-20 NOTE — Procedures (Signed)
Toni Tran, Toni Tran   HOSPITAL NUMBER:  [id number]  DATE OF SERVICE:  02/12/2021  DOB:  Mar 10, 1956  SEX:  F      NAME OF STUDY:  Unattended polysomnogram.    PRIMARY CARE PHYSICIANS:  Georgette Dover, MD, and [name], MD.    INDICATIONS FOR TESTING:  Excessive daytime sleepiness and snoring.    DESCRIPTION OF STUDY:  The patient was studied using a WatchPAT system on February 12, 2021, for 7 hours and 29 minutes, 5 hours and 52 minutes of which were asleep.  The patient had a sleep onset of 20 minutes and a sleep efficiency of 79%.  Analysis of sleep architecture demonstrated 15% REM, 70% light, and 15% deep sleep.  The patient had episodes of sleep-disordered breathing.  The Apnea-Hypopnea Index average for the night was 20.3.  The index during REM sleep was 57.8.  The minimum oxygen saturation was 87%.  The patient had 0.6 minutes with oxygen saturation less than 89%.    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    RECOMMENDATION:  CPAP titration study.        Lillia Dallas, MD            DD:  02/19/2021 13:56:35  DT:  02/20/2021 08:23:21 DG  D#:  [id number]

## 2021-06-03 ENCOUNTER — Ambulatory Visit: Payer: Medicare Other | Attending: Family Medicine

## 2021-06-03 ENCOUNTER — Other Ambulatory Visit: Payer: Self-pay

## 2021-06-03 DIAGNOSIS — I10 Essential (primary) hypertension: Secondary | ICD-10-CM | POA: Insufficient documentation

## 2021-06-03 DIAGNOSIS — R739 Hyperglycemia, unspecified: Secondary | ICD-10-CM | POA: Insufficient documentation

## 2021-06-03 LAB — BASIC METABOLIC PANEL
ANION GAP: 7 mmol/L (ref 4–13)
BUN/CREA RATIO: 14 (ref 6–22)
BUN: 14 mg/dL (ref 8–25)
CALCIUM: 9.3 mg/dL (ref 8.8–10.2)
CHLORIDE: 104 mmol/L (ref 96–111)
CO2 TOTAL: 27 mmol/L (ref 23–31)
CREATININE: 1.01 mg/dL (ref 0.60–1.05)
ESTIMATED GFR: 62 mL/min/BSA (ref >=60)
GLUCOSE: 126 mg/dL — ABNORMAL HIGH (ref 65–125)
POTASSIUM: 4 mmol/L (ref 3.5–5.1)
SODIUM: 138 mmol/L (ref 136–145)

## 2021-06-03 LAB — HGA1C (HEMOGLOBIN A1C WITH EST AVG GLUCOSE)
ESTIMATED AVERAGE GLUCOSE: 97 mg/dL
HEMOGLOBIN A1C: 5 % (ref 4.0–5.6)

## 2021-09-01 ENCOUNTER — Encounter (INDEPENDENT_AMBULATORY_CARE_PROVIDER_SITE_OTHER): Payer: Medicare Other | Admitting: Family Medicine

## 2021-09-15 ENCOUNTER — Emergency Department (HOSPITAL_COMMUNITY): Payer: Medicare Other

## 2021-09-15 ENCOUNTER — Other Ambulatory Visit: Payer: Self-pay

## 2021-09-15 ENCOUNTER — Emergency Department
Admission: EM | Admit: 2021-09-15 | Discharge: 2021-09-15 | Disposition: A | Discharge status: Home / Self Care | Payer: Medicare Other | Attending: Emergency Medicine | Admitting: Emergency Medicine

## 2021-09-15 DIAGNOSIS — Z96652 Presence of left artificial knee joint: Secondary | ICD-10-CM | POA: Insufficient documentation

## 2021-09-15 DIAGNOSIS — M1610 Unilateral primary osteoarthritis, unspecified hip: Secondary | ICD-10-CM | POA: Insufficient documentation

## 2021-09-15 DIAGNOSIS — Z23 Encounter for immunization: Secondary | ICD-10-CM | POA: Insufficient documentation

## 2021-09-15 DIAGNOSIS — M25512 Pain in left shoulder: Secondary | ICD-10-CM | POA: Insufficient documentation

## 2021-09-15 DIAGNOSIS — W010XXA Fall on same level from slipping, tripping and stumbling without subsequent striking against object, initial encounter: Secondary | ICD-10-CM | POA: Insufficient documentation

## 2021-09-15 MED ORDER — HYDROMORPHONE 1 MG/ML INJECTION WRAPPER
1.0000 mg | INJECTION | INTRAMUSCULAR | Status: AC
Start: 2021-09-15 — End: 2021-09-15

## 2021-09-15 MED ORDER — HYDROMORPHONE 1 MG/ML INJECTION WRAPPER
INJECTION | INTRAMUSCULAR | Status: AC
Start: 2021-09-15 — End: 2021-09-15
  Administered 2021-09-15: 1 mg via INTRAVENOUS
  Filled 2021-09-15: qty 1

## 2021-09-15 MED ORDER — HYDROCODONE 5 MG-ACETAMINOPHEN 325 MG TABLET
1.0000 | ORAL_TABLET | Freq: Four times a day (QID) | ORAL | 0 refills | Status: DC | PRN
Start: 2021-09-15 — End: 2023-02-24

## 2021-09-15 MED ORDER — NALOXONE 4 MG/ACTUATION NASAL SPRAY
1.0000 | NASAL | 2 refills | Status: DC | PRN
Start: 2021-09-15 — End: 2023-06-21

## 2021-09-15 MED ORDER — KETOROLAC 30 MG/ML (1 ML) INJECTION SOLUTION
15.0000 mg | INTRAMUSCULAR | Status: AC
Start: 2021-09-15 — End: 2021-09-15
  Administered 2021-09-15: 15 mg via INTRAVENOUS
  Filled 2021-09-15: qty 1

## 2021-09-15 MED ORDER — DIPHTH,PERTUSSIS(ACELL),TETANUS 2.5 LF UNIT-8 MCG-5 LF/0.5 ML IM SUSP
0.5000 mL | INTRAMUSCULAR | Status: AC
Start: 2021-09-15 — End: 2021-09-15
  Administered 2021-09-15: 0.5 mL via INTRAMUSCULAR
  Filled 2021-09-15: qty 0.5

## 2021-09-15 NOTE — ED Provider Notes (Signed)
Marianna Fuss DO  [facility]  Emergency Department Visit Note    Date of Service: 09/15/2021  Primary Care Doctor: Georgette Dover, MD  Patient information was obtained from patient.  History/Exam limitations: none.  Patient presented to the Emergency Department by ambulance    Chief Complaint: Shoulder pain    HPI:  The patient is a/an 65 y.o. female who presents to the Emergency Department with shoulder pain. The patient states that her cat threw up a hair bag and didn't see it and stepped on it. The patient states she fell on her left shoulder and left knee and now is having 10/10 pain with movement of the left shoulder. The patient denies loss of sensation, numbness, tingling, loss of consciousness, hitting head, and neck pain.    Review of Systems:  The pertinent positive and negative symptoms are as per the HPI. They are otherwise negative as reviewed.    Past Medical History:  Past Medical History:   Diagnosis Date   . Anxiety    . 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 daily   . Chronic pain     right foot   . 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 Depression    . History of anesthesia complications     Required overnight stay after tarsal tunnel release d/t hypoxia per pt,    . Hypercholesterolemia    . Hyperlipidemia    . Hypertension    . MRSA infection    . Obesity    . Peripheral edema     right foot   . Peripheral neuropathy     Right foot s/p tarsal tunnel release   . Plantar fascial fibromatosis of right foot    . PONV (postoperative nausea and vomiting)     requests Anti Emetic   . RLS (restless legs syndrome)    . Snores     Never has had sleep study   . Tarsal tunnel syndrome, right    . Vitamin D deficiency    . Wears glasses            Past Surgical History:  Past Surgical History:   Procedure Laterality Date   . HX APPENDECTOMY  childhood   . HX EYE SURGERY Bilateral  Childhood    Muscle Surgery   . HX FOOT SURGERY Right 06/07/2017    Tarsal Tunnel release; FASCIOTOMY PLANTAR   . HX SHOULDER SURGERY Right 06/2016    Exc. Bone Spur, Repair Rotator Cuff Tear   . HX TUBAL LIGATION  1992           Family History:  Family Medical History:     Problem Relation (Age of Onset)    Alzheimer's/Dementia Mother, Father    Arthritis-osteo Mother, Father    Blood Clots Father    Congestive Heart Failure Father    Coronary Artery Disease Father    Diabetes Father    Heart Attack Mother, Father    High Cholesterol Mother, Father    Hypertension (High Blood Pressure) Mother, Father    Migraines Mother, Father    Sleep disorders Father    Stroke Mother, Father            Social History:  Social History     Tobacco Use   . Smoking status: Never Smoker   . Smokeless tobacco: Never Used  Substance Use Topics   . Alcohol use: No   . Drug use: No       Social History     Substance and Sexual Activity   Drug Use No       Current Outpatient Medications:   Previous Medications    ALBUTEROL SULFATE (PROAIR HFA) 90 MCG/ACTUATION INHALATION HFA AEROSOL INHALER    Take 1-2 Puffs by inhalation Every 6 hours as needed W/ spacer    AMLODIPINE (NORVASC) 10 MG ORAL TABLET    Take 1 Tablet (10 mg total) by mouth Once a day AMLODIPINE BESYLATE    HYDROCODONE-ACETAMINOPHEN (NORCO) 7.5-325 MG ORAL TABLET    Take 2 Tablets by mouth Every 6 hours as needed    LISINOPRIL-HYDROCHLOROTHIAZIDE (ZESTORETIC) 20-12.5 MG ORAL TABLET    Take 1 Tablet by mouth Twice daily    MUPIROCIN (BACTROBAN) 2 % OINTMENT    by Apply Topically route Three times a day    QUETIAPINE (SEROQUEL) 50 MG ORAL TABLET    Take 1 Tablet (50 mg total) by mouth Twice daily    SERTRALINE (ZOLOFT) 50 MG ORAL TABLET    Take 1 Tablet (50 mg total) by mouth Once a day    SIMVASTATIN (ZOCOR) 20 MG ORAL TABLET    Take 1 Tablet (20 mg total) by mouth Every evening    VENTOLIN HFA 90 MCG/ACTUATION INHALATION HFA AEROSOL INHALER    Take 2 Puffs by inhalation Every  6 hours as needed       Allergies:   No Known Allergies    Physical Exam     Vital Signs:  Filed Vitals:    09/15/21 1214   BP: (!) 142/68   Pulse: 78   Resp: 18   Temp: 36.5 C (97.7 F)   SpO2: 98%       The initial visit vital signs are reviewed as above.     Pulse Ox interpreted by me: 98% on None (Room Air); Normal    Constitutional: The patient is alert and oriented to person, place and time.  The patient is appropriately interactive with a nontoxic and non-ill appearance.  The patient appears in mild distress and is resting uncomfortably in the gurney.  Lungs: Clear to auscultation bilaterally. With a normal inspiratory:expiratory ratio. No respiratory distress.  Cardiovascular: Heart is S1-S2 regular rate and rhythm without murmur click gallop or rub.  Abdomen: Soft, non-tender, non-distended without evidence of rebound or guarding.  Extremities: TTP over the left shoulder and knee. Otherwise, no acute tenderness to palpation, deformity, or abnormality of ROM.  Spine: No midline or paraspinal Muscle tenderness to palpation.  No step-off.   Skin: No cyanosis, jaundice, rash or lesion.  Neurologic: normal facial symmetry and speech, 5/5 upper and lower extremity strength, 2/4 patellar DTR's and intact light touch, pressure and pain sensation.   Vascular: Normal peripheral pulses with brisk capillary refills of less than 2 seconds.    Diagnostics     Labs:  No results found for any visits on 09/15/21.    Radiology:  XR AP MOBILE CHEST   Final Result   1. No acute cardiopulmonary process is appreciated.    2. Degenerative changes in the shoulders and spine.         Radiologist location ID: [id number]         XR PELVIS   Final Result   1. DJD. No acute bony abnormality is identified.  Radiologist location ID: [id number]         XR KNEE LEFT 3 VIEW   Final Result   Satisfactory alignment of the left knee prosthesis.                        Radiologist location ID: [id number]         XR SHOULDER LEFT    Final Result   Suspected proximal humerus fracture. Orthopedic consultation is recommended.                        Radiologist location ID: [id number]           Interpreted by radiologist and independently reviewed by me.      ED Course     Old records reviewed and summarized:  I have reviewed the nurse's notes. I have reviewed the patient's problem list and pertinent past medical records.    Emergency Department Course:  Initial orders placed:   Orders Placed This Encounter   . XR SHOULDER LEFT   . XR KNEE LEFT 3 VIEW   . XR PELVIS   . XR AP MOBILE CHEST   . HYDROmorphone (DILAUDID) 1 mg/mL injection   . ketorolac (TORADOL) 30 mg/mL injection   . diphtheria, pertussis-acell, tetanus (BOOSTRIX) IM injection   . HYDROmorphone (PF) (DILAUDID) 1 mg/mL injection ---Cabinet Override   . HYDROcodone-acetaminophen (NORCO) 5-325 mg Oral Tablet   . naloxone (NARCAN) 4 mg per spray nasal spray       1220: Initial evaluation is complete at this time. I discussed with the patient that I would order left shoulder xray, left knee xray, pelvis xray, chest xray, Dilaudid, Toradol and Boostrix to further evaluate. Patient is agreeable with the treatment plan at this time.     1334: I discussed the imaging results with the patient. I asked nursing staff to place the patient in a sling.    1340: Comptroller.    1:41 PM I discussed the patient's case and above findings with Dr. Thomasene Ripple (Orthopedics) who agrees with the treatment plan. On recheck, the patient is stable and ready for discharge. I explained the results of the diagnostic studies. Patient will be discharged with Narcan and Norco. Patient is to follow up with Caro Hight, MD today. I discussed the diagnosis, disposition, and follow-up plan. The patient understood and is in accordance with the treatment plan at this time. Patient is to return here to the Emergency Department if new or worsening symptoms appear. All of their questions have been answered to their  satisfaction. The patient is in stable condition at the time of discharge.     MIPS     Not applicable     OPIATE PRESCRIPTION      In addition to the non opioid alternatives that I have recommended, treatment with an opioid medication is indicated on a short term basis as a medically necessary treatment for the patient's acute, severe pain. The CMSP has been reviewed via Narxcheck and the patient's score is 190.  I have discussed the benefits and the risks of opioid medications including the potential for addiction and overdose/fatal respiratory depression, particularly if combined with alcohol or other central nervous system depressants.  I have discussed the quantity of medications being prescribed and the option to fill the prescription in the lesser quantity.  The patient is in agreement to proceed with treatment with an opioid medication.  Discharge instructions that explain  the risks and side effects of opioid medications have also been provided.    CORE MEASURES     Not applicable    CRITICAL CARE TIME     Not applicable     PRE-DISPOSITION VITALS      Pre-Disposition Vitals:  Filed Vitals:    09/15/21 1214   BP: (!) 142/68   Pulse: 78   Resp: 18   Temp: 36.5 C (97.7 F)   SpO2: 98%       Emergency Department Impression:  1. prox humerus fracture  2. Evaluation after fall.     Plan/Disposition:  1. discharge home    Condition on Disposition: Stable    Prescriptions:  New Prescriptions    HYDROCODONE-ACETAMINOPHEN (NORCO) 5-325 MG ORAL TABLET    Take 1 Tablet by mouth Every 6 hours as needed for Pain    NALOXONE (NARCAN) 4 MG PER SPRAY NASAL SPRAY    1 Spray by INTRANASAL route Every 2 minutes as needed       Follow Up:  Caro Hight, MD  [address]  [place] [address]  [phone]    Call in 1 day  to schedule an appointment for next week.      I, Salena Saner, SCRIBE, scribed for Marianna Fuss, DO on 09/15/2021 at 12:18 PM.     Documentation assistance provided for Marianna Fuss, DO by Salena Saner, SCRIBE on 09/15/2021 at 12:18 PM. Information recorded by the scribe was done at my direction and has been reviewed and validated by me.

## 2021-09-15 NOTE — ED Nurses Note (Signed)
Fitted and instructed on sling use. Verbalized understanding.  Patient discharged home with family.  AVS reviewed with patient/care giver.  A written copy of the AVS and discharge instructions was given to the patient/care giver.  Questions sufficiently answered as needed.  Patient/care giver encouraged to follow up with PCP as indicated.  In the event of an emergency, patient/care giver instructed to call 911 or go to the nearest emergency room.        Current Discharge Medication List      START taking these medications.      Details   HYDROcodone-acetaminophen 5-325 mg Tablet  Commonly known as: NORCO   1 Tablet, Oral, EVERY 6 HOURS PRN  Qty: 16 Tablet  Refills: 0     naloxone 4 mg/actuation Spray, Non-Aerosol  Commonly known as: NARCAN   1 Spray, INTRANASAL, EVERY 2 MIN PRN  Qty: 1 Each  Refills: 2        CONTINUE these medications - NO CHANGES were made during your visit.      Details   amLODIPine 10 mg Tablet  Commonly known as: NORVASC   10 mg, Oral, DAILY, AMLODIPINE BESYLATE   Qty: 90 Tablet  Refills: 1     lisinopriL-hydrochlorothiazide 20-12.5 mg Tablet  Commonly known as: ZESTORETIC   1 Tablet, Oral, 2 TIMES DAILY  Qty: 180 Tablet  Refills: 1     simvastatin 20 mg Tablet  Commonly known as: ZOCOR   20 mg, Oral, EVERY EVENING  Qty: 90 Tablet  Refills: 1     traZODone 150 mg Tablet  Commonly known as: DESYREL   150 mg, Oral, NIGHTLY  Refills: 0     * Ventolin HFA 90 mcg/actuation oral inhaler  Generic drug: albuterol sulfate   2 Puffs, Inhalation, EVERY 6 HOURS PRN  Qty: 1 Inhaler  Refills: 0     * albuterol sulfate 90 mcg/actuation oral inhaler  Commonly known as: ProAir HFA   1-2 Puffs, Inhalation, EVERY 6 HOURS PRN, W/ spacer  Qty: 1 Inhaler  Refills: 3         * This list has 2 medication(s) that are the same as other medications prescribed for you. Read the directions carefully, and ask your doctor or other care provider to review them with you.                  Pt denies need for work/school  note.  Pt assisted to car on departure with granddaughter.

## 2021-09-15 NOTE — ED Triage Notes (Signed)
Pt states she slipped on a hair ball her cat had thrown up, causing her to fall. When she fell her L arm went behind her. Family helped her off the ground. Arrives by EMS with 10/10 L shoulder and upper arm pain. Denies hitting her head, loc, or dizziness. Reports 5/10 L knee pain.

## 2021-09-17 ENCOUNTER — Other Ambulatory Visit (INDEPENDENT_AMBULATORY_CARE_PROVIDER_SITE_OTHER): Payer: Self-pay | Admitting: Family Medicine

## 2021-09-22 ENCOUNTER — Other Ambulatory Visit: Payer: Self-pay

## 2021-09-22 ENCOUNTER — Ambulatory Visit (INDEPENDENT_AMBULATORY_CARE_PROVIDER_SITE_OTHER): Payer: Medicare Other | Admitting: Orthopaedic Surgery

## 2021-09-22 VITALS — Temp 98.2°F | Ht 63.0 in | Wt 214.0 lb

## 2021-09-22 DIAGNOSIS — W010XXA Fall on same level from slipping, tripping and stumbling without subsequent striking against object, initial encounter: Secondary | ICD-10-CM

## 2021-09-22 DIAGNOSIS — S42202A Unspecified fracture of upper end of left humerus, initial encounter for closed fracture: Secondary | ICD-10-CM

## 2021-09-22 MED ORDER — OXYCODONE-ACETAMINOPHEN 5 MG-325 MG TABLET
1.0000 | ORAL_TABLET | ORAL | 0 refills | Status: DC | PRN
Start: 2021-09-22 — End: 2023-02-24

## 2021-09-22 NOTE — Progress Notes (Signed)
[facility]  [facility]  [address]  [place] [address]  Dept: [phone]  Dept Fax: [phone]  Loc: [phone]  Loc Fax: [phone]    CC:    Chief Complaint   Patient presents with   . Fracture     R humerus       HPI:   Toni Tran is a 65 y.o. female who presents to the clinic for left shoulder pain.  On 09/15/2021 the patient slipped on a hair ball that her cat threw up and landed directly onto her left shoulder.  She presented to the emergency room where radiographs demonstrated a left proximal humerus fracture.  The patient was placed into a sling and sent here for follow-up.  Today she states she continues to have pain at this time although her symptoms are somewhat improving    ROS:  GENERAL: No fever or chills  HEENT: no current nosebleed; no new onset visual disturbances  NECK: no swelling  CARDIOVASCULAR:  No severe chest pain  RESPIRATORY:  No severe SOA  ABDOMINAL: no severe abdominal pain  MSK: as per HPI  NEURO: no severe headache; as per HPI  SKIN: no current major rash  HEME: no major bruising or bleeding issues  EXTREMITIES: as per HPI  PSYCH: no delusions/hallucinations    PMH:   Past Medical History:   Diagnosis Date   . Anxiety    . 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 daily   . Chronic pain     right foot   . 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 Depression    . History of anesthesia complications     Required overnight stay after tarsal tunnel release d/t hypoxia per pt,    . Hypercholesterolemia    . Hyperlipidemia    . Hypertension    . MRSA infection    . Obesity    . Peripheral edema     right foot   . Peripheral neuropathy     Right foot s/p tarsal tunnel release   . Plantar fascial fibromatosis of right foot    . PONV (postoperative nausea and vomiting)     requests Anti Emetic   . RLS (restless legs syndrome)    .  Snores     Never has had sleep study   . Tarsal tunnel syndrome, right    . Vitamin D deficiency    . Wears glasses          Past Surgical History:   Procedure Laterality Date   . HX APPENDECTOMY  childhood   . HX EYE SURGERY Bilateral Childhood    Muscle Surgery   . HX FOOT SURGERY Right 06/07/2017    Tarsal Tunnel release; FASCIOTOMY PLANTAR   . HX SHOULDER SURGERY Right 06/2016    Exc. Bone Spur, Repair Rotator Cuff Tear   . HX TUBAL LIGATION  1992          Patient Active Problem List    Diagnosis   . Left knee DJD   . Morbid obesity with BMI of 40.0-44.9, adult (CMS [facility])   . Chest pain, pleuritic   . Acute bronchitis   . Plantar fasciitis of right foot   . Hypertension   . Hyperlipidemia   . Vitamin D deficiency   . CVA (cerebrovascular accident) (CMS [facility])   .  Anxiety   . Depression   . Obesity   . Plantar fascial fibromatosis of right foot   . Tarsal tunnel syndrome of right side       MEDICATIONS:  Current Outpatient Medications   Medication Sig   . albuterol sulfate (PROAIR HFA) 90 mcg/actuation Inhalation HFA Aerosol Inhaler Take 1-2 Puffs by inhalation Every 6 hours as needed W/ spacer   . amLODIPine (NORVASC) 10 mg Oral Tablet Take 1 Tablet (10 mg total) by mouth Once a day AMLODIPINE BESYLATE   . HYDROcodone-acetaminophen (NORCO) 5-325 mg Oral Tablet Take 1 Tablet by mouth Every 6 hours as needed for Pain   . lisinopriL-hydrochlorothiazide (ZESTORETIC) 20-12.5 mg Oral Tablet Take 1 Tablet by mouth Twice daily   . naloxone (NARCAN) 4 mg per spray nasal spray 1 Spray by INTRANASAL route Every 2 minutes as needed   . simvastatin (ZOCOR) 20 mg Oral Tablet Take 1 Tablet (20 mg total) by mouth Every evening   . traZODone (DESYREL) 150 mg Oral Tablet Take 150 mg by mouth Every night   . VENTOLIN HFA 90 mcg/actuation Inhalation HFA Aerosol Inhaler Take 2 Puffs by inhalation Every 6 hours as needed        ALLERGIES:   No Known Allergies     FAMILY HISTORY:  Family Medical History:     Problem Relation (Age of  Onset)    Alzheimer's/Dementia Mother, Father    Arthritis-osteo Mother, Father    Blood Clots Father    Congestive Heart Failure Father    Coronary Artery Disease Father    Diabetes Father    Heart Attack Mother, Father    High Cholesterol Mother, Father    Hypertension (High Blood Pressure) Mother, Father    Migraines Mother, Father    Sleep disorders Father    Stroke Mother, Father            SOCIAL HISTORY:   reports that she has never smoked. She has never used smokeless tobacco. She reports that she does not drink alcohol and does not use drugs.    PHYSICAL EXAM:  Temp 36.8 C (98.2 F)   Ht 1.6 m (5\' 3" )   Wt 97.1 kg (214 lb)   LMP  (LMP Unknown)   BMI 37.91 kg/m       GENERAL:  No acute distress; clean, appropriately dressed; well-nourished  HEENT: head normocephalic, atraumatic; nose w/out rhinorrhea/epistaxis  EYES: EOMI; vision intact   NECK: no JVD or obvious swelling  CARDIOVASCULAR: no pallor or cyanosis  RESPIRATORY:  non-labored breathing; symmetric chest wall rise bilat; no audible wheezing  ABDOMEN: no severe distention  NEURO:  Alert and oriented x3; conversant  EXTREMITIES: no diffuse obvious swelling  HEME: no diffuse obvious bruising or bleeding  PSYCH: normal affect and mood  MSK:  Bruising along the proximal right arm she has intact sensation and motor function distally throughout the extremity.    IMAGING:   Radiographs performed on 09/16/2019 to demonstrate a fracture to the proximal humerus that appears to have excellent alignment in both the coronal and sagittal plane there is no dislocation present there is really only a small was butterfly fragment along the posterior aspect of the humerus.    Images were personally reviewed and independently interpreted by me at today's visit.   11/16/2019, MD  09/22/2021, 08:11       ASSESSMENT/PLAN:    ICD-10-CM    1. Closed fracture of left proximal humerus  S42.202A  Proximal humerus fracture she will begin to do pendulum  exercises I provided her with a she today also give her a refill for Percocet for pain management she will begin therapy at [facility] she will follow up in 4 weeks  Caro Hight, MD  09/22/2021, 08:11    Portions of this note may be dictated using voice recognition software or a dictation service. Variances in spelling and vocabulary are possible and unintentional. Not all errors are caught/corrected. Please notify the [facility] if any discrepancies are noted or if the meaning of any statement is not clear.

## 2021-10-01 ENCOUNTER — Other Ambulatory Visit: Payer: Self-pay

## 2021-10-01 ENCOUNTER — Ambulatory Visit (HOSPITAL_COMMUNITY)
Admission: RE | Admit: 2021-10-01 | Discharge: 2021-10-01 | Disposition: A | Discharge status: Still In Hospital | Payer: Medicare Other | Source: Ambulatory Visit

## 2021-10-01 DIAGNOSIS — S42202A Unspecified fracture of upper end of left humerus, initial encounter for closed fracture: Secondary | ICD-10-CM | POA: Insufficient documentation

## 2021-10-01 NOTE — PT Evaluation (Signed)
[facility] Physical Outpatient Rehabilitation Services  A Department of [facility]  [address], [place], [place] [address]  ([phone]   (Fax) [phone]    Physical Therapy Evaluation    Date: 10/01/2021  Patient's Name: Toni Tran  Date of Birth: 10-Jun-1956    Plan of Care Visit Number: 1/12      Insurance Authorized Visit Number: 12   Re-certification Date: 11/19/21    Referring provider:  Caro Hight, MD    Medical Diagnosis:    1. Closed fracture of left proximal humerus      PT Diagnosis:  Left shoulder and UE pain, stiffness, weakness  Onset Date:  09/15/21  History of current condition: pt slipped and fell on her left shoulder.  Was able to get to up to a chair.  Went to hospital via EMS; had x-rays, seen by ortho 09/22/21. Fx is stable an no surgery recommended. Referred to PT.        Past Medical History:   Diagnosis Date   . Anxiety    . 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 daily   . Chronic pain     right foot   . 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 Depression    . History of anesthesia complications     Required overnight stay after tarsal tunnel release d/t hypoxia per pt,    . Hypercholesterolemia    . Hyperlipidemia    . Hypertension    . MRSA infection    . Obesity    . Peripheral edema     right foot   . Peripheral neuropathy     Right foot s/p tarsal tunnel release   . Plantar fascial fibromatosis of right foot    . PONV (postoperative nausea and vomiting)     requests Anti Emetic   . RLS (restless legs syndrome)    . Snores     Never has had sleep study   . Tarsal tunnel syndrome, right    . Vitamin D deficiency    . Wears glasses            Past Surgical History:   Procedure Laterality Date   . ARTHROPLASTY KNEE TOTAL Left 05/06/2020    Performed by 05/08/2020, DO at [facility] OR MAIN   . FASCIOTOMY PLANTAR Right 06/07/2017     Performed by 06/09/2017, DPM at [facility] OR INVENTORY (OLD [facility] OR)   . HX APPENDECTOMY  childhood   . HX EYE SURGERY Bilateral Childhood    Muscle Surgery   . HX FOOT SURGERY Right 06/07/2017    Tarsal Tunnel release; FASCIOTOMY PLANTAR   . HX SHOULDER SURGERY Right 06/2016    Exc. Bone Spur, Repair Rotator Cuff Tear   . HX TUBAL LIGATION  1992   . RELEASE TARSAL TUNNEL Right 06/07/2017    Performed by 06/09/2017, DPM at [facility] OR INVENTORY ([facility])       Current Outpatient Medications   Medication Sig   . albuterol sulfate (PROAIR HFA) 90 mcg/actuation Inhalation HFA Aerosol Inhaler Take 1-2 Puffs by inhalation Every 6 hours as needed W/ spacer   . amLODIPine (NORVASC) 10 mg Oral Tablet Take 1 Tablet (10 mg total) by mouth Once a day AMLODIPINE BESYLATE   . HYDROcodone-acetaminophen (NORCO) 5-325 mg Oral Tablet  Take 1 Tablet by mouth Every 6 hours as needed for Pain   . lisinopriL-hydrochlorothiazide (ZESTORETIC) 20-12.5 mg Oral Tablet Take 1 Tablet by mouth Twice daily   . naloxone (NARCAN) 4 mg per spray nasal spray 1 Spray by INTRANASAL route Every 2 minutes as needed   . oxyCODONE-acetaminophen (PERCOCET) 5-325 mg Oral Tablet Take 1 Tablet by mouth Every 4 hours as needed for Pain   . simvastatin (ZOCOR) 20 mg Oral Tablet Take 1 Tablet (20 mg total) by mouth Every evening   . traZODone (DESYREL) 150 mg Oral Tablet Take 150 mg by mouth Every night   . VENTOLIN HFA 90 mcg/actuation Inhalation HFA Aerosol Inhaler Take 2 Puffs by inhalation Every 6 hours as needed       Social/ADLs/MRADLs: lives  With 66 y/o & 76 y/o grand daughters and SO who just came home from the hospital on hospice.  Girls are helping with housekeeping, etc.    Pain: left upper arm and shoulder.     Neurological:  Old stroke mild right UE tremor residual.  Minimal motor deficits residual.    Range of Motion: A/PROM as noted.  Shoulder:  Motion Right Left PROM **   Flexion WFL 50 deg    Abduction WFL 30   IR WFL To body   ER WFL 20    Extension WFL NT   ** all left shoulder PROM with tight, guarded, and painful endfeel.      Elbow: Stiff at end range flexion; fingertips to shoulder.  Feels tight, swollen.  Wrist: WFL; also stiff, supination/pronation WFL and stiff    Strength:  Left shoulder/UE: Not tested on left due to pain with resistance and with AROM  Right shoulder/UE: functional strength    Posture: slightly rounded posture.  Left shoulder in guarded position, slightly down and forward.                             Palpation: Tenderness through upper arm, head of humerus.  Some swell, increased tissue tone.  Guarding around head of humerus, RTC        Assessment:    Fall Risk Assessment  Pt has a history of falls (fallen in the last year) and has a history of one of the following:  Seizure disorder, Orthostatic blood pressure problems or stroke/CVA     Problem list:  Pt presents s/p slip and fall when she sustained a proximal humerus fracture.  She has very limited active and passive ROM, functional shoulder and UE weakness, and postural changes all contributing to pt not being able to use the left UE for functional activities.    Pt is appropriate for, and will benefit from, skilled physical therapy treatment following the below Plan of Care in order to address the above problems and achieve below goals.      POC:    Pt will be seen 1-2X/week for 12 visits   Interventions within the plan of care may include but are not limited to; therapeutic exercises, therapeutic activities, manual therapy, neuromuscular re-education, gait training, self-care training, ultrasound, electrical stimulation, cryotherapy, and moist heat.      GOALS:  Short term goals: 6 visits  Pt independent and compliant with HEP; AA and passive shoulder ROM, AROM for elbow and wrist.   Improve passive ROM to 95 deg forward elevation, 45 ER.  Control and decrease pain and ms guarding to allow progress toward ROM goals and ready  to begin light UE strengthening.    Long  term goals:  12 visits  Independent and compliant with HEP to maintain gains made during clinical treatment and progress ROM/function to baseline level;  Gentle AROM and strengthening for left shoulder and UE.  PROM/AAROM at shoulder: 160 deg forward elevation, 65 deg ER,   Forward elevation/overhead reach to 90 deg with good shoulder girdle mechanics.    Rehab potential:  good    The patient/family have given verbal consent for evaluation and treatment.    TODAY'S TREATMENT:  Evaluation 20 min: low complexity  Exercise 10 min: instructed for standing Pendulum/Arm Hang: flex at hips, one foot back.  Table slide to flexion; seated and standing, flex at hips to protect back and don't lean on UE.  Therapeutic Activity: 15 min;  Went over findings, plan and goals with patient.  Emphasized that she does not need to, or let us, "push through" pain and be aggressive with exercises and ROM.  "Gentle and often will be our motto".  We also discussed some safety measures to help prevent more falls, use of lighting at night, bells on the cats to alert of their proximity.        Total Timed Treatment Minutes: 25  Total Treatment Time Minutes: 45     Evette Cristal, PT        Physician's signature:  ________________________________  Date: _________________

## 2021-10-09 ENCOUNTER — Ambulatory Visit (HOSPITAL_COMMUNITY)
Admission: RE | Admit: 2021-10-09 | Discharge: 2021-10-09 | Disposition: A | Discharge status: Still In Hospital | Payer: Medicare Other | Source: Ambulatory Visit | Attending: Orthopaedic Surgery | Admitting: Orthopaedic Surgery

## 2021-10-09 ENCOUNTER — Other Ambulatory Visit: Payer: Self-pay

## 2021-10-09 NOTE — PT Treatment (Signed)
[facility]  [place], [place] [address]     Outpatient Rehabilitation Services  Physical Therapy Progress Note    Patient Name: Toni Tran  Date of Birth: June 09, 1956  MRN: [id number]  Payor: [facility] MEDICARE / Plan: [facility] MEDICARE DUAL COMPLETE / Product Type: MEDICARE [facility] /   Diagnosis: Medical Diagnosis:    1. Closed fracture of left proximal humerus      PT Diagnosis:  Left shoulder and UE pain, stiffness, weakness  Onset Date:  09/15/21  History of current condition: pt slipped and fell on her left shoulder.  Was able to get to up to a chair.  Went to hospital via EMS; had x-rays, seen by ortho 09/22/21. Fx is stable an no surgery recommended. Referred to PT.    Recertification Date: 11/19/2021  Referring provider: Caro Hight, MD  Next Physician Follow-up Visit: TBD    10/09/2021 is the patient's 2nd visit out of 12      Subjective   Patient reports that she is feeling a 6/10 pain from L shoulder to elbow. She has pain medication that she takes, only takes it with reaching activities (such as laundry or dishes). Has been performing pendulums and AROM for the wrist and elbow at home without issue.     Objective   Therapeutic Exercise: 14 minutes  -L pendulums 3 x 30 secs  -AROM for L elbow and wrist, all planes x 10 each  -Pulleys, flexion and scaption x 10 each  -L AAROM with Tbar, IR and ER x 10 each    Manual Therapy: 8 minutes  -PROM for L shoulder flexion, abduction, IR, ER (within tolerance)    Patient supine, ice to L shoulder/proximal L arm x 10 mins    Total Treatment time: 22 minutes + 10 minutes of ice                        Assessment:   Patient showed fair tolerance to therapy today. Vcs were given during pendulums to let gravity and momentum move her arm instead of muscles in the shoulder. Patient was able to perform pulleys in flexion and scaption, and AAROM with a T-bar for L IR and ER. PROM was performed for continued gentle ROM restoration. Patient showed muscle guarding throughout, but  did begin to perform L shoulder flexion and IR with more ease by the conclusion of therapy. Iced her shoulder at the conclusion of therapy to decrease pain. Patient's pain decreased from a 6/10 to a 5/10.     From Eval: Fall Risk Assessment  Pt has a history of falls (fallen in the last year) and has a history of one of the following:  Seizure disorder, Orthostatic blood pressure problems or stroke/CVA     Problem list:  Pt presents s/p slip and fall when she sustained a proximal humerus fracture.  She has very limited active and passive ROM, functional shoulder and UE weakness, and postural changes all contributing to pt not being able to use the left UE for functional activities.    Pt is appropriate for, and will benefit from, skilled physical therapy treatment following the below Plan of Care in order to address the above problems and achieve below goals.      GOALS:  Short term goals: 6 visits  Pt independent and compliant with HEP; AA and passive shoulder ROM, AROM for elbow and wrist.   Improve passive ROM to 95 deg forward elevation, 45 ER.  Control and  decrease pain and ms guarding to allow progress toward ROM goals and ready to begin light UE strengthening.    Long term goals:  12 visits  Independent and compliant with HEP to maintain gains made during clinical treatment and progress ROM/function to baseline level;  Gentle AROM and strengthening for left shoulder and UE.  PROM/AAROM at shoulder: 160 deg forward elevation, 65 deg ER,   Forward elevation/overhead reach to 90 deg with good shoulder girdle mechanics.    Rehab potential:  good    The patient/family have given verbal consent for evaluation and treatment.    Plan:   Pt will be seen 1-2X/week for 12 visits   Interventions within the plan of care may include but are not limited to; therapeutic exercises, therapeutic activities, manual therapy, neuromuscular re-education, gait training, self-care training, ultrasound, electrical stimulation,  cryotherapy, and moist heat.    The risks/benefits of therapy have been discussed with the patient and he/she is in agreement with the established plan of care.    Therapist :   Ethelle Lyon, PTA  10/09/2021 14:13    Some notes and results may be difficult to understand and/or may prompt further questions. You can contact your healthcare provider's office via [email or web address] with non-urgent questions about your record and results or discuss at your next scheduled appointment. Please allow time for responses.

## 2021-10-13 ENCOUNTER — Ambulatory Visit
Admission: RE | Admit: 2021-10-13 | Discharge: 2021-10-13 | Disposition: A | Discharge status: Still In Hospital | Payer: Medicare Other | Source: Ambulatory Visit | Attending: Orthopaedic Surgery | Admitting: Orthopaedic Surgery

## 2021-10-13 ENCOUNTER — Other Ambulatory Visit: Payer: Self-pay

## 2021-10-14 NOTE — PT Treatment (Signed)
[facility]  [place], [place] [address]     Outpatient Rehabilitation Services  Physical Therapy Progress Note    Patient Name: Toni Tran  Date of Birth: [birth year]  MRN: [id number]  Payor: [facility] MEDICARE / Plan: [facility] MEDICARE DUAL COMPLETE / Product Type: MEDICARE [facility] /   Diagnosis: Medical Diagnosis:    1. Closed fracture of left proximal humerus      PT Diagnosis:  Left shoulder and UE pain, stiffness, weakness  Onset Date:  09/15/21  History of current condition: pt slipped and fell on her left shoulder.  Was able to get to up to a chair.  Went to hospital via EMS; had x-rays, seen by ortho 09/22/21. Fx is stable an no surgery recommended. Referred to PT.      Referring provider: Caro Hight, MD  Next Physician Follow-up Visit: TBD    10/14/2021 is the patient's 3rd visit out of 12    Recertification Date: 11/19/2021    Subjective   Patient reports that her shoulder and UE are "feeling a little better".  She is doing dishes "some".  Working on gentle shoulder AAROM    Objective   Therapeutic Exercise: 28 min  -AROM for L elbow and wrist, all planes x 10 each  -Pulleys, flexion/overhead reach; start with elbow bent then using pulley to lift it up as if reaching overhead.  "unfolding"    Went over use of pulley at home; gave pt a webbing loop to tie onto pulley eye and hang it over the top of the door.  (instructed pt to go to local hardware store to purchase small pulley and rope) Positioning of chair to allow smooth ROM path to maximize ROM.  Usually left side to door, pulley above or slightly forward to the left shoulder.    -Standing UE table slides B UE, flex at hips to bring trunk forward.    -Seated elbow/arm resting on table;flex forward to slide hand/arm and stretch to flexion.                                                      Elbow bent to 90 flex forward to stretch ER                                                      Shoulder in scaption plane, elbow/forearm on table.  Elbow bent to 90  deg.  Maintain elbow 90 lift/lower hand for active ER.  -Seated neck SB/traps stretches; each direction X3, hold 30 sec.      Total Treatment time: 30 minutes   Total Timed Treatments: 30 min.                        Assessment:   Patient reporting decreased pain levels and tolerating slowly increasing left shoulder ROM.  Limited and painful AROM.  Needed repeated verbal and tactile cues for performance of exercises using good shoulder mechanics.    Plan:   Continue with AA and PROM exercises.  Progress HEP as able.  Manual therapy to decrease ms guarding prn.    The risks/benefits of therapy have been discussed with the  patient and he/she is in agreement with the established plan of care.    Therapist :     Corena Pilgrim, PT  10/14/2021 14:13    -----------------------------------------------------------------------------------------------------------------------------------------------------------------------------------------------------------------------------------  FROM INITIAL EVAL:   Fall Risk Assessment:   Pt has a history of falls (fallen in the last year) and has a history of one of the following:  Seizure disorder, Orthostatic blood pressure problems or stroke/CVA     Problem list:  Pt presents s/p slip and fall when she sustained a proximal humerus fracture.  She has very limited active and passive ROM, functional shoulder and UE weakness, and postural changes all contributing to pt not being able to use the left UE for functional activities.    Pt is appropriate for, and will benefit from, skilled physical therapy treatment following the below Plan of Care in order to address the above problems and achieve below goals.    POC: pt will be seen 1-2X/week for 12 visits   Interventions within the plan of care may include but are not limited to; therapeutic exercises, therapeutic activities, manual therapy, neuromuscular re-education, gait training, self-care training, ultrasound, electrical stimulation,  cryotherapy, and moist heat.    GOALS:  Short term goals: 6 visits  Pt independent and compliant with HEP; AA and passive shoulder ROM, AROM for elbow and wrist.   Improve passive ROM to 95 deg forward elevation, 45 ER.  Control and decrease pain and ms guarding to allow progress toward ROM goals and ready to begin light UE strengthening.    Long term goals:  12 visits  Independent and compliant with HEP to maintain gains made during clinical treatment and progress ROM/function to baseline level;  Gentle AROM and strengthening for left shoulder and UE.  PROM/AAROM at shoulder: 160 deg forward elevation, 65 deg ER,   Forward elevation/overhead reach to 90 deg with good shoulder girdle mechanics.    Rehab potential:  good    The patient/family have given verbal consent for evaluation and treatment.  ---------------------------------------------------------------------------------------------------------------------------------------------------------------------------------------------------------------------------------------------

## 2021-10-15 ENCOUNTER — Ambulatory Visit (HOSPITAL_COMMUNITY): Payer: Self-pay

## 2021-10-19 ENCOUNTER — Ambulatory Visit (HOSPITAL_COMMUNITY): Payer: Self-pay

## 2021-10-21 ENCOUNTER — Encounter (HOSPITAL_BASED_OUTPATIENT_CLINIC_OR_DEPARTMENT_OTHER): Payer: Self-pay | Admitting: Orthopaedic Surgery

## 2021-10-21 ENCOUNTER — Ambulatory Visit (HOSPITAL_COMMUNITY): Payer: Self-pay

## 2021-10-26 ENCOUNTER — Ambulatory Visit (HOSPITAL_COMMUNITY): Payer: Self-pay

## 2021-10-26 ENCOUNTER — Telehealth (HOSPITAL_COMMUNITY): Payer: Self-pay

## 2021-10-26 NOTE — Telephone Encounter (Signed)
Called pt regarding missed appointments. She stated that she would like to be discharged at this time d/t death in family.     Ethelle Lyon, PTA

## 2021-10-26 NOTE — Progress Notes (Incomplete)
Patient has missed a couple of appointments. Called her to check in, and she would like to be discharged at this time.     Ethelle Lyon, PTA  10/26/2021 12:29

## 2021-10-28 ENCOUNTER — Ambulatory Visit (HOSPITAL_COMMUNITY): Payer: Self-pay

## 2021-11-02 ENCOUNTER — Ambulatory Visit (INDEPENDENT_AMBULATORY_CARE_PROVIDER_SITE_OTHER): Payer: Self-pay

## 2021-11-04 ENCOUNTER — Ambulatory Visit (INDEPENDENT_AMBULATORY_CARE_PROVIDER_SITE_OTHER): Payer: Self-pay

## 2021-11-09 ENCOUNTER — Ambulatory Visit (INDEPENDENT_AMBULATORY_CARE_PROVIDER_SITE_OTHER): Payer: Self-pay

## 2021-11-11 ENCOUNTER — Ambulatory Visit (INDEPENDENT_AMBULATORY_CARE_PROVIDER_SITE_OTHER): Payer: Self-pay

## 2022-02-09 ENCOUNTER — Other Ambulatory Visit (HOSPITAL_COMMUNITY): Payer: Self-pay | Admitting: Family Medicine

## 2022-02-09 DIAGNOSIS — Z1231 Encounter for screening mammogram for malignant neoplasm of breast: Secondary | ICD-10-CM

## 2022-02-12 ENCOUNTER — Encounter (HOSPITAL_COMMUNITY): Payer: Self-pay

## 2022-02-16 ENCOUNTER — Encounter (HOSPITAL_COMMUNITY): Payer: Self-pay

## 2022-02-23 ENCOUNTER — Encounter (HOSPITAL_COMMUNITY): Payer: Self-pay

## 2022-08-13 ENCOUNTER — Encounter (INDEPENDENT_AMBULATORY_CARE_PROVIDER_SITE_OTHER): Payer: Self-pay

## 2022-08-13 ENCOUNTER — Ambulatory Visit (INDEPENDENT_AMBULATORY_CARE_PROVIDER_SITE_OTHER): Payer: No Typology Code available for payment source | Attending: Nurse Practitioner

## 2022-08-13 ENCOUNTER — Ambulatory Visit (INDEPENDENT_AMBULATORY_CARE_PROVIDER_SITE_OTHER): Payer: No Typology Code available for payment source | Admitting: Nurse Practitioner

## 2022-08-13 ENCOUNTER — Ambulatory Visit (INDEPENDENT_AMBULATORY_CARE_PROVIDER_SITE_OTHER): Payer: No Typology Code available for payment source

## 2022-08-13 DIAGNOSIS — S9032XA Contusion of left foot, initial encounter: Secondary | ICD-10-CM

## 2022-08-13 DIAGNOSIS — R030 Elevated blood-pressure reading, without diagnosis of hypertension: Secondary | ICD-10-CM

## 2022-08-13 DIAGNOSIS — R52 Pain, unspecified: Secondary | ICD-10-CM

## 2022-08-13 LAB — VH POCT UA-AUTOMATED(UCC)
Blood, UA POCT: NEGATIVE
Glucose, UA POCT: NEGATIVE
Nitrite, UA POCT: NEGATIVE
PH, UA POCT: 5.5 (ref 4.6–8)
Specific Gravity, UA POCT: >=1.03 mg/dL (ref 1.001–1.035)
Urobilinogen, UA POCT: 0.2 mg/dL
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
## 2022-08-26 ENCOUNTER — Other Ambulatory Visit: Payer: Self-pay

## 2022-08-26 ENCOUNTER — Encounter (HOSPITAL_COMMUNITY): Payer: Self-pay

## 2022-08-26 ENCOUNTER — Ambulatory Visit (INDEPENDENT_AMBULATORY_CARE_PROVIDER_SITE_OTHER): Payer: Medicare Other | Admitting: Family

## 2022-08-26 ENCOUNTER — Encounter (INDEPENDENT_AMBULATORY_CARE_PROVIDER_SITE_OTHER): Payer: Self-pay | Admitting: Family

## 2022-08-26 VITALS — BP 126/72 | HR 82 | Temp 97.6°F | Ht 62.8 in | Wt 218.0 lb

## 2022-08-26 DIAGNOSIS — Z8673 Personal history of transient ischemic attack (TIA), and cerebral infarction without residual deficits: Secondary | ICD-10-CM

## 2022-08-26 DIAGNOSIS — F5101 Primary insomnia: Secondary | ICD-10-CM

## 2022-08-26 DIAGNOSIS — Z6841 Body Mass Index (BMI) 40.0 and over, adult: Secondary | ICD-10-CM

## 2022-08-26 DIAGNOSIS — R0602 Shortness of breath: Secondary | ICD-10-CM

## 2022-08-26 DIAGNOSIS — I10 Essential (primary) hypertension: Secondary | ICD-10-CM

## 2022-08-26 DIAGNOSIS — E785 Hyperlipidemia, unspecified: Secondary | ICD-10-CM

## 2022-08-26 DIAGNOSIS — Z131 Encounter for screening for diabetes mellitus: Secondary | ICD-10-CM

## 2022-08-26 DIAGNOSIS — Z1231 Encounter for screening mammogram for malignant neoplasm of breast: Secondary | ICD-10-CM

## 2022-08-26 DIAGNOSIS — R4589 Other symptoms and signs involving emotional state: Secondary | ICD-10-CM

## 2022-08-26 DIAGNOSIS — F321 Major depressive disorder, single episode, moderate: Secondary | ICD-10-CM | POA: Insufficient documentation

## 2022-08-26 DIAGNOSIS — R5383 Other fatigue: Secondary | ICD-10-CM

## 2022-08-26 MED ORDER — LISINOPRIL 20 MG-HYDROCHLOROTHIAZIDE 12.5 MG TABLET
1.0000 | ORAL_TABLET | Freq: Two times a day (BID) | ORAL | 1 refills | Status: DC
Start: 2022-08-26 — End: 2023-02-28

## 2022-08-26 MED ORDER — AMLODIPINE 10 MG TABLET
10.0000 mg | ORAL_TABLET | Freq: Every day | ORAL | 1 refills | Status: DC
Start: 2022-08-26 — End: 2023-02-28

## 2022-08-26 MED ORDER — ALBUTEROL SULFATE HFA 90 MCG/ACTUATION AEROSOL INHALER
1.0000 | INHALATION_SPRAY | Freq: Four times a day (QID) | RESPIRATORY_TRACT | 3 refills | Status: DC | PRN
Start: 2022-08-26 — End: 2023-06-21

## 2022-08-26 MED ORDER — SIMVASTATIN 20 MG TABLET
20.0000 mg | ORAL_TABLET | Freq: Every evening | ORAL | 1 refills | Status: DC
Start: 2022-08-26 — End: 2023-02-28

## 2022-08-26 MED ORDER — TRAZODONE 150 MG TABLET
150.0000 mg | ORAL_TABLET | Freq: Every evening | ORAL | 0 refills | Status: DC
Start: 2022-08-26 — End: 2023-02-24

## 2022-08-26 MED ORDER — TEMAZEPAM 30 MG CAPSULE
30.0000 mg | ORAL_CAPSULE | Freq: Every evening | ORAL | 0 refills | Status: DC | PRN
Start: 2022-08-26 — End: 2022-08-26

## 2022-08-26 MED ORDER — TEMAZEPAM 7.5 MG CAPSULE
7.5000 mg | ORAL_CAPSULE | Freq: Every evening | ORAL | 0 refills | Status: AC | PRN
Start: 2022-08-26 — End: 2022-09-09

## 2022-08-26 MED ORDER — ESCITALOPRAM 10 MG TABLET
10.0000 mg | ORAL_TABLET | Freq: Every day | ORAL | 3 refills | Status: DC
Start: 2022-08-26 — End: 2023-06-21

## 2022-08-26 NOTE — Progress Notes (Unsigned)
[facility] Medicine  FAMILY MEDICINE, [facility] FAMILY MEDICINE    Progress Note    Name: Toni Tran MRN:  [id number]   Date: 08/26/2022 Age: 66 y.o.       Chief Complaint: Establish Care, Medication Refill, Shortness of Breath (During short walks, climbing stairs), Difficulty Sleeping, and Edema    Subjective: Here to establish care, was a patient has a past medical history; ischemic stroke x2, hypertension, insomnia, depression with a major episode due to the death of her significant other, anxiety, dyslipidemia, peripheral edema, restless leg syndrome, and obesity.  She states today that she has been short of breath with activity, and she is noted swelling in her feet and hands.  She states that she tried to get her medications refilled but the pharmacy would not refill them due to her past primary care retiring.  She is tearful at times due to the passing of her significant other.  She states that she has had insomnia terribly.  She states she is on 150 mg of trazodone that has not been helping with her sleep.    Objective :  BP 126/72   Pulse 82   Temp 36.4 C (97.6 F) (Thermal Scan)   Ht 1.595 m (5' 2.8")   Wt 98.9 kg (218 lb)   LMP  (LMP Unknown)   SpO2 99%   BMI 38.86 kg/m     Physical Exam  Constitutional:       Appearance: Normal appearance.   HENT:      Head: Normocephalic.      Nose: Nose normal.      Mouth/Throat:      Mouth: Mucous membranes are dry.   Eyes:      Pupils: Pupils are equal, round, and reactive to light.   Cardiovascular:      Rate and Rhythm: Normal rate and regular rhythm.   Pulmonary:      Breath sounds: Wheezing present.      Comments: Faint wheeze noted cleared with cough  Musculoskeletal:         General: Normal range of motion.      Right lower leg: No edema.      Left lower leg: No edema.   Skin:     General: Skin is warm.      Capillary Refill: Capillary refill takes less than 2 seconds.   Neurological:      General: No focal deficit present.      Mental Status: She is  alert.   Psychiatric:         Mood and Affect: Mood normal.        Data reviewed:08/26/2022    Current Outpatient Medications   Medication Sig    albuterol sulfate (PROAIR HFA) 90 mcg/actuation Inhalation HFA Aerosol Inhaler Take 1-2 Puffs by inhalation Every 6 hours as needed W/ spacer (Patient not taking: Reported on 08/26/2022)    amLODIPine (NORVASC) 10 mg Oral Tablet Take 1 Tablet (10 mg total) by mouth Once a day AMLODIPINE BESYLATE    HYDROcodone-acetaminophen (NORCO) 5-325 mg Oral Tablet Take 1 Tablet by mouth Every 6 hours as needed for Pain (Patient not taking: Reported on 08/26/2022)    lisinopriL-hydrochlorothiazide (ZESTORETIC) 20-12.5 mg Oral Tablet Take 1 Tablet by mouth Twice daily    naloxone (NARCAN) 4 mg per spray nasal spray 1 Spray by INTRANASAL route Every 2 minutes as needed (Patient not taking: Reported on 08/26/2022)    oxyCODONE-acetaminophen (PERCOCET) 5-325 mg Oral Tablet Take 1 Tablet by mouth  Every 4 hours as needed for Pain (Patient not taking: Reported on 08/26/2022)    simvastatin (ZOCOR) 20 mg Oral Tablet Take 1 Tablet (20 mg total) by mouth Every evening    traZODone (DESYREL) 150 mg Oral Tablet Take 1 Tablet (150 mg total) by mouth Every night    VENTOLIN HFA 90 mcg/actuation Inhalation HFA Aerosol Inhaler Take 2 Puffs by inhalation Every 6 hours as needed (Patient not taking: Reported on 08/26/2022)     Assessment/Plan  Problem List Items Addressed This Visit          Cardiovascular System    Hypertension - Primary    Stable on current medication, continue with   Has been one year since she has had regular labs     amLODIPine (NORVASC) 10 mg Oral Tablet daily     lisinopriL-hydrochlorothiazide (ZESTORETIC) 20-12.5 mg Oral Tablet daily     Other Relevant Orders    CBC W/AUTO DIFF    COMPREHENSIVE METABOLIC PNL, FASTING    MICROALBUMIN/CREATININE RATIO, URINE, RANDOM       Psychiatric    2. Moderate major depression (CMS HCC)  Has been very depressed as her SO passed away, and the lack  of sleep has made it worse  Currently not on any medications for depression.   Is willing to start on Lexapro 10 mg 1/2 tablet for daily for 7 days then increase   Will start on Lexapro 5 mf for 7 days,        Other    2 Morbid obesity with BMI of 40.0-44.9, adult (CMS HCC)   Discussed diet and exercise   Other Visit Diagnoses       3 Screening for diabetes mellitus        Will eval for DM II    HGA1C (HEMOGLOBIN A1C WITH EST AVG GLUCOSE)    4 Fatigue, unspecified type        Relevant Orders    THYROID STIMULATING HORMONE WITH FREE T4 REFLEX    5 Dyslipidemia        Relevant Medications    simvastatin (ZOCOR) 20 mg Oral Tablet    Other Relevant Orders    LIPID PANEL    6. Primary insomnia        Has had severe insomnia since her SO passed.  Currently on 150 mg Trazodone without any results   Will wean from trazodone , start Restoril     temazepam (RESTORIL) 30 mg Oral Capsule- QHS     Start to wean off traZODone (DESYREL) 150 mg Oral Tablet, weaning instructions were given     7   SOB (shortness of breath)        Not new per say; but will get an EKG to see if any changes  Continue MDI as needed for wheezing     albuterol sulfate (PROAIR HFA) 90 mcg/actuation Inhalation oral inhaler    Other Relevant Orders    B-TYPE NATRIURETIC PEPTIDE    ECG 12-LEAD    7 History of multiple strokes        Relevant Orders    CAROTID ARTERY DUPLEX    8 Screening mammogram for breast cancer        Needs yearly screening     MAMMO BILATERAL SCREENING    9. Symptoms of depression        Patient is agreeable to start on Lexapro due to the recent death of her SO  Will start on 5mg  daily for 7 days  then increase to whole tablet.     escitalopram oxalate (LEXAPRO) 10 mg Oral Tablet          FLU in 3 months   Refills provided     Jaci Standard, FNP-BC

## 2022-08-26 NOTE — Nursing Note (Signed)
Chief Complaint:   Chief Complaint              Establish Care     Medication Refill     Shortness of Breath During short walks, climbing stairs    Difficulty Sleeping     Edema           Functional Health Screen  Functional Health Screening:        BP 126/72   Pulse 82   Temp 36.4 C (97.6 F) (Thermal Scan)   Ht 1.595 m (5' 2.8")   Wt 98.9 kg (218 lb)   LMP  (LMP Unknown)   SpO2 99%   BMI 38.86 kg/m       Social History     Tobacco Use   Smoking Status Never   Smokeless Tobacco Never     Patient Health Rating           Depression Screening  PHQ Questionnaire  Little interest or pleasure in doing things.: Nearly every day  Feeling down, depressed, or hopeless: Nearly every day  PHQ 2 Total: 6  Trouble falling or staying asleep, or sleeping too much.: Nearly every day  Feeling tired or having little energy: More than half the days  Poor appetite or overeating: Not at all  Feeling bad about yourself/ that you are a failure in the past 2 weeks?: Not at all  Trouble concentrating on things in the past 2 weeks?: Several Days  Moving/Speaking slowly or being fidgety or restless  in the past 2 weeks?: Several Days  Thoughts that you would be better off DEAD, or of hurting yourself in some way.: Not at all  PHQ 9 Total: 13  Interpretation of Total Score: 10-14 Moderate depression  Allergies:  No Known Allergies  Medication History  Reviewed for OTC medication and any new medications, provider will review medication history  Results through Enter/Edit  No results found for this or any previous visit (from the past 24 hour(s)).  POCT Results  Care Team  Patient Care Team:  Jaci Standard, FNP-BC as PCP - General (NURSE PRACTITIONER)  Immunizations - last 24 hours       Date Immunization Status Dose Route/Site Given by    06/23/20 0000 Covid-19 Vaccine,Pfizer-BioNTech,Purple Top,36yrs+ Deleted 0.3 mL Intramuscular/Left arm     07/14/20 0000 Covid-19 Vaccine,Pfizer-BioNTech,Purple Top,18yrs+ Deleted 0.3 mL  Intramuscular/Left arm     01/15/21 0000 Covid-19 Vaccine,Pfizer-BioNTech,Purple Top,12yrs+ Deleted 0.3 mL Intramuscular/Left arm           Gevena Cotton, MA  08/26/2022, 09:25

## 2022-08-27 ENCOUNTER — Encounter (INDEPENDENT_AMBULATORY_CARE_PROVIDER_SITE_OTHER): Payer: Self-pay | Admitting: Family

## 2022-08-30 ENCOUNTER — Ambulatory Visit: Payer: Medicare Other | Attending: Family

## 2022-08-30 ENCOUNTER — Ambulatory Visit (HOSPITAL_BASED_OUTPATIENT_CLINIC_OR_DEPARTMENT_OTHER)
Admission: RE | Admit: 2022-08-30 | Discharge: 2022-08-30 | Disposition: A | Discharge status: Home / Self Care | Payer: Medicare Other | Source: Ambulatory Visit | Attending: Family | Admitting: Family

## 2022-08-30 ENCOUNTER — Other Ambulatory Visit: Payer: Self-pay

## 2022-08-30 DIAGNOSIS — E785 Hyperlipidemia, unspecified: Secondary | ICD-10-CM | POA: Insufficient documentation

## 2022-08-30 DIAGNOSIS — I10 Essential (primary) hypertension: Secondary | ICD-10-CM | POA: Insufficient documentation

## 2022-08-30 DIAGNOSIS — R0602 Shortness of breath: Secondary | ICD-10-CM

## 2022-08-30 DIAGNOSIS — R5383 Other fatigue: Secondary | ICD-10-CM | POA: Insufficient documentation

## 2022-08-30 DIAGNOSIS — I4510 Unspecified right bundle-branch block: Secondary | ICD-10-CM

## 2022-08-30 DIAGNOSIS — Z131 Encounter for screening for diabetes mellitus: Secondary | ICD-10-CM | POA: Insufficient documentation

## 2022-08-30 LAB — COMPREHENSIVE METABOLIC PNL, FASTING
ALBUMIN: 4.2 g/dL (ref 3.4–4.8)
ALKALINE PHOSPHATASE: 94 U/L (ref 55–145)
ALT (SGPT): 29 U/L — ABNORMAL HIGH (ref 8–22)
ANION GAP: 13 mmol/L (ref 4–13)
AST (SGOT): 20 U/L (ref 8–45)
BILIRUBIN TOTAL: 0.8 mg/dL (ref 0.3–1.3)
BUN/CREA RATIO: 15 (ref 6–22)
BUN: 14 mg/dL (ref 8–25)
CALCIUM: 9.9 mg/dL (ref 8.6–10.3)
CHLORIDE: 101 mmol/L (ref 96–111)
CO2 TOTAL: 23 mmol/L (ref 23–31)
CREATININE: 0.92 mg/dL (ref 0.60–1.05)
ESTIMATED GFR: 69 mL/min/BSA (ref >=60)
GLUCOSE: 213 mg/dL — ABNORMAL HIGH (ref 70–99)
POTASSIUM: 3.8 mmol/L (ref 3.5–5.1)
PROTEIN TOTAL: 7.7 g/dL (ref 6.0–8.0)
SODIUM: 137 mmol/L (ref 136–145)

## 2022-08-30 LAB — ECG 12-LEAD
Atrial Rate: 80 {beats}/min
Calculated P Axis: 45 degrees
Calculated R Axis: -26 degrees
Calculated T Axis: 20 degrees
PR Interval: 168 ms
QRS Duration: 134 ms
QT Interval: 422 ms
QTC Calculation: 486 ms
Ventricular rate: 80 {beats}/min

## 2022-08-30 LAB — CBC W/AUTO DIFF
BASOPHIL #: <0.1 10*3/uL (ref <=0.20)
BASOPHIL %: 1 %
EOSINOPHIL #: 0.21 10*3/uL (ref <=0.50)
EOSINOPHIL %: 2 %
HCT: 41.4 % (ref 34.8–46.0)
HGB: 14.9 g/dL (ref 11.5–16.0)
IMMATURE GRANULOCYTE #: <0.1 10*3/uL (ref <0.10)
IMMATURE GRANULOCYTE %: 1 % (ref 0–1)
LYMPHOCYTE #: 1.85 10*3/uL (ref 1.00–4.80)
LYMPHOCYTE %: 19 %
MCH: 33.3 pg — ABNORMAL HIGH (ref 26.0–32.0)
MCHC: 36 g/dL — ABNORMAL HIGH (ref 31.0–35.5)
MCV: 92.4 fL (ref 78.0–100.0)
MONOCYTE #: 0.71 10*3/uL (ref 0.20–1.10)
MONOCYTE %: 7 %
MPV: 11.6 fL (ref 8.7–12.5)
NEUTROPHIL #: 7.09 10*3/uL (ref 1.50–7.70)
NEUTROPHIL %: 70 %
PLATELETS: 182 10*3/uL (ref 150–400)
RBC: 4.48 10*6/uL (ref 3.85–5.22)
RDW-CV: 11.9 % (ref 11.5–15.5)
WBC: 10 10*3/uL (ref 3.7–11.0)

## 2022-08-30 LAB — THYROID STIMULATING HORMONE WITH FREE T4 REFLEX: TSH: 2.118 u[IU]/mL (ref 0.350–4.940)

## 2022-08-30 LAB — MICROALBUMIN/CREATININE RATIO, URINE, RANDOM
CREATININE RANDOM URINE: 148 mg/dL — ABNORMAL HIGH (ref 50–100)
MICROALBUMIN RANDOM URINE: 1.9 mg/dL
MICROALBUMIN/CREATININE RATIO RANDOM URINE: 12.8 mg/g (ref <30.0)

## 2022-08-30 LAB — LIPID PANEL
CHOL/HDL RATIO: 3.6
CHOLESTEROL: 187 mg/dL (ref 100–200)
HDL CHOL: 52 mg/dL (ref >=50)
LDL CALC: 105 mg/dL — ABNORMAL HIGH (ref <100)
NON-HDL: 135 mg/dL (ref <=190)
TRIGLYCERIDES: 176 mg/dL — ABNORMAL HIGH (ref <150)
VLDL CALC: 29 mg/dL (ref <30)

## 2022-08-30 LAB — B-TYPE NATRIURETIC PEPTIDE: BNP: <10 pg/mL (ref <=99)

## 2022-08-30 LAB — HGA1C (HEMOGLOBIN A1C WITH EST AVG GLUCOSE)
ESTIMATED AVERAGE GLUCOSE: 137 mg/dL
HEMOGLOBIN A1C: 6.4 % — ABNORMAL HIGH (ref 4.0–5.6)

## 2022-08-31 ENCOUNTER — Encounter (INDEPENDENT_AMBULATORY_CARE_PROVIDER_SITE_OTHER): Payer: Self-pay

## 2022-08-31 ENCOUNTER — Telehealth (INDEPENDENT_AMBULATORY_CARE_PROVIDER_SITE_OTHER): Payer: Self-pay | Admitting: Family

## 2022-08-31 NOTE — Telephone Encounter (Signed)
Can let the patient know that her EKG has changed from her last one in 2021.  The EKG tells [place] that she now has a right bundle branch block, which can mean some underlying heart disease.  Since she already had hypertension, elevated lipids and diabetes we prob need to get her into Cardiology which they may want to do a stress test.      Thanks Carmell Austria

## 2022-08-31 NOTE — Result Encounter Note (Signed)
Sent MyChart message to patient.   Fabio Pierce, MA

## 2022-08-31 NOTE — Nursing Note (Signed)
Left voicemail that I was sending a MyChart message. Messaged patient with additional information.  Fabio Pierce, MA

## 2022-09-01 ENCOUNTER — Telehealth (INDEPENDENT_AMBULATORY_CARE_PROVIDER_SITE_OTHER): Payer: Self-pay | Admitting: Family

## 2022-09-01 DIAGNOSIS — I4510 Unspecified right bundle-branch block: Secondary | ICD-10-CM

## 2022-09-23 ENCOUNTER — Ambulatory Visit
Admission: RE | Admit: 2022-09-23 | Discharge: 2022-09-23 | Disposition: A | Discharge status: Home / Self Care | Payer: Medicare Other | Source: Ambulatory Visit | Attending: Family | Admitting: Family

## 2022-09-23 ENCOUNTER — Other Ambulatory Visit: Payer: Self-pay

## 2022-09-23 ENCOUNTER — Telehealth (INDEPENDENT_AMBULATORY_CARE_PROVIDER_SITE_OTHER): Payer: Self-pay | Admitting: Family

## 2022-09-23 DIAGNOSIS — I6523 Occlusion and stenosis of bilateral carotid arteries: Secondary | ICD-10-CM

## 2022-09-23 DIAGNOSIS — Z8673 Personal history of transient ischemic attack (TIA), and cerebral infarction without residual deficits: Secondary | ICD-10-CM | POA: Insufficient documentation

## 2022-09-23 NOTE — Telephone Encounter (Signed)
-----   Message from Darius Bump, FNP-BC sent at 09/23/2022 11:11 AM EDT -----  Can we let Juliann Pulse know that her Carotid  study show that she has mild plaque.  No blockages, and we will just continue the Statin for her cholesterol.      Thanks,  Carmell Austria

## 2022-09-23 NOTE — Telephone Encounter (Signed)
Left voicemail on patient's cell phone with US carotid Doppler results. Advised her that if she had any questions or concerns to feel free to call me back at the office or send a MyChart message.    Trinidad Curet, MA

## 2022-10-21 ENCOUNTER — Encounter (INDEPENDENT_AMBULATORY_CARE_PROVIDER_SITE_OTHER): Payer: Self-pay

## 2022-10-25 ENCOUNTER — Ambulatory Visit
Admission: RE | Admit: 2022-10-25 | Discharge: 2022-10-25 | Disposition: A | Discharge status: Home / Self Care | Payer: Medicare Other | Source: Ambulatory Visit | Attending: Family | Admitting: Family

## 2022-10-25 ENCOUNTER — Other Ambulatory Visit: Payer: Self-pay

## 2022-10-25 ENCOUNTER — Encounter (HOSPITAL_BASED_OUTPATIENT_CLINIC_OR_DEPARTMENT_OTHER): Payer: Self-pay

## 2022-10-25 DIAGNOSIS — Z1231 Encounter for screening mammogram for malignant neoplasm of breast: Secondary | ICD-10-CM | POA: Insufficient documentation

## 2022-11-26 ENCOUNTER — Encounter (HOSPITAL_COMMUNITY): Payer: Self-pay

## 2022-11-30 ENCOUNTER — Ambulatory Visit (HOSPITAL_COMMUNITY): Payer: Medicare Other | Admitting: Cardiovascular Disease

## 2022-12-01 ENCOUNTER — Other Ambulatory Visit: Payer: Self-pay

## 2022-12-01 ENCOUNTER — Encounter (INDEPENDENT_AMBULATORY_CARE_PROVIDER_SITE_OTHER): Payer: Self-pay | Admitting: Cardiovascular Disease

## 2022-12-01 ENCOUNTER — Ambulatory Visit (INDEPENDENT_AMBULATORY_CARE_PROVIDER_SITE_OTHER): Payer: Medicare Other | Admitting: Cardiovascular Disease

## 2022-12-01 VITALS — BP 160/83 | HR 96 | Ht 62.8 in | Wt 208.6 lb

## 2022-12-01 DIAGNOSIS — I4510 Unspecified right bundle-branch block: Secondary | ICD-10-CM

## 2022-12-01 DIAGNOSIS — R0609 Other forms of dyspnea: Secondary | ICD-10-CM

## 2022-12-01 DIAGNOSIS — R609 Edema, unspecified: Secondary | ICD-10-CM

## 2022-12-01 DIAGNOSIS — R0602 Shortness of breath: Secondary | ICD-10-CM

## 2022-12-01 DIAGNOSIS — I69351 Hemiplegia and hemiparesis following cerebral infarction affecting right dominant side: Secondary | ICD-10-CM

## 2022-12-01 DIAGNOSIS — E785 Hyperlipidemia, unspecified: Secondary | ICD-10-CM

## 2022-12-01 DIAGNOSIS — R079 Chest pain, unspecified: Secondary | ICD-10-CM

## 2022-12-01 DIAGNOSIS — I639 Cerebral infarction, unspecified: Secondary | ICD-10-CM

## 2022-12-01 DIAGNOSIS — R600 Localized edema: Secondary | ICD-10-CM

## 2022-12-01 DIAGNOSIS — I10 Essential (primary) hypertension: Secondary | ICD-10-CM

## 2022-12-01 LAB — ECG W INTERP (AMB USE ONLY)(MUSE,IN CLINIC)
Atrial Rate: 88 {beats}/min
Calculated P Axis: 44 degrees
Calculated R Axis: -46 degrees
Calculated T Axis: 34 degrees
PR Interval: 162 ms
QRS Duration: 130 ms
QT Interval: 414 ms
QTC Calculation: 500 ms
Ventricular rate: 88 {beats}/min

## 2022-12-01 NOTE — H&P (Signed)
Toni Tran, Toni Tran  [address] Toni Tran  Toni Tran [id number]  [id number]    Date: 12/01/2022  Patient Name: Toni Tran  MRN#: [id number]  DOB: Jan 29, 1956    Provider: Barnet Glasgow, MD  PCP: Toni Bump, FNP-BC      Reason for visit: EKG, New Patient, Hypertension, and Hyperlipidemia    History:     Toni Tran is a 66 y.o. female presenting today as a new patient referred by Toni Scrape FNP-BC for new RBBB and SOB. She has a past medical history of CVA,  HTN, HLD, and peripheral edema.    Today she is accompanied by her daughter and she reports DOE and LE edema over the last few months. She describes sharp, fleeting CP as well described as sharp/focal and "takes her breath away" that lasts a few seconds. She denies CP with activity. Her SOB occurs with exertion and is limiting, such as when she is showering or stair climbing. She also mentions occasional LE edema, but denies recent weight gain and has actually lost weight over this time. Her BP is elevated in office today and she reports elevated sBP in 145s-180's at home. She is complaint with taking her medications and tolerating them well. No c/o any palpitations, syncope, orthopnea/PND, or claudication. No known PMH of MI/CAD or CHF.  Remote CVA.    Lives in [place], [place]. Nonsmoker.     Past Medical History:     Past Medical History:   Diagnosis Date    Anxiety     Blood 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, right sided weakness    Depression     History of anesthesia complications     Required overnight stay after tarsal tunnel release d/t hypoxia per pt,     Hypercholesterolemia     Hyperlipidemia     Hypertension     MRSA infection     Obesity     Peripheral edema     right foot    Peripheral neuropathy     Right foot s/p  tarsal tunnel release    Plantar fascial fibromatosis of right foot     PONV (postoperative nausea and vomiting)     requests Anti Emetic    RLS (restless legs syndrome)     Snores     Never has had sleep study    Tarsal tunnel syndrome, right     Vitamin D deficiency     Wears glasses      Past Surgical History:     Past Surgical History:   Procedure Laterality Date    HX APPENDECTOMY  childhood    HX EYE SURGERY Bilateral Childhood    Muscle Surgery    HX FOOT SURGERY Right 06/07/2017    Tarsal Tunnel release; FASCIOTOMY PLANTAR    HX SHOULDER SURGERY Right 06/2016    Exc. Bone Spur, Repair Rotator Cuff Tear    HX TUBAL LIGATION  1992    KNEE SURGERY Left      Allergies:   No Known Allergies    Medications:     Outpatient Medications Marked as Taking for the 12/01/22 encounter (Office Visit) with Keerat Denicola, Monia Sabal, MD   Medication Sig    amLODIPine ([place]) 10  mg Oral Tablet Take 1 Tablet (10 mg total) by mouth Once a day AMLODIPINE BESYLATE    aspirin 81 mg Oral Tablet, Chewable Chew 1 Tablet (81 mg total) Once a day    lisinopriL-hydrochlorothiazide (ZESTORETIC) 20-12.5 mg Oral Tablet Take 1 Tablet by mouth Twice daily    simvastatin (ZOCOR) 20 mg Oral Tablet Take 1 Tablet (20 mg total) by mouth Every evening       Family History:     Family Medical History:       Problem Relation (Age of Onset)    Alzheimer's/Dementia Mother, Father    Arthritis-osteo Mother, Father    Blood Clots Father    Breast Cancer Maternal Aunt, Maternal Aunt    Congestive Heart Failure Father    Coronary Artery Disease Father    Diabetes Father    Heart Attack Mother, Father    High Cholesterol Mother, Father    Hypertension (High Blood Pressure) Mother, Father    Migraines Mother, Father    Sleep disorders Father    Stroke Mother, Father              Social History:     Social History     Socioeconomic History    Marital status: Single   Tobacco Use    Smoking status: Never    Smokeless tobacco: Never   Substance and Sexual Activity     Alcohol use: No    Drug use: No   Other Topics Concern    Ability to Walk 2 Flight of Steps without SOB/CP Yes    Ability To Do Own ADL's Yes         Review of Systems:     All other systems were reviewed and are negative other than noted in the HPI.    Physical Exam:     Vitals:    12/01/22 1044   BP: (!) 160/83   Pulse: 96   SpO2: 98%   Weight: 94.6 kg (208 lb 9.6 oz)   Height: 1.595 m (5' 2.8")   BMI: 37.27       Body mass index is 37.19 kg/m.    General: No acute distress, overweight  HEENT: Moist mucous membranes, EOMI  Neck: Supple, no JVD, no bruits  Heart/Cardiovascular:  RRR  No murmurs, rubs, or gallops  2+ radial pulses  2+ PT pulses  Respiratory/Lungs: Non-labored, clear to auscultation  Abdominal/Gastrointestinal: Soft, non-tender  Extremities: Warm and well-perfused, no edema x4  Skin: No rash  Neurological: Alert and oriented x3, grossly nonfocal  Psych: Normal affect    Cardiovascular Workup:     Echo 05/16/19  1. Normal left ventricular size. Normal left ventricular ejection fraction. LV Ejection Fraction is 74 %.  Concentric remodeling. Abnormal diastolic function, elevated filling pressure.  2. Resting Segmental Wall Motion Analysis: Total wall motion score is 1.00. There are no regional wall  motion abnormalities.  3. There is no apparent significant valvular abnormality.  4. Normal pericardium with no pericardial effusion.    PFT 05/16/19   Pulmonary Function Diagnosis:   Severe Obstructive Airways Disease with clinically significant reversibility   with use of bronchodilators. This correlates with asthma.     Carotid duplex 09/23/22  1. RIGHT CAROTID ARTERY:    Mild plaque with less than 50% stenosis   2. LEFT CAROTID ARTERY:  Mild plaque  3. VERTEBRAL ARTERIES: Antegrade flow with normal waveforms bilaterally.    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 female with    RBBB:  - present on most of her ECGs dating back to 2018 (personally  reviewed)    SOB/DOE:  - limiting SOB/DOE over the last few months   - reviewed prior PFT's that showed asthma which could be contributing to her SOB/DOE  - will order echo to assess heart structure and function   - will order MPS to r/o ischemia given atypical CP (doubt she has significant CAD)  - BNP 08/30/22 WNL   - no edema on exam today    CVA: x 2 10/2010 and 01/2012-residual tremor right hand, right sided weakness (noted in PMH, no records available to review)  - carotid duplex 09/23/22 WNL   - on aspirin, statin    HTN: elevated today; reports of elevated sBP in 145s-180's at home  - on amlodipine and lisinopril-HCTZ  - f/b PCP  - goal <130/80  - controlled 08/2022 visit    Lipids: FLP 08/30/22 total 187 trig 176 HDL 52 LDL 105  - on simvastatin, f/b PCP  - encourage heart-healthy diet (low in saturated fat, avoid simple carbs, high in vegetables/fish) and regular aerobic exercise (goal 30 min/day for 5 days/week)    Nonsmoker    Follow up after testing     Thank you for allowing me to participate in the care of your patient. Please feel free to contact me if there are further questions.     I am scribing for, and in the presence of, Dr. Barnet Glasgow, MD for services provided on 12/01/2022.  Raquel Sarna Case, [place] Case, SCRIBE, 12/01/2022, 11:30    I personally performed the services described in this documentation, as scribed in my presence, and it is both accurate and complete.    Barnet Glasgow, MD    Barnet Glasgow, MD

## 2023-02-24 ENCOUNTER — Ambulatory Visit (INDEPENDENT_AMBULATORY_CARE_PROVIDER_SITE_OTHER): Payer: Medicare Other | Admitting: Family

## 2023-02-24 ENCOUNTER — Other Ambulatory Visit: Payer: Self-pay

## 2023-02-24 ENCOUNTER — Encounter (HOSPITAL_COMMUNITY): Payer: Self-pay

## 2023-02-24 ENCOUNTER — Encounter (INDEPENDENT_AMBULATORY_CARE_PROVIDER_SITE_OTHER): Payer: Self-pay | Admitting: Family

## 2023-02-24 VITALS — BP 136/88 | HR 80 | Temp 97.0°F | Wt 206.0 lb

## 2023-02-24 DIAGNOSIS — I69351 Hemiplegia and hemiparesis following cerebral infarction affecting right dominant side: Secondary | ICD-10-CM | POA: Insufficient documentation

## 2023-02-24 DIAGNOSIS — Z780 Asymptomatic menopausal state: Secondary | ICD-10-CM

## 2023-02-24 DIAGNOSIS — F321 Major depressive disorder, single episode, moderate: Secondary | ICD-10-CM

## 2023-02-24 DIAGNOSIS — Z0000 Encounter for general adult medical examination without abnormal findings: Secondary | ICD-10-CM

## 2023-02-24 DIAGNOSIS — Z23 Encounter for immunization: Secondary | ICD-10-CM

## 2023-02-24 DIAGNOSIS — Z1211 Encounter for screening for malignant neoplasm of colon: Secondary | ICD-10-CM

## 2023-02-24 DIAGNOSIS — R7303 Prediabetes: Secondary | ICD-10-CM

## 2023-02-24 DIAGNOSIS — Z6841 Body Mass Index (BMI) 40.0 and over, adult: Secondary | ICD-10-CM

## 2023-02-24 DIAGNOSIS — E785 Hyperlipidemia, unspecified: Secondary | ICD-10-CM

## 2023-02-24 DIAGNOSIS — R739 Hyperglycemia, unspecified: Secondary | ICD-10-CM

## 2023-02-24 NOTE — Progress Notes (Signed)
FAMILY MEDICINE, [facility] FAMILY MEDICINE  [address]  [place]  [place] [address]    Medicare Annual Wellness Visit    Name: Toni Tran MRN:  [id number]   Date: 02/24/2023 Age: 67 y.o.       SUBJECTIVE:   Toni Tran is a 67 y.o. female for presenting for Medicare Wellness exam.   I have reviewed and reconciled the medication list with the patient today.    Comprehensive Health Assessment:  Patient verbal responses recorded in flowsheet        No data to display                I have reviewed and updated as appropriate the past medical, family and social history. 02/24/2023 as summarized below:  Past Medical History:   Diagnosis Date    Anxiety     Blood 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, right sided weakness    Depression     History of anesthesia complications     Required overnight stay after tarsal tunnel release d/t hypoxia per pt,     Hypercholesterolemia     Hyperlipidemia     Hypertension     MRSA infection     Obesity     Peripheral edema     right foot    Peripheral neuropathy     Right foot s/p tarsal tunnel release    Plantar fascial fibromatosis of right foot     PONV (postoperative nausea and vomiting)     requests Anti Emetic    RLS (restless legs syndrome)     Snores     Never has had sleep study    Tarsal tunnel syndrome, right     Vitamin D deficiency     Wears glasses      Past Surgical History:   Procedure Laterality Date    Hx appendectomy  childhood    Hx eye surgery Bilateral Childhood    Hx foot surgery Right 06/07/2017    Hx shoulder surgery Right 06/2016    Hx tubal ligation  1992    Knee surgery Left      Current Outpatient Medications   Medication Sig    albuterol sulfate (PROAIR HFA) 90 mcg/actuation Inhalation oral inhaler Take 1-2 Puffs by inhalation Every 6 hours as needed W/ spacer    amLODIPine  (NORVASC) 10 mg Oral Tablet Take 1 Tablet (10 mg total) by mouth Once a day AMLODIPINE BESYLATE    aspirin 81 mg Oral Tablet, Chewable Chew 1 Tablet (81 mg total) Once a day    escitalopram oxalate (LEXAPRO) 10 mg Oral Tablet Take 1 Tablet (10 mg total) by mouth Once a day    HYDROcodone-acetaminophen (NORCO) 5-325 mg Oral Tablet Take 1 Tablet by mouth Every 6 hours as needed for Pain (Patient not taking: Reported on 08/26/2022)    lisinopriL-hydrochlorothiazide (ZESTORETIC) 20-12.5 mg Oral Tablet Take 1 Tablet by mouth Twice daily    naloxone (NARCAN) 4 mg per spray nasal spray 1 Spray by INTRANASAL route Every 2 minutes as needed (Patient not taking: Reported on 08/26/2022)    oxyCODONE-acetaminophen (PERCOCET) 5-325 mg Oral Tablet Take 1 Tablet by mouth Every 4 hours as needed for Pain (Patient not taking: Reported on 08/26/2022)    simvastatin (  ZOCOR) 20 mg Oral Tablet Take 1 Tablet (20 mg total) by mouth Every evening    VENTOLIN HFA 90 mcg/actuation Inhalation HFA Aerosol Inhaler Take 2 Puffs by inhalation Every 6 hours as needed (Patient not taking: Reported on 08/26/2022)     Family Medical History:       Problem Relation (Age of Onset)    Alzheimer's/Dementia Mother, Father    Arthritis-osteo Mother, Father    Blood Clots Father    Breast Cancer Maternal Aunt, Maternal Aunt    Congestive Heart Failure Father    Coronary Artery Disease Father    Diabetes Father    Heart Attack Mother, Father    High Cholesterol Mother, Father    Hypertension (High Blood Pressure) Mother, Father    Migraines Mother, Father    Sleep disorders Father    Stroke Mother, Father            Social History     Socioeconomic History    Marital status: Single   Tobacco Use    Smoking status: Never    Smokeless tobacco: Never   Vaping Use    Vaping status: Never Used   Substance and Sexual Activity    Alcohol use: No    Drug use: No     Social Determinants of Health     Health Literacy: Low Risk  (08/26/2022)    Health Literacy     SDOH  Health Literacy: Never         List of Current Health Care Providers   Care Team       PCP       Name Type Specialty Phone Number    Darius Bump, FNP-BC Nurse Practitioner [place] [phone]              Care Team       No care team found                      Health Maintenance   Topic Date Due    Osteoporosis screening  Never done    Hepatitis C screening  Never done    Colonoscopy  Never done    Shingles Vaccine (1 of 2) Never done    Pneumococcal Vaccination, Age 59+ (1 of 1 - PCV) Never done    Influenza Vaccine (1) 08/13/2022    Covid-19 Vaccine (4 - 2023-24 season) 08/13/2022    Medicare Annual Wellness Visit - Calendar Year Insurers  12/13/2022    Mammography  10/25/2024    Adult Tdap-Td (2 - Td or Tdap) 09/16/2031    Meningococcal Vaccine  Aged Out     Medicare Wellness Assessment   Medicare initial or wellness physical in the last year?: Yes  Advance Directives   Does patient have a living will or MPOA: No           Advance directive information given to the patient today?: Patient Declined      Activities of Daily Living   Do you need help with dressing, bathing, or walking?: No   Do you need help with shopping, housekeeping, medications, or finances?: No   Do you have rugs in hallways, broken steps, or poor lighting?: No   Do you have grab bars in your bathroom, non-slip strips in your tub, and hand rails on your stairs?: Yes   Urinary Incontinence Screen   Do you ever leak urine when you don't want to?: No   Cognitive Function Screen (1=Yes, 0=No)  What is you age?: Correct   What is the time to the nearest hour?: Correct   What is the year?: Correct   What is the name of this clinic?: Correct   Can the patient recognize two persons (the doctor, the nurse, home help, etc.)?: Correct   What is the date of your birth? (day and month sufficient) : Correct   In what year did World War II end?: Correct   Who is the current president of the [place]?: Correct   Count from 20 down to 1?:  Correct   What address did I give you earlier?: Correct   Total Score: 10   Interpretation of Total Score: Greater than 6 Normal   Fall Risk Screen   Do you feel unsteady when standing or walking?: No  Do you worry about falling?: Yes  Have you fallen in the past year?: Yes  How many times have you fallen?: 2 or more times  Were you ever injured from falling?: No   Depression Screen     Little interest or pleasure in doing things.: Not at all  Feeling down, depressed, or hopeless: Not at all  PHQ 2 Total: 0     Pain Score   Pain Score:   0 - No pain    Substance Use-Abuse Screening     Tobacco Use     In Past 12 MONTHS, how often have you used any tobacco product (for example, cigarettes, e-cigarettes, cigars, pipes, or smokeless tobacco)?: Never     Alcohol use     In the PAST 12 MONTHS, how often have you had 5 (men)/4 (women) or more drinks containing alcohol in one day?: Never     Prescription Drug Use     In the PAST 12 months, how often have you used any prescription medications just for the feeling, more than prescribed, or that were not prescribed for you? Prescriptions may include: opioids, benzodiazepines, medications for ADHD: Never           Illicit Drug Use   In the PAST 12 MONTHS, how often have you used any drugs, including marijuana, cocaine or crack, heroin, methamphetamine, hallucinogens, ecstasy/MDMA?: Never                       OBJECTIVE:   BP 136/88   Pulse 80   Temp 36.1 C (97 F)   Wt 93.4 kg (206 lb)   LMP  (LMP Unknown)   SpO2 96%   BMI 36.72 kg/m        Other appropriate exam:    Health Maintenance Due   Topic Date Due    Osteoporosis screening  Never done    Hepatitis C screening  Never done    Colonoscopy  Never done    Shingles Vaccine (1 of 2) Never done    Pneumococcal Vaccination, Age 55+ (1 of 1 - PCV) Never done    Influenza Vaccine (1) 08/13/2022    Covid-19 Vaccine (4 - 2023-24 season) 08/13/2022    Medicare Annual Wellness Visit - Calendar Year Insurers  12/13/2022       ASSESSMENT & PLAN:   No diagnosis found.   Identified Risk Factors/ Recommended Actions     Fall Risk Follow up plan of care: Footwear and potential problems addressed  The PHQ 2 Total: 0 depression screen is interpreted as negative.      Opioid use plan of care:  Opioids use: Plan: Assessment of pain completed and pain controlled and Assessment of pain is completed and medications adjusted    Patient declined Advanced Directives information.        Orders Placed This Encounter    DEXA BONE DENSITOMETRY    Flu Vaccine, 65+,0.7 mL HIGH-DOSE IM (Admin)    Vaxneuvance (PCV 15) (Admin)    FECAL DNA TESTING (AMB)    HGA1C (HEMOGLOBIN A1C WITH EST AVG GLUCOSE)    LIPID PANEL    COMPREHENSIVE METABOLIC PNL, FASTING     Patient due for labs next month, and will get Flu and Pneumonia vaccine.      The patient has been educated about risk factors and recommended preventive care. Written Prevention Plan completed/ updated and given to patient (see After Visit Summary).    No follow-ups on file.    Darius Bump, FNP-BC

## 2023-02-24 NOTE — Nursing Note (Signed)
02/24/23 0800   Medicare Wellness Assessment   Medicare initial or wellness physical in the last year? Yes   Advance Directives   Does patient have a living will or MPOA No   Advance directive information given to the patient today? Patient Declined   Activities of Daily Living   Do you need help with dressing, bathing, or walking? No   Do you need help with shopping, housekeeping, medications, or finances? No   Do you have rugs in hallways, broken steps, or poor lighting? No   Do you have grab bars in your bathroom, non-slip strips in your tub, and hand rails on your stairs? Yes   Urinary Incontinence Screen   Do you ever leak urine when you don't want to? No   Cognitive Function Screen   What is you age? 1   What is the time to the nearest hour? 1   What is the year? 1   What is the name of this clinic? 1   Can the patient recognize two persons (the doctor, the nurse, home help, etc.)? 1   What is the date of your birth? (day and month sufficient)  1   In what year did World War II end? 1   Who is the current president of the [place]? 1   Count from 20 down to 1? 1   What address did I give you earlier? 1   Total Score 10   Interpretation of Total Score Greater than 6 Normal   Depression Screen   Little interest or pleasure in doing things. 0   Feeling down, depressed, or hopeless 0   PHQ 2 Total 0   Pain Score   Pain Score Zero   Substance Use Screening   In Past 12 MONTHS, how often have you used any tobacco product (for example, cigarettes, e-cigarettes, cigars, pipes, or smokeless tobacco)? Never   In the PAST 12 MONTHS, how often have you had 5 (men)/4 (women) or more drinks containing alcohol in one day? Never   In the PAST 12 months, how often have you used any prescription medications just for the feeling, more than prescribed, or that were not prescribed for you? Prescriptions may include: opioids, benzodiazepines, medications for ADHD Never   In the PAST 12 MONTHS, how often have you used any  drugs, including marijuana, cocaine or crack, heroin, methamphetamine, hallucinogens, ecstasy/MDMA? Never   Fall Risk Assessment   Do you feel unsteady when standing or walking? No   Do you worry about falling? Yes   Have you fallen in the past year? Yes   How many times have you fallen? 2 or more times   Were you ever injured from falling? No

## 2023-02-24 NOTE — Nursing Note (Signed)
Chief Complaint:   Chief Complaint              Medicare Annual           Functional Health Screen        Vital Signs  BP 136/88   Pulse 80   Temp 36.1 C (97 F)   Wt 93.4 kg (206 lb)   LMP  (LMP Unknown)   SpO2 96%   BMI 36.72 kg/m       Social History     Tobacco Use   Smoking Status Never   Smokeless Tobacco Never     Allergies  No Known Allergies  Medication History  Reviewed for OTC medication and any new medications, provider will review medication history  Care Team  Patient Care Team:  Darius Bump, FNP-BC as PCP - General (NURSE PRACTITIONER)  Immunizations - last 24 hours       Date Immunization Status Dose Route/Site Given by    06/23/20 0000 Covid-19 Vaccine,Pfizer-BioNTech,Purple Top,90yrs+ Deleted 0.3 mL Intramuscular/Left arm     07/14/20 0000 Covid-19 Vaccine,Pfizer-BioNTech,Purple Top,90yrs+ Deleted 0.3 mL Intramuscular/Left arm     01/15/21 0000 Covid-19 Vaccine,Pfizer-BioNTech,Purple Top,57yrs+ Deleted 0.3 mL Intramuscular/Left arm     06/23/20 0000 Covid-19 Vaccine,Pfizer-BioNTech,Purple Top,41yrs+ Given 0.3 mL Intramuscular/Left arm     07/14/20 0000 Covid-19 Vaccine,Pfizer-BioNTech,Purple Top,13yrs+ Given 0.3 mL Intramuscular/Left arm     01/15/21 0000 Covid-19 Vaccine,Pfizer-BioNTech,Purple Top,58yrs+ Given 0.3 mL Intramuscular/Left arm           [place], [place]  02/24/2023, 08:50

## 2023-02-28 ENCOUNTER — Encounter (HOSPITAL_COMMUNITY): Payer: Self-pay

## 2023-02-28 ENCOUNTER — Other Ambulatory Visit (INDEPENDENT_AMBULATORY_CARE_PROVIDER_SITE_OTHER): Payer: Self-pay | Admitting: Family

## 2023-02-28 DIAGNOSIS — I10 Essential (primary) hypertension: Secondary | ICD-10-CM

## 2023-02-28 DIAGNOSIS — E785 Hyperlipidemia, unspecified: Secondary | ICD-10-CM

## 2023-02-28 MED ORDER — SIMVASTATIN 20 MG TABLET
20.0000 mg | ORAL_TABLET | Freq: Every evening | ORAL | 1 refills | Status: DC
Start: 2023-02-28 — End: 2023-05-15

## 2023-02-28 MED ORDER — LISINOPRIL 20 MG-HYDROCHLOROTHIAZIDE 12.5 MG TABLET
1.0000 | ORAL_TABLET | Freq: Two times a day (BID) | ORAL | 1 refills | Status: DC
Start: 2023-02-28 — End: 2023-05-15

## 2023-02-28 MED ORDER — AMLODIPINE 10 MG TABLET
10.0000 mg | ORAL_TABLET | Freq: Every day | ORAL | 1 refills | Status: DC
Start: 2023-02-28 — End: 2023-05-15

## 2023-02-28 NOTE — Telephone Encounter (Signed)
LOV 02/24/2023    Fran Lowes, CMA

## 2023-03-03 ENCOUNTER — Telehealth (INDEPENDENT_AMBULATORY_CARE_PROVIDER_SITE_OTHER): Payer: Self-pay | Admitting: Family

## 2023-03-03 ENCOUNTER — Other Ambulatory Visit: Payer: Self-pay

## 2023-03-03 ENCOUNTER — Ambulatory Visit: Payer: Medicare Other | Attending: Family

## 2023-03-03 DIAGNOSIS — E785 Hyperlipidemia, unspecified: Secondary | ICD-10-CM | POA: Insufficient documentation

## 2023-03-03 DIAGNOSIS — R7303 Prediabetes: Secondary | ICD-10-CM | POA: Insufficient documentation

## 2023-03-03 LAB — COMPREHENSIVE METABOLIC PNL, FASTING
ALBUMIN: 3.9 g/dL (ref 3.4–4.8)
ALKALINE PHOSPHATASE: 116 U/L (ref 55–145)
ALT (SGPT): 27 U/L — ABNORMAL HIGH (ref 8–22)
ANION GAP: 9 mmol/L (ref 4–13)
AST (SGOT): 18 U/L (ref 8–45)
BILIRUBIN TOTAL: 0.7 mg/dL (ref 0.3–1.3)
BUN/CREA RATIO: 14 (ref 6–22)
BUN: 12 mg/dL (ref 8–25)
CALCIUM: 9.9 mg/dL (ref 8.6–10.3)
CHLORIDE: 102 mmol/L (ref 96–111)
CO2 TOTAL: 27 mmol/L (ref 23–31)
CREATININE: 0.87 mg/dL (ref 0.60–1.05)
ESTIMATED GFR - FEMALE: 73 mL/min/BSA (ref >=60)
GLUCOSE: 189 mg/dL — ABNORMAL HIGH (ref 70–99)
POTASSIUM: 3.6 mmol/L (ref 3.5–5.1)
PROTEIN TOTAL: 7.7 g/dL (ref 6.0–8.0)
SODIUM: 138 mmol/L (ref 136–145)

## 2023-03-03 LAB — LIPID PANEL
CHOL/HDL RATIO: 3.2
CHOLESTEROL: 171 mg/dL (ref 100–200)
HDL CHOL: 53 mg/dL (ref >=50)
LDL CALC: 96 mg/dL (ref <100)
NON-HDL: 118 mg/dL (ref <=190)
TRIGLYCERIDES: 122 mg/dL (ref <150)
VLDL CALC: 20 mg/dL (ref <30)

## 2023-03-03 LAB — HGA1C (HEMOGLOBIN A1C WITH EST AVG GLUCOSE)
ESTIMATED AVERAGE GLUCOSE: 157 mg/dL
HEMOGLOBIN A1C: 7.1 % — ABNORMAL HIGH (ref 4.0–5.6)

## 2023-03-03 NOTE — Telephone Encounter (Signed)
-----   Message from Darius Bump, FNP-BC sent at 03/03/2023  4:47 PM EDT -----  Can we see if Ahsaki can do a video visit to talk about her labs

## 2023-03-11 LAB — COLOGUARD® COLON CANCER SCREEN: COLOGUARD RESULT: NEGATIVE

## 2023-03-11 LAB — FECAL DNA TESTING (AMB): FECAL DNA TEST (AMB): NEGATIVE

## 2023-03-14 ENCOUNTER — Ambulatory Visit: Payer: Medicare Other | Attending: Family | Admitting: Family

## 2023-03-14 ENCOUNTER — Encounter (INDEPENDENT_AMBULATORY_CARE_PROVIDER_SITE_OTHER): Payer: Self-pay | Admitting: Family

## 2023-03-14 DIAGNOSIS — E785 Hyperlipidemia, unspecified: Secondary | ICD-10-CM

## 2023-03-14 DIAGNOSIS — E1165 Type 2 diabetes mellitus with hyperglycemia: Secondary | ICD-10-CM | POA: Insufficient documentation

## 2023-03-14 MED ORDER — MOUNJARO 5 MG/0.5 ML SUBCUTANEOUS PEN INJECTOR
5.0000 mg | PEN_INJECTOR | SUBCUTANEOUS | 0 refills | Status: DC
Start: 2023-03-14 — End: 2023-03-31

## 2023-03-14 NOTE — Progress Notes (Signed)
Name: Toni Tran  MRN: [id number]  DOB: 1956/11/09    Date of Service:  03/14/2023    TELEMEDICINE DOCUMENTATION:  Patient Location:  MyChart video visit from home address: [address]  [place] [address]   Patient/family aware of provider location:  yes  Patient/family consent for telemedicine:  yes  Examination observed and performed by:  Darius Bump, FNP-BC        Chief Complaint:    Chief Complaint   Patient presents with    Abnormal results       History of Present Illness:  Toni Tran is a 67 y.o. female  patient presents today via telemedicine to discuss her recent labs.  Back in September when the patient establish care her A1c was at 6.4 she went to try to work on her diet and not start medication at that time.  Unfortunately her A1c has climbed up to 7.1.  She does have multiple comorbidities including history of a stroke, obesity, dyslipidemia, and hypertension.  We did discuss different options 1 being start the patient on metformin versus going ahead and starting with a weekly G LP.  The patient is agreeable to do the G LP.  We did discuss side effects that can occur with the G LP and how the G LP works.  The patient does want to start the G LP versus metformin.  The patient also does not have a glucometer.  Did explain to the patient that this medicine will not cause hypoglycemia but it is good to do some random blood sugar checks throughout the day usually morning fasting and then maybe a couple after a meal.  She does know how to use glucometer.  Her lipids did improve she was on a statin.  She did have a slightly elevated liver enzyme which most likely is due to her elevated blood sugars.      Patient Active Problem List    Diagnosis    Hemiplegia and hemiparesis following cerebral infarction affecting right dominant side (CMS [place])    Moderate major depression (CMS [facility])    Left knee DJD    Morbid obesity with BMI of 40.0-44.9, adult (CMS HCC)    Chest pain, pleuritic    Acute  bronchitis    Plantar fasciitis of right foot    Hypertension    Hyperlipidemia    Vitamin D deficiency    CVA (cerebrovascular accident) (CMS [place])    Anxiety    Depression    Obesity    Plantar fascial fibromatosis of right foot    Tarsal tunnel syndrome of right side     Past Medical History:   Diagnosis Date    Anxiety     Blood 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, right sided weakness    Depression     History of anesthesia complications     Required overnight stay after tarsal tunnel release d/t hypoxia per pt,     Hypercholesterolemia     Hyperlipidemia     Hypertension     MRSA infection     Obesity     Peripheral edema     right foot    Peripheral neuropathy     Right foot s/p tarsal tunnel release    Plantar fascial fibromatosis  of right foot     PONV (postoperative nausea and vomiting)     requests Anti Emetic    RLS (restless legs syndrome)     Snores     Never has had sleep study    Tarsal tunnel syndrome, right     Vitamin D deficiency     Wears glasses          Past Surgical History:   Procedure Laterality Date    HX APPENDECTOMY  childhood    HX EYE SURGERY Bilateral Childhood    Muscle Surgery    HX FOOT SURGERY Right 06/07/2017    Tarsal Tunnel release; FASCIOTOMY PLANTAR    HX SHOULDER SURGERY Right 06/2016    Exc. Bone Spur, Repair Rotator Cuff Tear    HX TUBAL LIGATION  1992    KNEE SURGERY Left          Current Outpatient Medications   Medication Sig    albuterol sulfate (PROAIR HFA) 90 mcg/actuation Inhalation oral inhaler Take 1-2 Puffs by inhalation Every 6 hours as needed W/ spacer    amLODIPine (NORVASC) 10 mg Oral Tablet Take 1 Tablet (10 mg total) by mouth Once a day AMLODIPINE BESYLATE    aspirin 81 mg Oral Tablet, Chewable Chew 1 Tablet (81 mg total) Once a day    escitalopram oxalate (LEXAPRO) 10 mg Oral Tablet Take 1  Tablet (10 mg total) by mouth Once a day    lisinopriL-hydrochlorothiazide (ZESTORETIC) 20-12.5 mg Oral Tablet Take 1 Tablet by mouth Twice daily    naloxone (NARCAN) 4 mg per spray nasal spray 1 Spray by INTRANASAL route Every 2 minutes as needed (Patient not taking: Reported on 08/26/2022)    simvastatin (ZOCOR) 20 mg Oral Tablet Take 1 Tablet (20 mg total) by mouth Every evening    tirzepatide (MOUNJARO) 5 mg/0.5 mL Subcutaneous Pen Injector Inject 0.5 mL (5 mg total) under the skin Every 7 days for 90 days     No Known Allergies  Family Medical History:       Problem Relation (Age of Onset)    Alzheimer's/Dementia Mother, Father    Arthritis-osteo Mother, Father    Blood Clots Father    Breast Cancer Maternal Aunt, Maternal Aunt    Congestive Heart Failure Father    Coronary Artery Disease Father    Diabetes Father    Heart Attack Mother, Father    High Cholesterol Mother, Father    Hypertension (High Blood Pressure) Mother, Father    Migraines Mother, Father    Sleep disorders Father    Stroke Mother, Father            Social History     Tobacco Use    Smoking status: Never    Smokeless tobacco: Never   Substance Use Topics    Alcohol use: No         Review of Systems:  Constitutional: Denies fevers, chills, night sweats. No recent changes in weight without trying,  No fatigue.   Eyes: Denies change in vision. No irritation, erythema, discharge or pain.   Ears: Denies any difficulty with hearing. No ear pain, tinnitus or discharge.   Cardiovascular: Denies any chest pain, palpitations, or DOE  Respiratory: Denies any shortness of breath, wheezing, cough   GI: Denies any abdominal pain, nausea, vomiting, diarrhea or constipation. No melena or hematochezia.  GU: Denies any changes in bladder function.  Endocrine: Denies any polyuria, polydipsia or polyphagia.   Neurological: No dizziness or headaches.  Emotional/Pscychiatric: Mood overall good. Sleep is adequate.  Skin: Denies any recent rashes or lesions.       Physical Exam:  LMP  (LMP Unknown)       General:  appears in good health, comfortable    Data Reviewed:03/14/2023    Assessment/Plan:    ICD-10-CM    1. Type 2 diabetes mellitus with hyperglycemia, without long-term current use of insulin (CMS HCC)  E11.65 tirzepatide (MOUNJARO) 5 mg/0.5 mL Subcutaneous Pen Injector   We will start the patient on Mounjaro 5 mg weekly.  Again we did discuss side effects that can occur with the G LP and we discussed how the G LP works.  We will titrate the medication up as needed.  We will recheck an A1c in 3 months.  We also discussed possibility of doing that a diabetic education if her A1c does not improve.  We will also send in for a glucometer for patient do some ran some blood sugar checks including some fasting a.m.Marland Kitchen  Patient understands that her average blood sugar is around 200.  We also discussed about diet and trying to get some walking in a couple times a week.  We will also get a Chem 14 in 3 months.    HGA1C (HEMOGLOBIN A1C WITH EST AVG GLUCOSE)   Next visit we will discuss the importance of getting a diabetic foot exam with Podiatry and getting a diabetic eye exam.  COMPREHENSIVE METABOLIC PNL, FASTING     DME - DIABETIC SUPPLIES      2. Hyperlipidemia, unspecified hyperlipidemia type  E78.5 LIPID PANEL   Lipid profile improved  patient's total cholesterol went from 187-171 HDL 52-53, triglycerides 176 down to 122, and LDL went from 105-96.  She will continue her Zocor 20 mg daily.  Again we did test discuss at hearing more to a plant based diet limiting processed foods avoiding foods made with white sugar white flour.  We also discussed limiting salt no added salt to her diet.  We will recheck a lipid profile in 3 months.      Orders Placed This Encounter    HGA1C (HEMOGLOBIN A1C WITH EST AVG GLUCOSE)    LIPID PANEL    COMPREHENSIVE METABOLIC PNL, FASTING    tirzepatide (MOUNJARO) 5 mg/0.5 mL Subcutaneous Pen Injector    DME - DIABETIC SUPPLIES       Follow  up:  Return in about 3 months (around 06/13/2023) for Video Visit/labs prior .      Darius Bump, FNP-BC  03/14/2023, 09:06

## 2023-03-17 ENCOUNTER — Ambulatory Visit: Payer: Medicare Other | Attending: Family

## 2023-03-17 ENCOUNTER — Other Ambulatory Visit: Payer: Self-pay

## 2023-03-17 DIAGNOSIS — E1165 Type 2 diabetes mellitus with hyperglycemia: Secondary | ICD-10-CM | POA: Insufficient documentation

## 2023-03-17 LAB — HGA1C (HEMOGLOBIN A1C WITH EST AVG GLUCOSE)
ESTIMATED AVERAGE GLUCOSE: 154 mg/dL
HEMOGLOBIN A1C: 7 % — ABNORMAL HIGH (ref 4.0–5.6)

## 2023-03-31 ENCOUNTER — Telehealth (HOSPITAL_COMMUNITY): Payer: Self-pay | Admitting: Family

## 2023-03-31 MED ORDER — SEMAGLUTIDE 0.25 MG OR 0.5 MG (2 MG/3 ML) SUBCUTANEOUS PEN INJECTOR
PEN_INJECTOR | SUBCUTANEOUS | 0 refills | Status: DC
Start: 2023-03-31 — End: 2023-06-21

## 2023-03-31 NOTE — Telephone Encounter (Signed)
Patient called to report that Greggory Keen is not available due to a shortage. She states that the pharmacy has been trying to get it in stock but they cannot get it in stock. She reports that ever since the 1st of the month, she has been feeling sick on and off and she feels that it is due to her blood sugar. She would like to know if something else can be ordered.  Estelle June, RN  [facility]  03/31/2023, 12:22

## 2023-03-31 NOTE — Telephone Encounter (Signed)
I sent in Ozempic, not sure if it will be available     Foye Clock

## 2023-04-04 ENCOUNTER — Ambulatory Visit
Admission: RE | Admit: 2023-04-04 | Discharge: 2023-04-04 | Disposition: A | Discharge status: Home / Self Care | Payer: Medicare Other | Source: Ambulatory Visit | Attending: Family | Admitting: Family

## 2023-04-04 ENCOUNTER — Other Ambulatory Visit: Payer: Self-pay

## 2023-04-04 DIAGNOSIS — Z780 Asymptomatic menopausal state: Secondary | ICD-10-CM | POA: Insufficient documentation

## 2023-04-05 ENCOUNTER — Telehealth (INDEPENDENT_AMBULATORY_CARE_PROVIDER_SITE_OTHER): Payer: Self-pay | Admitting: Family

## 2023-04-05 NOTE — Telephone Encounter (Signed)
-----   Message from Jaci Standard, FNP-BC sent at 04/05/2023  9:02 AM EDT -----  Can we let Toni Tran know that her DEXA scan shows osteopenia and that we need to start taking calcium with vitamin-D 600 mg 2 times daily puts her at a risk of a 10 year probability of a hip fracture 4% on osteoporotic fracture 16%.  We will repeat her DEXA in 2 years.

## 2023-05-15 ENCOUNTER — Other Ambulatory Visit (INDEPENDENT_AMBULATORY_CARE_PROVIDER_SITE_OTHER): Payer: Self-pay | Admitting: Family

## 2023-05-15 DIAGNOSIS — E785 Hyperlipidemia, unspecified: Secondary | ICD-10-CM

## 2023-05-15 DIAGNOSIS — I10 Essential (primary) hypertension: Secondary | ICD-10-CM

## 2023-05-16 MED ORDER — SIMVASTATIN 20 MG TABLET
20.0000 mg | ORAL_TABLET | Freq: Every evening | ORAL | 1 refills | Status: DC
Start: 2023-05-16 — End: 2023-08-16

## 2023-05-16 MED ORDER — AMLODIPINE 10 MG TABLET
10.0000 mg | ORAL_TABLET | Freq: Every day | ORAL | 1 refills | Status: DC
Start: 2023-05-16 — End: 2023-08-16

## 2023-05-16 MED ORDER — LISINOPRIL 20 MG-HYDROCHLOROTHIAZIDE 12.5 MG TABLET
1.0000 | ORAL_TABLET | Freq: Two times a day (BID) | ORAL | 1 refills | Status: DC
Start: 2023-05-16 — End: 2023-08-16

## 2023-05-16 NOTE — Telephone Encounter (Signed)
L:OV 03/14/2023    Anders Simmonds, CMA

## 2023-06-10 ENCOUNTER — Ambulatory Visit: Payer: Medicare Other | Attending: Family

## 2023-06-10 ENCOUNTER — Other Ambulatory Visit: Payer: Self-pay

## 2023-06-10 DIAGNOSIS — E785 Hyperlipidemia, unspecified: Secondary | ICD-10-CM | POA: Insufficient documentation

## 2023-06-10 DIAGNOSIS — E1165 Type 2 diabetes mellitus with hyperglycemia: Secondary | ICD-10-CM | POA: Insufficient documentation

## 2023-06-10 LAB — COMPREHENSIVE METABOLIC PNL, FASTING
ALBUMIN: 3.8 g/dL (ref 3.4–4.8)
ALKALINE PHOSPHATASE: 82 U/L (ref 55–145)
ALT (SGPT): 20 U/L (ref 8–22)
ANION GAP: 10 mmol/L (ref 4–13)
AST (SGOT): 14 U/L (ref 8–45)
BILIRUBIN TOTAL: 0.8 mg/dL (ref 0.3–1.3)
BUN/CREA RATIO: 14 (ref 6–22)
BUN: 13 mg/dL (ref 8–25)
CALCIUM: 9.4 mg/dL (ref 8.6–10.3)
CHLORIDE: 105 mmol/L (ref 96–111)
CO2 TOTAL: 23 mmol/L (ref 23–31)
CREATININE: 0.91 mg/dL (ref 0.60–1.05)
ESTIMATED GFR - FEMALE: 69 mL/min/BSA (ref >=60)
GLUCOSE: 209 mg/dL — ABNORMAL HIGH (ref 70–99)
POTASSIUM: 3.8 mmol/L (ref 3.5–5.1)
PROTEIN TOTAL: 7.2 g/dL (ref 6.0–8.0)
SODIUM: 138 mmol/L (ref 136–145)

## 2023-06-10 LAB — LIPID PANEL
CHOL/HDL RATIO: 3.5
CHOLESTEROL: 172 mg/dL (ref 100–200)
HDL CHOL: 49 mg/dL — ABNORMAL LOW (ref >=50)
LDL CALC: 96 mg/dL (ref <100)
NON-HDL: 123 mg/dL (ref <=190)
TRIGLYCERIDES: 153 mg/dL — ABNORMAL HIGH (ref <150)
VLDL CALC: 25 mg/dL (ref <30)

## 2023-06-15 ENCOUNTER — Ambulatory Visit (INDEPENDENT_AMBULATORY_CARE_PROVIDER_SITE_OTHER): Payer: Medicare Other | Admitting: Family

## 2023-06-21 ENCOUNTER — Encounter (INDEPENDENT_AMBULATORY_CARE_PROVIDER_SITE_OTHER): Payer: Self-pay | Admitting: Family

## 2023-06-21 ENCOUNTER — Other Ambulatory Visit: Payer: Self-pay

## 2023-06-21 ENCOUNTER — Ambulatory Visit: Payer: Medicare Other | Attending: Family | Admitting: Family

## 2023-06-21 VITALS — BP 118/82 | HR 84 | Temp 97.3°F | Wt 207.0 lb

## 2023-06-21 DIAGNOSIS — E785 Hyperlipidemia, unspecified: Secondary | ICD-10-CM | POA: Insufficient documentation

## 2023-06-21 DIAGNOSIS — Z7984 Long term (current) use of oral hypoglycemic drugs: Secondary | ICD-10-CM

## 2023-06-21 DIAGNOSIS — E119 Type 2 diabetes mellitus without complications: Secondary | ICD-10-CM | POA: Insufficient documentation

## 2023-06-21 DIAGNOSIS — L729 Follicular cyst of the skin and subcutaneous tissue, unspecified: Secondary | ICD-10-CM | POA: Insufficient documentation

## 2023-06-21 MED ORDER — SEMAGLUTIDE 0.25 MG OR 0.5 MG (2 MG/3 ML) SUBCUTANEOUS PEN INJECTOR
PEN_INJECTOR | SUBCUTANEOUS | 11 refills | Status: DC
Start: 2023-06-21 — End: 2023-07-25

## 2023-06-21 NOTE — Nursing Note (Signed)
Chief Complaint:   Chief Complaint              Lab Results           Functional Health Screen        Vital Signs  BP 118/82   Pulse 84   Temp 36.3 C (97.3 F)   Wt 93.9 kg (207 lb)   LMP  (LMP Unknown)   BMI 36.90 kg/m       Social History     Tobacco Use   Smoking Status Never   Smokeless Tobacco Never     Allergies  No Known Allergies  Medication History  Reviewed for OTC medication and any new medications, provider will review medication history  Care Team  Patient Care Team:  Jaci Standard, FNP-BC as PCP - General (NURSE PRACTITIONER)  Immunizations - last 24 hours       None          [place], [place]  06/21/2023, 08:11
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
## 2023-06-21 NOTE — Progress Notes (Signed)
FAMILY MEDICINE, [facility] FAMILY MEDICINE  [address]  [place] [address]  Operated by [facility]  Progress Note    Name: Toni Tran MRN:  [id number]   Date: 06/21/2023 DOB:  31-Jul-1956 (66 y.o.)             Chief Complaint: Lab Results  Subjective   Subjective:   Patient presents today to go over her labs.  Her A1c was completed back in April it did decrease from 7.1-7.0.  She was on Ozempic however she quit taking the Ozempic because she did not have any refills she did not know she was supposed to have it refilled.  Her chemistry noted labs in the normal range except for a fasting glucose of 209 which do suspect that has from her not taking her Ozempic.  Her lipids noted a cluster total cholesterol 172, and HDL 49 triglycerides of 153 and an LDL of 96.  She currently is on statin therapy.  She was also inquiring a possibility of a referral to Dermatology she does have like a type of cyst growing on top of her head she states that she had 1 years ago and they removed it however it has came back.  It is non fluctuant.      Objective   Objective :  BP 118/82   Pulse 84   Temp 36.3 C (97.3 F)   Wt 93.9 kg (207 lb)   LMP  (LMP Unknown)   BMI 36.90 kg/m       Physical Exam  Constitutional:       Appearance: Normal appearance.   HENT:      Head: Normocephalic.      Comments: She does have noted a enlarged cyst on the top of her scalp on the left side it is hard non fluctuant it is starting to cause her pain.  Does not appear infected.     Right Ear: Tympanic membrane normal.      Left Ear: Tympanic membrane normal.      Mouth/Throat:      Mouth: Mucous membranes are moist.   Eyes:      Extraocular Movements: Extraocular movements intact.      Pupils: Pupils are equal, round, and reactive to light.   Cardiovascular:      Rate and Rhythm: Normal rate.   Pulmonary:      Effort: Pulmonary effort is normal.      Breath sounds: Normal breath sounds.   Skin:     General: Skin is  warm.   Neurological:      Mental Status: She is alert.        Data reviewed:    Current Outpatient Medications   Medication Sig    albuterol sulfate (PROAIR HFA) 90 mcg/actuation Inhalation oral inhaler Take 1-2 Puffs by inhalation Every 6 hours as needed W/ spacer    amLODIPine (NORVASC) 10 mg Oral Tablet Take 1 Tablet (10 mg total) by mouth Once a day AMLODIPINE BESYLATE    aspirin 81 mg Oral Tablet, Chewable Chew 1 Tablet (81 mg total) Once a day    escitalopram oxalate (LEXAPRO) 10 mg Oral Tablet Take 1 Tablet (10 mg total) by mouth Once a day    lisinopriL-hydrochlorothiazide (ZESTORETIC) 20-12.5 mg Oral Tablet Take 1 Tablet by mouth Twice daily    naloxone (NARCAN) 4 mg per spray nasal spray 1 Spray by INTRANASAL route Every 2 minutes as needed (Patient not taking: Reported on 08/26/2022)  semaglutide (OZEMPIC) 0.25 mg or 0.5 mg (2 mg/3 mL) Subcutaneous Pen Injector Inject 0.25 mg under the skin Every 7 days for 28 days, THEN 0.5 mg Every 7 days for 14 days.    simvastatin (ZOCOR) 20 mg Oral Tablet Take 1 Tablet (20 mg total) by mouth Every evening        Assessment/Plan  Problem List Items Addressed This Visit    None  Visit Diagnoses       Type 2 diabetes mellitus (CMS HCC)    -  Primary    We will get the patient back on the semaglutide at 0.25 mg and then increase her to 0.5.  She thought that was only for 1 month she did not know to refill her medications a suspect her A1c has went up since she had a drawn in April as a fasting blood sugar was 209.  Again we talked about diet and exercise avoiding processed foods, foods made with white flour, white sugar.  We will recheck an A1c, a microalbumin, and a Chem 14 and a proximally 3 months.  The patient has made a follow up appointment November at [facility] office.    semaglutide (OZEMPIC) 0.25 mg or 0.5 mg (2 mg/3 mL) Subcutaneous Pen Injector    Other Relevant Orders    HGA1C (HEMOGLOBIN A1C WITH EST AVG GLUCOSE)    MICROALBUMIN/CREATININE RATIO, URINE,  RANDOM    COMPREHENSIVE METABOLIC PANEL, NON-FASTING    2. Scalp cyst        Referral placed for Dermatology to possibility of having this removed.    Refer to External Provider          3. Hyperlipidemia  Patient is currently on statin therapy her lipids did elevate slightly and her triglycerides which is probably mostly directed due to elevated blood sugars.  Again we talked about diet and exercise.  We will recheck a lipid panel in about 6 months.     Follow up in about 3 months have labs done drawn prior to appointment.    Jaci Standard, FNP-BC

## 2023-06-21 NOTE — Nursing Note (Signed)
06/21/23 0811   PHQ 9 (follow up)   Little interest or pleasure in doing things. 0   Feeling down, depressed, or hopeless 0

## 2023-07-25 ENCOUNTER — Other Ambulatory Visit (INDEPENDENT_AMBULATORY_CARE_PROVIDER_SITE_OTHER): Payer: Self-pay | Admitting: Family

## 2023-07-25 DIAGNOSIS — E119 Type 2 diabetes mellitus without complications: Secondary | ICD-10-CM

## 2023-07-26 MED ORDER — SEMAGLUTIDE 0.25 MG OR 0.5 MG (2 MG/3 ML) SUBCUTANEOUS PEN INJECTOR
PEN_INJECTOR | SUBCUTANEOUS | 11 refills | Status: DC
Start: 2023-07-26 — End: 2023-09-03

## 2023-07-27 ENCOUNTER — Other Ambulatory Visit (INDEPENDENT_AMBULATORY_CARE_PROVIDER_SITE_OTHER): Payer: Self-pay | Admitting: FAMILY MEDICINE

## 2023-07-27 DIAGNOSIS — E785 Hyperlipidemia, unspecified: Secondary | ICD-10-CM

## 2023-07-27 DIAGNOSIS — I10 Essential (primary) hypertension: Secondary | ICD-10-CM

## 2023-07-29 ENCOUNTER — Ambulatory Visit (INDEPENDENT_AMBULATORY_CARE_PROVIDER_SITE_OTHER): Payer: Self-pay | Admitting: Cardiovascular Disease

## 2023-07-29 DIAGNOSIS — R079 Chest pain, unspecified: Secondary | ICD-10-CM

## 2023-07-29 DIAGNOSIS — R0602 Shortness of breath: Secondary | ICD-10-CM

## 2023-07-29 NOTE — Addendum Note (Signed)
Addended by: Laverta Baltimore on: 07/29/2023 10:20 AM     Modules accepted: Orders

## 2023-07-29 NOTE — Telephone Encounter (Signed)
Re ordering ST for Ancillary to be done at [facility]

## 2023-08-16 ENCOUNTER — Other Ambulatory Visit (INDEPENDENT_AMBULATORY_CARE_PROVIDER_SITE_OTHER): Payer: Self-pay | Admitting: FAMILY MEDICINE

## 2023-08-16 DIAGNOSIS — E785 Hyperlipidemia, unspecified: Secondary | ICD-10-CM

## 2023-08-16 DIAGNOSIS — I10 Essential (primary) hypertension: Secondary | ICD-10-CM

## 2023-08-16 MED ORDER — SIMVASTATIN 20 MG TABLET
20.0000 mg | ORAL_TABLET | Freq: Every evening | ORAL | 0 refills | Status: DC
Start: 2023-08-16 — End: 2023-10-20

## 2023-08-16 MED ORDER — LISINOPRIL 20 MG-HYDROCHLOROTHIAZIDE 12.5 MG TABLET
1.0000 | ORAL_TABLET | Freq: Two times a day (BID) | ORAL | 0 refills | Status: DC
Start: 2023-08-16 — End: 2023-10-20

## 2023-08-16 MED ORDER — AMLODIPINE 10 MG TABLET
10.0000 mg | ORAL_TABLET | Freq: Every day | ORAL | 0 refills | Status: DC
Start: 2023-08-16 — End: 2023-10-20

## 2023-08-26 ENCOUNTER — Other Ambulatory Visit: Payer: Self-pay

## 2023-08-26 ENCOUNTER — Ambulatory Visit
Admission: RE | Admit: 2023-08-26 | Discharge: 2023-08-26 | Disposition: A | Discharge status: Home / Self Care | Payer: Medicare Other | Source: Ambulatory Visit | Attending: Cardiovascular Disease | Admitting: Cardiovascular Disease

## 2023-08-26 DIAGNOSIS — Z9289 Personal history of other medical treatment: Secondary | ICD-10-CM

## 2023-08-26 DIAGNOSIS — R079 Chest pain, unspecified: Secondary | ICD-10-CM | POA: Insufficient documentation

## 2023-08-26 DIAGNOSIS — R0602 Shortness of breath: Secondary | ICD-10-CM | POA: Insufficient documentation

## 2023-08-26 HISTORY — DX: Personal history of other medical treatment: Z92.89

## 2023-09-03 ENCOUNTER — Other Ambulatory Visit (INDEPENDENT_AMBULATORY_CARE_PROVIDER_SITE_OTHER): Payer: Self-pay | Admitting: FAMILY MEDICINE

## 2023-09-03 DIAGNOSIS — E119 Type 2 diabetes mellitus without complications: Secondary | ICD-10-CM

## 2023-09-05 ENCOUNTER — Other Ambulatory Visit: Payer: Self-pay

## 2023-09-05 ENCOUNTER — Inpatient Hospital Stay
Admission: RE | Admit: 2023-09-05 | Discharge: 2023-09-05 | Disposition: A | Discharge status: Home / Self Care | Payer: Medicare Other | Source: Ambulatory Visit | Attending: Cardiovascular Disease | Admitting: Cardiovascular Disease

## 2023-09-05 DIAGNOSIS — Z9289 Personal history of other medical treatment: Secondary | ICD-10-CM

## 2023-09-05 DIAGNOSIS — R079 Chest pain, unspecified: Secondary | ICD-10-CM | POA: Insufficient documentation

## 2023-09-05 DIAGNOSIS — R0602 Shortness of breath: Secondary | ICD-10-CM | POA: Insufficient documentation

## 2023-09-05 HISTORY — DX: Personal history of other medical treatment: Z92.89

## 2023-09-05 LAB — MYOCARDIAL PERFUSION COMPLETE
% MPHR: 92 %
ANGINA HISTORY DIAMOND METHOD (ATYPICAL 3): 3
ANGINA HISTORY DIAMOND METHOD (NON-ANGINAL 1): 0
ANGINA HISTORY DIAMOND METHOD (TYPICAL 5): 0
Baseline DBP: 84
Baseline HR: 100 {beats}/min
Baseline HR: 97 {beats}/min
Baseline SBP: 155
DIABETES? (2): 0
ESTROGEN STATUS (3): 0
ESTROGEN STATUS (NEG -3): 3
Estimated workload: 4.6 METS
Exercise duration (min): 1 min
Exercise duration (sec): 56 s
FAMILY HX CAD 1? (1): 1
HYPERLIPIDEMIA? (1): 1
HYPERTENSION? (1): 1
LEXI STRESS PEAK SBP: 135
M 45-54 OR F 50-64 (6): 0
M<40orF<50: 0
M>55 OR F>65 (9): 9
Nuc Stress EF: 82 %
OBESITY? BMI>27? (1): 1
PRE MORISE SCORE TOTAL: 19
Peak DBP: 50
Peak Diastolic BP for Stress Tests: 84 mm/Hg
Peak HR: 121 {beats}/min
Peak Systolic BP Stress Test: 155 mm/Hg
Post peak HR: 141 {beats}/min
Predicted Max HR: 153 {beats}/min
RECOVERY HR 2 MINS: 116 {beats}/min
RPP: 21855
Recovery HR: 118 {beats}/min
SMOKING (ANY)? (1): 0
ST DEPRESSION - RECOVERY: 0
ST DEPRESSION - STRESS: 0 mm
TID: 0.68
Target HR: 130 {beats}/min

## 2023-09-05 MED ORDER — SODIUM CHLORIDE 0.9 % (FLUSH) INJECTION SYRINGE
10.0000 mL | INJECTION | INTRAMUSCULAR | Status: DC
Start: 2023-09-05 — End: 2023-09-06
  Administered 2023-09-05: 10 mL via INTRAVENOUS

## 2023-09-05 MED ORDER — REGADENOSON 0.4 MG/5 ML INTRAVENOUS SYRINGE
0.4000 mg | INJECTION | INTRAVENOUS | Status: AC
Start: 2023-09-05 — End: 2023-09-05
  Administered 2023-09-05: 0.4 mg via INTRAVENOUS

## 2023-09-05 MED ORDER — SODIUM CHLORIDE 0.9 % (FLUSH) INJECTION SYRINGE
10.0000 mL | INJECTION | INTRAMUSCULAR | Status: DC
Start: 2023-09-05 — End: 2023-09-06

## 2023-09-05 MED ORDER — SEMAGLUTIDE 0.25 MG OR 0.5 MG (2 MG/3 ML) SUBCUTANEOUS PEN INJECTOR
PEN_INJECTOR | SUBCUTANEOUS | 1 refills | Status: DC
Start: 2023-09-05 — End: 2023-10-17

## 2023-09-05 MED ORDER — REGADENOSON 0.4 MG/5 ML INTRAVENOUS SYRINGE
0.4000 mg | INJECTION | INTRAVENOUS | Status: DC
Start: 2023-09-05 — End: 2023-09-06

## 2023-09-21 ENCOUNTER — Other Ambulatory Visit (HOSPITAL_COMMUNITY): Payer: Medicare Other

## 2023-09-21 ENCOUNTER — Encounter (HOSPITAL_COMMUNITY): Payer: Self-pay | Admitting: Cardiovascular Disease

## 2023-09-21 ENCOUNTER — Other Ambulatory Visit: Payer: Self-pay

## 2023-09-21 ENCOUNTER — Ambulatory Visit: Payer: Medicare Other | Attending: Cardiovascular Disease | Admitting: Cardiovascular Disease

## 2023-09-21 VITALS — BP 123/78 | HR 92 | Ht 62.0 in | Wt 200.6 lb

## 2023-09-21 DIAGNOSIS — R600 Localized edema: Secondary | ICD-10-CM | POA: Insufficient documentation

## 2023-09-21 DIAGNOSIS — I69351 Hemiplegia and hemiparesis following cerebral infarction affecting right dominant side: Secondary | ICD-10-CM

## 2023-09-21 DIAGNOSIS — E119 Type 2 diabetes mellitus without complications: Secondary | ICD-10-CM

## 2023-09-21 DIAGNOSIS — I10 Essential (primary) hypertension: Secondary | ICD-10-CM | POA: Insufficient documentation

## 2023-09-21 DIAGNOSIS — E785 Hyperlipidemia, unspecified: Secondary | ICD-10-CM | POA: Insufficient documentation

## 2023-09-21 DIAGNOSIS — I639 Cerebral infarction, unspecified: Secondary | ICD-10-CM | POA: Insufficient documentation

## 2023-09-21 LAB — COMPREHENSIVE METABOLIC PANEL, NON-FASTING
ALBUMIN: 3.9 g/dL (ref 3.4–4.8)
ALKALINE PHOSPHATASE: 80 U/L (ref 55–145)
ALT (SGPT): 27 U/L — ABNORMAL HIGH (ref 8–22)
ANION GAP: 8 mmol/L (ref 4–13)
AST (SGOT): 18 U/L (ref 8–45)
BILIRUBIN TOTAL: 0.7 mg/dL (ref 0.3–1.3)
BUN/CREA RATIO: 14 (ref 6–22)
BUN: 12 mg/dL (ref 8–25)
CALCIUM: 9.2 mg/dL (ref 8.6–10.3)
CHLORIDE: 109 mmol/L (ref 96–111)
CO2 TOTAL: 24 mmol/L (ref 23–31)
CREATININE: 0.84 mg/dL (ref 0.60–1.05)
ESTIMATED GFR - FEMALE: 76 mL/min/BSA (ref >=60)
GLUCOSE: 148 mg/dL — ABNORMAL HIGH (ref 65–125)
POTASSIUM: 4 mmol/L (ref 3.5–5.1)
PROTEIN TOTAL: 7.4 g/dL (ref 6.0–8.0)
SODIUM: 141 mmol/L (ref 136–145)

## 2023-09-21 LAB — MICROALBUMIN/CREATININE RATIO, URINE, RANDOM
CREATININE RANDOM URINE: 268 mg/dL
MICROALBUMIN RANDOM URINE: 3.8 mg/dL
MICROALBUMIN/CREATININE RATIO RANDOM URINE: 14.2 mg/g (ref <30.0)

## 2023-09-21 LAB — HGA1C (HEMOGLOBIN A1C WITH EST AVG GLUCOSE)
ESTIMATED AVERAGE GLUCOSE: 100 mg/dL
HEMOGLOBIN A1C: 5.1 % (ref 4.0–5.6)

## 2023-10-17 ENCOUNTER — Other Ambulatory Visit (INDEPENDENT_AMBULATORY_CARE_PROVIDER_SITE_OTHER): Payer: Self-pay | Admitting: FAMILY MEDICINE

## 2023-10-17 DIAGNOSIS — E119 Type 2 diabetes mellitus without complications: Secondary | ICD-10-CM

## 2023-10-17 MED ORDER — SEMAGLUTIDE 0.25 MG OR 0.5 MG (2 MG/3 ML) SUBCUTANEOUS PEN INJECTOR
0.5000 mg | PEN_INJECTOR | SUBCUTANEOUS | 0 refills | Status: DC
Start: 2023-10-17 — End: 2023-11-21

## 2023-10-20 ENCOUNTER — Encounter (INDEPENDENT_AMBULATORY_CARE_PROVIDER_SITE_OTHER): Payer: Self-pay

## 2023-10-20 ENCOUNTER — Ambulatory Visit: Payer: Medicare Other

## 2023-10-20 ENCOUNTER — Other Ambulatory Visit: Payer: Self-pay

## 2023-10-20 VITALS — BP 148/90 | HR 86 | Temp 97.8°F | Resp 16 | Ht 62.0 in | Wt 198.0 lb

## 2023-10-20 DIAGNOSIS — E559 Vitamin D deficiency, unspecified: Secondary | ICD-10-CM | POA: Insufficient documentation

## 2023-10-20 DIAGNOSIS — E785 Hyperlipidemia, unspecified: Secondary | ICD-10-CM | POA: Insufficient documentation

## 2023-10-20 DIAGNOSIS — Z23 Encounter for immunization: Secondary | ICD-10-CM | POA: Insufficient documentation

## 2023-10-20 DIAGNOSIS — E119 Type 2 diabetes mellitus without complications: Secondary | ICD-10-CM | POA: Insufficient documentation

## 2023-10-20 DIAGNOSIS — F419 Anxiety disorder, unspecified: Secondary | ICD-10-CM | POA: Insufficient documentation

## 2023-10-20 DIAGNOSIS — G4733 Obstructive sleep apnea (adult) (pediatric): Secondary | ICD-10-CM | POA: Insufficient documentation

## 2023-10-20 DIAGNOSIS — Z8673 Personal history of transient ischemic attack (TIA), and cerebral infarction without residual deficits: Secondary | ICD-10-CM | POA: Insufficient documentation

## 2023-10-20 DIAGNOSIS — E669 Obesity, unspecified: Secondary | ICD-10-CM | POA: Insufficient documentation

## 2023-10-20 DIAGNOSIS — Z7985 Long-term (current) use of injectable non-insulin antidiabetic drugs: Secondary | ICD-10-CM

## 2023-10-20 DIAGNOSIS — F32A Depression, unspecified: Secondary | ICD-10-CM | POA: Insufficient documentation

## 2023-10-20 DIAGNOSIS — R233 Spontaneous ecchymoses: Secondary | ICD-10-CM | POA: Insufficient documentation

## 2023-10-20 DIAGNOSIS — I10 Essential (primary) hypertension: Secondary | ICD-10-CM | POA: Insufficient documentation

## 2023-10-20 DIAGNOSIS — Z6836 Body mass index (BMI) 36.0-36.9, adult: Secondary | ICD-10-CM

## 2023-10-20 DIAGNOSIS — Z1231 Encounter for screening mammogram for malignant neoplasm of breast: Secondary | ICD-10-CM | POA: Insufficient documentation

## 2023-10-20 DIAGNOSIS — Z7689 Persons encountering health services in other specified circumstances: Secondary | ICD-10-CM | POA: Insufficient documentation

## 2023-10-20 DIAGNOSIS — M79604 Pain in right leg: Secondary | ICD-10-CM | POA: Insufficient documentation

## 2023-10-20 MED ORDER — LISINOPRIL 20 MG-HYDROCHLOROTHIAZIDE 12.5 MG TABLET
1.0000 | ORAL_TABLET | Freq: Two times a day (BID) | ORAL | 1 refills | Status: DC
Start: 2023-10-20 — End: 2024-01-18

## 2023-10-20 MED ORDER — AMLODIPINE 10 MG TABLET
10.0000 mg | ORAL_TABLET | Freq: Every day | ORAL | 1 refills | Status: DC
Start: 2023-10-20 — End: 2024-01-18

## 2023-10-20 MED ORDER — SIMVASTATIN 20 MG TABLET
20.0000 mg | ORAL_TABLET | Freq: Every evening | ORAL | 1 refills | Status: DC
Start: 2023-10-20 — End: 2024-01-18

## 2023-10-20 NOTE — Patient Instructions (Addendum)
Arm Cuff. Recommend Omron 3 BP cuff - can purchase on [employer].                                                                    Blood Pressure Record     Systolic  Diastolic     Date Day Time Pressure Pressure Heart Rate                                                                                                                                                                                                                                                                                                                                       Please record your blood pressures and heart rate twice daily for the next two weeks and return them to Vilinda Boehringer, PA-C at [facility]

## 2023-10-20 NOTE — Progress Notes (Signed)
FAMILY MEDICINE, [facility] FAMILY MEDICINE  [address] [place]  [place] [address]  Operated by [facility]    Encounter Date: 10/20/2023      Patient ID:  Toni Tran   MRN: [id number]    DOB: [birth year]   Age: 67 y.o.      Subjective:     Chief Complaint              Establish Care              HPI:  This is a 67 y.o. female here to establish care. PMH of hyperlipidemia, hypertension, OSA, CVA, DM type II, vitamin d deficiency, depression, anxiety, and obesity. Complaining of anterior right leg pain for >7 months. Denies chest pain, shortness of breath, nausea, vomiting, diarrhea, changes in bowel/bladder function, changes in oral intake, insomnia, or mental health concerns. Denies erythema/swelling to lower extremities.     Current Outpatient Medications   Medication Sig    amLODIPine (NORVASC) 10 mg Oral Tablet Take 1 Tablet (10 mg total) by mouth Once a day AMLODIPINE BESYLATE    aspirin 81 mg Oral Tablet, Chewable Chew 1 Tablet (81 mg total) Once a day    lisinopriL-hydrochlorothiazide (ZESTORETIC) 20-12.5 mg Oral Tablet Take 1 Tablet by mouth Twice daily    semaglutide (OZEMPIC) 0.25 mg or 0.5 mg (2 mg/3 mL) Subcutaneous Pen Injector Inject 0.5 mg under the skin Every 7 days    simvastatin (ZOCOR) 20 mg Oral Tablet Take 1 Tablet (20 mg total) by mouth Every evening       No Known Allergies   Past Medical History:   Diagnosis Date    Anxiety     Blood 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, right sided weakness    Depression     History of anesthesia complications     Required overnight stay after tarsal tunnel release d/t hypoxia per pt,     Hypercholesterolemia     Hyperlipidemia     Hypertension     MRSA infection     Obesity     Peripheral edema     right foot    Peripheral neuropathy     Right foot s/p tarsal tunnel release     Plantar fascial fibromatosis of right foot     PONV (postoperative nausea and vomiting)     requests Anti Emetic    RLS (restless legs syndrome)     Snores     Never has had sleep study    Tarsal tunnel syndrome, right     Vitamin D deficiency     Wears glasses       Past Surgical History:   Procedure Laterality Date    HX APPENDECTOMY  childhood    HX EYE SURGERY Bilateral Childhood    Muscle Surgery    HX FOOT SURGERY Right 06/07/2017    Tarsal Tunnel release; FASCIOTOMY PLANTAR    HX SHOULDER SURGERY Right 06/2016    Exc. Bone Spur, Repair Rotator Cuff Tear    HX TUBAL LIGATION  1992    KNEE SURGERY Left       Family Medical History:       Problem Relation (Age of Onset)    Alzheimer's/Dementia Mother, Father    Arthritis-osteo Mother, Father  Blood Clots Father    Breast Cancer Maternal Aunt, Maternal Aunt    Congestive Heart Failure Father    Coronary Artery Disease Father    Diabetes Father    Heart Attack Mother, Father    High Cholesterol Mother, Father    Hypertension (High Blood Pressure) Mother, Father    Migraines Mother, Father    Sleep disorders Father    Stroke Mother, Father            Social History     Socioeconomic History    Marital status: Single   Tobacco Use    Smoking status: Never    Smokeless tobacco: Never   Vaping Use    Vaping status: Never Used   Substance and Sexual Activity    Alcohol use: No    Drug use: No     Social Determinants of Health     Health Literacy: Low Risk  (10/20/2023)    Health Literacy     SDOH Health Literacy: Never      ROS   All pertinent review of systems both positive and negative as per HPI.      Objective:   Vitals: BP (!) 148/90   Pulse 86   Temp 36.6 C (97.8 F) (Thermal Scan)   Resp 16   Ht 1.575 m (5\' 2" )   Wt 89.8 kg (198 lb)   LMP  (LMP Unknown)   SpO2 99%   BMI 36.21 kg/m     Physical Exam  Vitals and nursing note reviewed.   Constitutional:       Appearance: She is well-developed.   HENT:      Head: Normocephalic and atraumatic.   Eyes:       Conjunctiva/sclera: Conjunctivae normal.   Neck:      Vascular: No JVD.   Cardiovascular:      Rate and Rhythm: Normal rate and regular rhythm.      Heart sounds: No murmur heard.     No friction rub. No gallop.   Pulmonary:      Effort: Pulmonary effort is normal. No respiratory distress.      Breath sounds: No stridor. No wheezing, rhonchi or rales.   Abdominal:      General: There is no distension.      Palpations: Abdomen is soft.   Musculoskeletal:      Cervical back: Normal range of motion.   Skin:     General: Skin is warm and dry.   Neurological:      Mental Status: She is alert and oriented to person, place, and time.   Psychiatric:         Mood and Affect: Mood normal.         Behavior: Behavior normal.         Thought Content: Thought content normal.         Judgment: Judgment normal.            Assessment & Plan:     ENCOUNTER DIAGNOSES     ICD-10-CM   1. Encounter to establish care  Z76.89   2. Diabetes mellitus type II, controlled (CMS HCC)  E11.9   3. Dyslipidemia  E78.5   4. Primary hypertension  I10   5. OSA (obstructive sleep apnea)  G47.33   6. History of CVA (cerebrovascular accident)  Z86.73   7. Vitamin D deficiency  E55.9   8. Depression, unspecified depression type  F32.A   9. Anxiety  F41.9   10.  Obesity, unspecified class, unspecified obesity type, unspecified whether serious comorbidity present  E66.9   11. Petechiae  R23.3   12. Right leg pain  M79.604   13. Need for vaccination  Z23   14. Breast cancer screening by mammogram  Z12.31      Orders Placed This Encounter    XR TIBIA-FIBULA RIGHT    MAMMO BILATERAL SCREENING-ADDL VIEWS/BREAST US AS REQ BY RAD    Flu Vaccine High-Dose, 65+,0.5 mL IM (Admin)    C-REACTIVE PROTEIN(CRP),INFLAMMATION    SEDIMENTATION RATE    RHEUMATOID FACTOR, SERUM    HEP-2 SUBSTRATE ANTINUCLEAR ANTIBODIES (ANA), SERUM    VITAMIN D 25 TOTAL    Refer to [facility]    Refer to [facility]    Refer  for Diabetic Eye Exam    amLODIPine (NORVASC) 10 mg Oral Tablet    lisinopriL-hydrochlorothiazide (ZESTORETIC) 20-12.5 mg Oral Tablet    simvastatin (ZOCOR) 20 mg Oral Tablet      This is a 67 y.o. female here with/to    Establish care/age appropriate screening   -Mammogram  -Flu vaccine administered     Diabetes type II/dyslipidemia  -A1C 5.1 on 09/21/23  -Continue Ozempic 0.5 mg subcutaneous once weekly   -Continue Zocor 20 mg p.o. q.pm   -Referral for DM eye exam    Hypertension, uncontrolled  -Continue Norvasc 10 mg p.o. q.d   -Continue Zestoretic 20-12.5 mg p.o. b.i.d  -BP log     OSA  -CPAP?  -Referral to Pulmonology     Hx of CVA  -Emphasize importance of control of HTN and DM  -Continue Aspirin 81 mg p.o. q.d   -Continue Norvasc 10 mg p.o. q.d   -Continue Zestoretic 20-12.5 mg p.o. b.i.d  -Continue Zocor 20 mg p.o q.pm     Vitamin D deficiency  -Vitamin d level    Depression/anxiety  -Stable     Obesity  -Lifestyle modifications/Ozempic as above     Petechia of lower extremities  -CRP  -ESR  -RF  -ANA  -Referral to Vascular     Right leg pain  -XR tib/fib right     Follow-up in 2 months, after January 9th for chronic management/A1C    Vilinda Boehringer, PA-C

## 2023-10-20 NOTE — Nursing Note (Signed)
Chief Complaint:   Chief Complaint              Establish Care           Functional Health Screen  Review Flowsheet  More data exists         10/20/2023   FUNCTIONAL HEALTH SCREENING   How often do you have a problem understanding what is told to you about your medical condition?  Never   Because we are aware of abuse and domestic violence today, we ask all patients: Are you being hurt, hit, or frightened by anyone at your home or in your life?  N   Do you have any basic needs within your home that are not being met? (such as Food, Shelter, [employer] fast streamer, Tranportation, paying for bills and/or medications) N   Do you have any advanced directives? No Advance   Would you like an advanced directive packet? Refused Packet      Details                 BP (!) 148/90   Pulse 86   Temp 36.6 C (97.8 F) (Thermal Scan)   Resp 16   Ht 1.575 m (5\' 2" )   Wt 89.8 kg (198 lb)   LMP  (LMP Unknown)   SpO2 99%   BMI 36.21 kg/m       Social History     Tobacco Use   Smoking Status Never   Smokeless Tobacco Never     Patient Health Rating           Depression Screening  PHQ Questionnaire  Little interest or pleasure in doing things.: Not at all  Feeling down, depressed, or hopeless: Not at all  PHQ 2 Total: 0  Trouble falling or staying asleep, or sleeping too much.: Nearly every day  Feeling tired or having little energy: More than half the days  Poor appetite or overeating: Not at all  Feeling bad about yourself/ that you are a failure in the past 2 weeks?: Not at all  Trouble concentrating on things in the past 2 weeks?: Not at all  Moving/Speaking slowly or being fidgety or restless  in the past 2 weeks?: Not at all  Thoughts that you would be better off DEAD, or of hurting yourself in some way.: Not at all  PHQ 9 Total: 5  Interpretation of Total Score: 5-9 Mild depression  Allergies:  No Known Allergies  Medication History  Reviewed for OTC medication and any new medications, provider will review medication history  Results  through Enter/Edit  No results found for this or any previous visit (from the past 24 hour(s)).  POCT Results  Care Team  Patient Care Team:  Medicine, [facility] as PCP - General (MULTISPECIALTY CLINIC OR GROUP PRACTICE)  Immunizations - last 24 hours       None          Legrand Rams, [place]  10/20/2023, 12:58

## 2023-10-28 ENCOUNTER — Ambulatory Visit (INDEPENDENT_AMBULATORY_CARE_PROVIDER_SITE_OTHER): Payer: Self-pay

## 2023-11-07 ENCOUNTER — Other Ambulatory Visit: Payer: Self-pay

## 2023-11-07 ENCOUNTER — Ambulatory Visit: Payer: Medicare Other | Attending: CRITICAL CARE MEDICINE | Admitting: CRITICAL CARE MEDICINE

## 2023-11-07 ENCOUNTER — Encounter (HOSPITAL_COMMUNITY): Payer: Self-pay | Admitting: CRITICAL CARE MEDICINE

## 2023-11-07 VITALS — BP 127/79 | HR 68 | Temp 98.2°F | Resp 16 | Ht 62.0 in | Wt 198.0 lb

## 2023-11-07 DIAGNOSIS — E669 Obesity, unspecified: Secondary | ICD-10-CM | POA: Insufficient documentation

## 2023-11-07 DIAGNOSIS — G4733 Obstructive sleep apnea (adult) (pediatric): Secondary | ICD-10-CM | POA: Insufficient documentation

## 2023-11-07 DIAGNOSIS — I10 Essential (primary) hypertension: Secondary | ICD-10-CM | POA: Insufficient documentation

## 2023-11-07 NOTE — Nursing Note (Signed)
BP 127/79   Pulse 68   Temp 36.8 C (98.2 F) (Thermal Scan)   Resp 16   Ht 1.575 m (5\' 2" )   Wt 89.8 kg (198 lb)   LMP  (LMP Unknown)   SpO2 98%   BMI 36.21 kg/m   Kriste Basque, LPN

## 2023-11-07 NOTE — H&P (Signed)
PULMONARY, [facility]  [address]  [place] [address]  Operated by [facility]     Name: Toni Tran MRN:  [id number]   Date of Birth: 14-Mar-1956 Age: 67 y.o.     Date of Service: 11/07/2023     Provider: Flint Melter, MD  PCP: Vilinda Boehringer, PA-C  Referring Provider: Vilinda Boehringer     Reason for visit: New Patient    History of Present Illness:   Toni Tran is a 67 y.o. female presenting with POOR SLEEP.    Patient is a 67 year old female, obese, was diagnosed with obstructive sleep apnea and prescribed with CPAP/APAP., patient has not been able to use it from the beginning due to the fullface mask is quite uncomfortable.    Patient carries a diagnosis of difficult control arterial hypertension, metabolic syndrome X.    Patient has noticed restless nights, early morning cephalgias, and excessive daytime sleepiness.    Patient denies any chest pain, palpitations, PND, orthopnea.  Patient denies any chronic cough, hemoptysis, or debilitating dyspnea.  Patient denies any GI, GU or musculoskeletal symptoms..    Past Medical History:     Past Medical History:   Diagnosis Date    Anxiety     Blood 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, right sided weakness    Depression     History of anesthesia complications     Required overnight stay after tarsal tunnel release d/t hypoxia per pt,     Hypercholesterolemia     Hyperlipidemia     Hypertension     MRSA infection     Obesity     Peripheral edema     right foot    Peripheral neuropathy     Right foot s/p tarsal tunnel release    Plantar fascial fibromatosis of right foot     PONV (postoperative nausea and vomiting)     requests Anti Emetic    RLS (restless legs syndrome)     Snores     Never has had sleep study    Tarsal tunnel syndrome, right     Vitamin D deficiency      Wears glasses       Past Surgical History:     Past Surgical History:   Procedure Laterality Date    HX APPENDECTOMY  childhood    HX EYE SURGERY Bilateral Childhood    Muscle Surgery    HX FOOT SURGERY Right 06/07/2017    Tarsal Tunnel release; FASCIOTOMY PLANTAR    HX SHOULDER SURGERY Right 06/2016    Exc. Bone Spur, Repair Rotator Cuff Tear    HX TUBAL LIGATION  1992    KNEE SURGERY Left       Allergies:   No Known Allergies  Medications:     Current Outpatient Medications   Medication Sig    amLODIPine (NORVASC) 10 mg Oral Tablet Take 1 Tablet (10 mg total) by mouth Once a day AMLODIPINE BESYLATE    aspirin 81 mg Oral Tablet, Chewable Chew 1 Tablet (81 mg total) Once a day    lisinopriL-hydrochlorothiazide (ZESTORETIC) 20-12.5 mg Oral Tablet Take 1 Tablet by mouth Twice daily    semaglutide (OZEMPIC) 0.25 mg or 0.5 mg (2 mg/3 mL) Subcutaneous Pen Injector Inject 0.5  mg under the skin Every 7 days    simvastatin (ZOCOR) 20 mg Oral Tablet Take 1 Tablet (20 mg total) by mouth Every evening     Family History:     Family Medical History:       Problem Relation (Age of Onset)    Alzheimer's/Dementia Mother, Father    Arthritis-osteo Mother, Father    Blood Clots Father    Breast Cancer Maternal Aunt, Maternal Aunt    Congestive Heart Failure Father    Coronary Artery Disease Father    Diabetes Father    Heart Attack Mother, Father    High Cholesterol Mother, Father    Hypertension (High Blood Pressure) Mother, Father    Migraines Mother, Father    Sleep disorders Father    Stroke Mother, Father            Social History:     Social History     Tobacco Use    Smoking status: Never    Smokeless tobacco: Never   Vaping Use    Vaping status: Never Used   Substance Use Topics    Alcohol use: No    Drug use: No      Review of Systems:   All pertinent Review of Systems as addressed in HPI above  Physical Exam:   Vital Signs: BP 127/79   Pulse 68   Temp 36.8 C (98.2 F) (Thermal Scan)   Resp 16   Ht 1.575 m (5\' 2" )    Wt 89.8 kg (198 lb)   LMP  (LMP Unknown)   SpO2 98%   BMI 36.21 kg/m       General:AGE@ female appears in good health   Eyes: Conjunctiva clear.  HENT: ENMT without erythema or injection, mucous membranes moist.  Neck: supple, symmetrical, trachea midline  Lungs: Clear to auscultation bilaterally.  There are no wheezes, or rhonchi.  Cardiovascular: regular rate and rhythm  Abdomen: Soft, non-tender  Extremities: No cyanosis or edema  Skin: Skin warm and dry  Neurologic: Grossly normal, Alert and oriented x3  Lymphatics: No lymphadenopathy  Psychiatric: Normal mood and behavior    Data Reviewed:   Lab Results:  CBC  Diff   Lab Results   Component Value Date/Time    WBC 10.0 08/30/2022 09:21 AM    HGB 14.9 08/30/2022 09:21 AM    HCT 41.4 08/30/2022 09:21 AM    PLTCNT 182 08/30/2022 09:21 AM    RBC 4.48 08/30/2022 09:21 AM    MCV 92.4 08/30/2022 09:21 AM    MCHC 36.0 (H) 08/30/2022 09:21 AM    MCH 33.3 (H) 08/30/2022 09:21 AM    RDW 12.4 05/07/2020 07:13 AM    MPV 11.6 08/30/2022 09:21 AM    Lab Results   Component Value Date/Time    PMNS 70 08/30/2022 09:21 AM    LYMPHOCYTES 7 (L) 05/07/2020 07:13 AM    EOSINOPHIL 0 05/07/2020 07:13 AM    MONOCYTES 7 08/30/2022 09:21 AM    BASOPHILS 1 08/30/2022 09:21 AM    BASOPHILS <0.10 08/30/2022 09:21 AM    PMNABS 7.09 08/30/2022 09:21 AM    LYMPHSABS 1.85 08/30/2022 09:21 AM    EOSABS 0.21 08/30/2022 09:21 AM    MONOSABS 0.71 08/30/2022 09:21 AM          COMPREHENSIVE METABOLIC PANEL   Lab Results   Component Value Date    SODIUM 141 09/21/2023    POTASSIUM 4.0 09/21/2023    CHLORIDE 109 09/21/2023  CO2 24 09/21/2023    ANIONGAP 8 09/21/2023    BUN 12 09/21/2023    CREATININE 0.84 09/21/2023    GLUCOSENF 126 (H) 03/06/2020    CALCIUM 9.2 09/21/2023    ALBUMIN 3.9 09/21/2023    TOTALPROTEIN 7.4 09/21/2023    ALKPHOS 80 09/21/2023    AST 18 09/21/2023    ALT 27 (H) 09/21/2023           THYROID STIMULATING HORMONE  Lab Results   Component Value Date    TSH 2.118 08/30/2022            Assessment & Plan:       ICD-10-CM    1. Obesity, unspecified class, unspecified obesity type, unspecified whether serious comorbidity present  E66.9       2. OSA (obstructive sleep apnea)  G47.33       3. Essential hypertension  I10          I will proceed in a simple way.  I will start nasal pillows and see if patient is able to tolerate this.  On her next appointment, patient will bring her CPAP machine so we can determine if some adjustments are necessary.    Follow up:  2 months    On the day of the encounter, a total of  30 minutes was spent on this patient encounter including review of historical information, examination, documentation and post-visit activities. The time documented excludes procedural time.     Flint Melter, MD     Portions of this note may be dictated using voice recognition software or a dictation service. Variances in spelling and vocabulary are possible and unintentional. Not all errors are caught/corrected. Please notify the [facility] Parkin if any discrepancies are noted or if the meaning of any statement is not clear.

## 2023-11-15 ENCOUNTER — Telehealth (HOSPITAL_BASED_OUTPATIENT_CLINIC_OR_DEPARTMENT_OTHER): Payer: Self-pay | Admitting: CRITICAL CARE MEDICINE

## 2023-11-15 NOTE — Telephone Encounter (Signed)
Patient is still waiting on the nasal mask that Dr. Kristine Royal was suppose to order for her. She would like to know how much longer before she will receive this.    Thank you,  Toni Tran  11/15/2023 14:09

## 2023-11-17 ENCOUNTER — Other Ambulatory Visit: Payer: Self-pay

## 2023-11-17 ENCOUNTER — Ambulatory Visit
Admission: RE | Admit: 2023-11-17 | Discharge: 2023-11-17 | Disposition: A | Discharge status: Home / Self Care | Payer: Medicare Other | Source: Ambulatory Visit

## 2023-11-17 ENCOUNTER — Encounter (HOSPITAL_BASED_OUTPATIENT_CLINIC_OR_DEPARTMENT_OTHER): Payer: Self-pay

## 2023-11-17 DIAGNOSIS — Z1231 Encounter for screening mammogram for malignant neoplasm of breast: Secondary | ICD-10-CM | POA: Insufficient documentation

## 2023-11-21 ENCOUNTER — Other Ambulatory Visit (HOSPITAL_COMMUNITY): Payer: Self-pay

## 2023-11-21 DIAGNOSIS — E119 Type 2 diabetes mellitus without complications: Secondary | ICD-10-CM

## 2023-11-21 MED ORDER — SEMAGLUTIDE 0.25 MG OR 0.5 MG (2 MG/3 ML) SUBCUTANEOUS PEN INJECTOR
0.5000 mg | PEN_INJECTOR | SUBCUTANEOUS | 1 refills | Status: DC
Start: 2023-11-21 — End: 2023-12-26

## 2023-11-21 NOTE — Telephone Encounter (Signed)
LOV 10/20/23  NOV 01/05/24    Drake Leach, Ambulatory Prescription Coordinator   [facility]  11/21/2023, 12:05

## 2023-12-18 ENCOUNTER — Encounter (HOSPITAL_COMMUNITY): Payer: Self-pay

## 2023-12-19 ENCOUNTER — Ambulatory Visit (HOSPITAL_COMMUNITY): Payer: Self-pay | Admitting: CRITICAL CARE MEDICINE

## 2023-12-23 ENCOUNTER — Other Ambulatory Visit: Payer: Self-pay

## 2023-12-23 ENCOUNTER — Ambulatory Visit: Payer: Medicare Other | Attending: VASCULAR SURGERY | Admitting: VASCULAR SURGERY

## 2023-12-23 ENCOUNTER — Encounter (HOSPITAL_COMMUNITY): Payer: Self-pay | Admitting: VASCULAR SURGERY

## 2023-12-23 VITALS — BP 124/76 | HR 88 | Ht 62.0 in | Wt 192.9 lb

## 2023-12-23 DIAGNOSIS — Z823 Family history of stroke: Secondary | ICD-10-CM

## 2023-12-23 DIAGNOSIS — Z8249 Family history of ischemic heart disease and other diseases of the circulatory system: Secondary | ICD-10-CM

## 2023-12-23 DIAGNOSIS — R233 Spontaneous ecchymoses: Secondary | ICD-10-CM

## 2023-12-23 DIAGNOSIS — M79604 Pain in right leg: Secondary | ICD-10-CM | POA: Insufficient documentation

## 2023-12-23 NOTE — H&P (Signed)
Department of Vascular Surgery  [facility] History and Physical      Date: 12/23/2023  Patient: Toni Tran  DOB: 13-Dec-1956  PCP: Vilinda Boehringer, PA-C    Chief Complaint:   Chief Complaint   Patient presents with    New Patient     Petechiae       Subjective:    HPI: Toni Tran is a 68 y.o. White female who presents for evaluation of right leg pain and possible bruising.  This started about 6 months ago when patient had a fall in the right lower extremity.  This has progressively got worse that a point that patient could not walk on her right lower extremity without having severe pain.  She was referred to vascular surgery to exclude any vascular problems.  Patient does not have smoking history.    PMH:  Past Medical History:   Diagnosis Date    Anxiety     Blood 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, right sided weakness    Depression     History of anesthesia complications     Required overnight stay after tarsal tunnel release d/t hypoxia per pt,     Hypercholesterolemia     Hyperlipidemia     Hypertension     MRSA infection     Obesity     Peripheral edema     right foot    Peripheral neuropathy     Right foot s/p tarsal tunnel release    Plantar fascial fibromatosis of right foot     PONV (postoperative nausea and vomiting)     requests Anti Emetic    RLS (restless legs syndrome)     Snores     Never has had sleep study    Tarsal tunnel syndrome, right     Vitamin D deficiency     Wears glasses            Family Hx:  Family Medical History:       Problem Relation (Age of Onset)    Alzheimer's/Dementia Mother, Father    Arthritis-osteo Mother, Father    Blood Clots Father    Breast Cancer Maternal Aunt, Maternal Aunt    Congestive Heart Failure Father    Coronary Artery Disease Father    Diabetes Father    Heart Attack Mother,  Father    High Cholesterol Mother, Father    Hypertension (High Blood Pressure) Mother, Father    Migraines Mother, Father    Sleep disorders Father    Stroke Mother, Father              Medications:  Current Outpatient Medications   Medication Sig Dispense Refill    amLODIPine (NORVASC) 10 mg Oral Tablet Take 1 Tablet (10 mg total) by mouth Once a day AMLODIPINE BESYLATE 90 Tablet 1    lisinopriL-hydrochlorothiazide (ZESTORETIC) 20-12.5 mg Oral Tablet Take 1 Tablet by mouth Twice daily 180 Tablet 1    semaglutide (OZEMPIC) 0.25 mg or 0.5 mg (2 mg/3 mL) Subcutaneous Pen Injector Inject 0.5 mg under the skin Every 7 days 3 mL 1    simvastatin (ZOCOR) 20 mg Oral Tablet Take 1 Tablet (20 mg total) by mouth Every evening 90 Tablet 1     No current  facility-administered medications for this visit.       Allergies:  No Known Allergies    Past Surgical Hx:  Past Surgical History:   Procedure Laterality Date    HX APPENDECTOMY  childhood    HX EYE SURGERY Bilateral Childhood    Muscle Surgery    HX FOOT SURGERY Right 06/07/2017    Tarsal Tunnel release; FASCIOTOMY PLANTAR    HX SHOULDER SURGERY Right 06/2016    Exc. Bone Spur, Repair Rotator Cuff Tear    HX TUBAL LIGATION  1992    KNEE SURGERY Left            Social Hx:  Social History     Socioeconomic History    Marital status: Single     Spouse name: Not on file    Number of children: Not on file    Years of education: Not on file    Highest education level: Not on file   Occupational History    Not on file   Tobacco Use    Smoking status: Never    Smokeless tobacco: Never   Vaping Use    Vaping status: Never Used   Substance and Sexual Activity    Alcohol use: No    Drug use: No    Sexual activity: Not on file   Other Topics Concern    Ability to Walk 1 Flight of Steps without SOB/CP Not Asked    Routine Exercise Not Asked    Ability to Walk 2 Flight of Steps without SOB/CP Yes    Unable to Ambulate Not Asked    Total Care Not Asked    Ability To Do Own ADL's Yes     Uses Walker Not Asked    Other Activity Level Not Asked    Uses Cane Not Asked   Social History Narrative    Not on file     Social Determinants of Health     Financial Resource Strain: Not on file   Transportation Needs: Not on file   Social Connections: Not on file   Intimate Partner Violence: Not on file   Housing Stability: Not on file       Review Of Systems:  As per HPI, all other systems reviewed and are negative    Objective:  Physical Exam:    Constitutional:   BP 124/76 (Site: Left Arm, Patient Position: Sitting, Cuff Size: Adult)   Pulse 88   Ht 1.575 m (5\' 2" )   Wt 87.5 kg (192 lb 14.4 oz)   LMP  (LMP Unknown)   SpO2 96%   BMI 35.28 kg/m       Appears in good health and no distress    Cardiovascular:       Right femoral artery: 2+ (normal), Left femoral artery: 2+ (normal)  Right dorsalis pedis artery:  2+ (normal), Left dorsalis pedis artery:  2+ (normal),    Right posterior tibial artery: 2+ (normal) Left posterior tibeal artery:  2+ (normal)   No extremity edema or varicosities          Lab results:  I reviewed patient's BMP from 09/21/2023 that demonstrated decreased GFR at 76 and hyperglycemia with glucose of 148    I reviewed patient's liver panel from 09/21/2023 that demonstrated mild elevation of ALT    Imaging results:  I reviewed patient's carotid artery duplex from 09/23/2022 that demonstrated mild bilateral carotid artery stenosis.    Assessment/Plan:  Right lower extremity pain - this is a  new undiagnosed problem with uncertain prognosis.  Patient has palpable pulses in her bilateral lower extremities and does not have any significant arterial disease.  She does not have any significant swelling or veins that could be attributable to chronic venous insufficiency.  Her symptoms are more likely to be caused by musculoskeletal pathology especially that she had history of fall on the right leg.  I will refer the patient to orthopedic surgery for further evaluation treatment.  Patient can  see me on a p.r.n. basis.    Patient was given the opportunity to ask questions and those questions were answered to their satisfaction. Instructed to call with any further questions or concerns.      Myrlene Broker, MD

## 2023-12-26 ENCOUNTER — Other Ambulatory Visit (INDEPENDENT_AMBULATORY_CARE_PROVIDER_SITE_OTHER): Payer: Self-pay | Admitting: FAMILY MEDICINE

## 2023-12-26 DIAGNOSIS — E119 Type 2 diabetes mellitus without complications: Secondary | ICD-10-CM

## 2023-12-27 MED ORDER — SEMAGLUTIDE 0.25 MG OR 0.5 MG (2 MG/3 ML) SUBCUTANEOUS PEN INJECTOR
0.5000 mg | PEN_INJECTOR | SUBCUTANEOUS | 1 refills | Status: DC
Start: 2023-12-27 — End: 2024-01-18

## 2023-12-29 ENCOUNTER — Other Ambulatory Visit: Payer: Self-pay

## 2023-12-29 ENCOUNTER — Ambulatory Visit: Payer: Medicare Other

## 2023-12-29 ENCOUNTER — Ambulatory Visit (HOSPITAL_BASED_OUTPATIENT_CLINIC_OR_DEPARTMENT_OTHER): Payer: Medicare Other

## 2023-12-29 DIAGNOSIS — R233 Spontaneous ecchymoses: Secondary | ICD-10-CM | POA: Insufficient documentation

## 2023-12-29 DIAGNOSIS — M79604 Pain in right leg: Secondary | ICD-10-CM | POA: Insufficient documentation

## 2023-12-29 DIAGNOSIS — E559 Vitamin D deficiency, unspecified: Secondary | ICD-10-CM | POA: Insufficient documentation

## 2023-12-29 LAB — VITAMIN D 25 TOTAL: VITAMIN D 25, TOTAL: 10.2 ng/mL — ABNORMAL LOW (ref 20.0–100.0)

## 2023-12-29 LAB — C-REACTIVE PROTEIN(CRP),INFLAMMATION: CRP INFLAMMATION: 2.2 mg/L (ref <8.0)

## 2023-12-29 LAB — SEDIMENTATION RATE: ERYTHROCYTE SEDIMENTATION RATE (ESR): 9 mm/h (ref 0–30)

## 2023-12-30 LAB — RHEUMATOID FACTOR, SERUM: RHEUMATOID FACTOR: <13 [IU]/mL (ref <=30)

## 2024-01-02 LAB — HEP-2 SUBSTRATE ANTINUCLEAR ANTIBODIES (ANA), SERUM: ANA INTERPRETATION: NEGATIVE

## 2024-01-05 ENCOUNTER — Ambulatory Visit: Payer: Medicare Other

## 2024-01-05 ENCOUNTER — Other Ambulatory Visit: Payer: Self-pay

## 2024-01-05 ENCOUNTER — Encounter (INDEPENDENT_AMBULATORY_CARE_PROVIDER_SITE_OTHER): Payer: Self-pay

## 2024-01-05 VITALS — BP 121/82 | HR 88 | Temp 97.4°F | Ht 62.0 in | Wt 191.6 lb

## 2024-01-05 DIAGNOSIS — Z130 Encounter for screening for diseases of the blood and blood-forming organs and certain disorders involving the immune mechanism: Secondary | ICD-10-CM

## 2024-01-05 DIAGNOSIS — F32A Depression, unspecified: Secondary | ICD-10-CM

## 2024-01-05 DIAGNOSIS — Z7985 Long-term (current) use of injectable non-insulin antidiabetic drugs: Secondary | ICD-10-CM

## 2024-01-05 DIAGNOSIS — E785 Hyperlipidemia, unspecified: Secondary | ICD-10-CM

## 2024-01-05 DIAGNOSIS — I10 Essential (primary) hypertension: Secondary | ICD-10-CM

## 2024-01-05 DIAGNOSIS — G4700 Insomnia, unspecified: Secondary | ICD-10-CM

## 2024-01-05 DIAGNOSIS — E559 Vitamin D deficiency, unspecified: Secondary | ICD-10-CM

## 2024-01-05 DIAGNOSIS — E1165 Type 2 diabetes mellitus with hyperglycemia: Secondary | ICD-10-CM

## 2024-01-05 DIAGNOSIS — Z6835 Body mass index (BMI) 35.0-35.9, adult: Secondary | ICD-10-CM

## 2024-01-05 DIAGNOSIS — Z0000 Encounter for general adult medical examination without abnormal findings: Secondary | ICD-10-CM

## 2024-01-05 DIAGNOSIS — G4733 Obstructive sleep apnea (adult) (pediatric): Secondary | ICD-10-CM

## 2024-01-05 DIAGNOSIS — Z8673 Personal history of transient ischemic attack (TIA), and cerebral infarction without residual deficits: Secondary | ICD-10-CM

## 2024-01-05 DIAGNOSIS — E669 Obesity, unspecified: Secondary | ICD-10-CM

## 2024-01-05 DIAGNOSIS — F419 Anxiety disorder, unspecified: Secondary | ICD-10-CM

## 2024-01-05 LAB — POCT EAST HGB A1C (AMB): POCT HGB A1C: 5.2 % (ref 4–6)

## 2024-01-05 MED ORDER — ERGOCALCIFEROL (VITAMIN D2) 1,250 MCG (50,000 UNIT) CAPSULE
50000.0000 [IU] | ORAL_CAPSULE | ORAL | 0 refills | Status: DC
Start: 2024-01-05 — End: 2024-04-05

## 2024-01-05 MED ORDER — TRAZODONE 50 MG TABLET
50.0000 mg | ORAL_TABLET | Freq: Every evening | ORAL | 0 refills | Status: DC
Start: 2024-01-05 — End: 2024-04-05

## 2024-01-05 NOTE — Nursing Note (Signed)
Chief Complaint:   Chief Complaint              Follow Up 3 Months     Medicare Annual           Functional Health Screen  Review Flowsheet  More data exists         10/20/2023   FUNCTIONAL HEALTH SCREENING   How often do you have a problem understanding what is told to you about your medical condition?  Never   Because we are aware of abuse and domestic violence today, we ask all patients: Are you being hurt, hit, or frightened by anyone at your home or in your life?  N   Do you have any basic needs within your home that are not being met? (such as Food, Shelter, Civil Service fast streamer, Tranportation, paying for bills and/or medications) N   Do you have any advanced directives? No Advance   Would you like an advanced directive packet? Refused Packet      Details                 BP 121/82   Pulse 88   Temp 36.3 C (97.4 F) (Thermal Scan)   Ht 1.575 m (5\' 2" )   Wt 86.9 kg (191 lb 9.6 oz)   LMP  (LMP Unknown)   SpO2 99%   BMI 35.04 kg/m       Social History     Tobacco Use   Smoking Status Never   Smokeless Tobacco Never     Patient Health Rating           Depression Screening  PHQ Questionnaire  Little interest or pleasure in doing things.: Several Days  Feeling down, depressed, or hopeless: Several Days  PHQ 2 Total: 2  Allergies:  No Known Allergies  Medication History  Reviewed for OTC medication and any new medications, provider will review medication history  Results through Enter/Edit  No results found for this or any previous visit (from the past 24 hour(s)).  POCT Results  Care Team  Patient Care Team:  Vilinda Boehringer, PA-C as PCP - General (PHYSICIAN ASSISTANT)  Immunizations - last 24 hours       None          Legrand Rams, [place]  01/05/2024, 15:56

## 2024-01-05 NOTE — Progress Notes (Signed)
 [facility], [facility]  [address]  [place] [address]  Operated by [facility]  Medicare Annual Wellness Visit    Name: Toni Tran MRN:  [id number]   Date: 01/05/2024 Age: 68 y.o.       SUBJECTIVE:   Toni Tran is a 68 y.o. female for presenting for Medicare Wellness exam.   I have reviewed and reconciled the medication list with the patient today.    Comprehensive Health Assessment:  Patient verbal responses recorded in flowsheet        No data to display                I have reviewed and updated as appropriate the past medical, family and social history. 01/05/2024 as summarized below:  Past Medical History:   Diagnosis Date    Anxiety     Blood 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, right sided weakness    Depression     History of anesthesia complications     Required overnight stay after tarsal tunnel release d/t hypoxia per pt,     Hypercholesterolemia     Hyperlipidemia     Hypertension     MRSA infection     Obesity     Peripheral edema     right foot    Peripheral neuropathy     Right foot s/p tarsal tunnel release    Plantar fascial fibromatosis of right foot     PONV (postoperative nausea and vomiting)     requests Anti Emetic    RLS (restless legs syndrome)     Snores     Never has had sleep study    Tarsal tunnel syndrome, right     Vitamin D deficiency     Wears glasses      Past Surgical History:   Procedure Laterality Date    Hx appendectomy  childhood    Hx eye surgery Bilateral Childhood    Hx foot surgery Right 06/07/2017    Hx shoulder surgery Right 06/2016    Hx tubal ligation  1992    Knee surgery Left      Current Outpatient Medications   Medication Sig    amLODIPine (NORVASC) 10 mg Oral Tablet Take 1 Tablet (10 mg total) by mouth Once a day AMLODIPINE BESYLATE    ergocalciferol, vitamin  D2, (DRISDOL) 1,250 mcg (50,000 unit) Oral Capsule Take 1 Capsule (50,000 Units total) by mouth Every 7 days for 90 days    lisinopriL-hydrochlorothiazide (ZESTORETIC) 20-12.5 mg Oral Tablet Take 1 Tablet by mouth Twice daily    semaglutide (OZEMPIC) 0.25 mg or 0.5 mg (2 mg/3 mL) Subcutaneous Pen Injector Inject 0.5 mg under the skin Every 7 days    simvastatin (ZOCOR) 20 mg Oral Tablet Take 1 Tablet (20 mg total) by mouth Every evening    traZODone (DESYREL) 50 mg Oral Tablet Take 1 Tablet (50 mg total) by mouth Every night for 90 days     Family Medical History:       Problem Relation (Age of Onset)    Alzheimer's/Dementia Mother, Father    Arthritis-osteo Mother, Father    Blood Clots Father    Breast Cancer Maternal Aunt, Maternal Aunt    Congestive Heart Failure Father  Coronary Artery Disease Father    Diabetes Father    Heart Attack Mother, Father    High Cholesterol Mother, Father    Hypertension (High Blood Pressure) Mother, Father    Migraines Mother, Father    Sleep disorders Father    Stroke Mother, Father            Social History     Socioeconomic History    Marital status: Single   Tobacco Use    Smoking status: Never    Smokeless tobacco: Never   Vaping Use    Vaping status: Never Used   Substance and Sexual Activity    Alcohol use: No    Drug use: No     Social Determinants of Health     Health Literacy: Low Risk  (10/20/2023)    Health Literacy     SDOH Health Literacy: Never         List of Current Health Care Providers   Care Team       PCP       Name Type Specialty Phone Number    Vilinda Boehringer, [place] Physician Assistant PHYSICIAN ASSISTANT [phone]              Care Team       No care team found                      Health Maintenance   Topic Date Due    Hepatitis C screening  Never done    Shingles Vaccine (1 of 2) Never done    RSV Adult 60+ or Pregnancy (1 - Risk 60-74 years 1-dose series) Never done    Covid-19 Vaccine (4 - 2024-25 season) 08/14/2023    Pneumococcal Vaccination, Age 52+  (2 of 2 - PPSV23) 02/24/2024    Diabetic A1C  07/04/2024    Diabetic Kidney Health eGFR  09/20/2024    Diabetic Kidney Health Microalb/Cr Ratio  09/20/2024    Breast Cancer Screening  11/16/2025    Diabetic Retinal Exam  01/16/2026    Fecal DNA Test (Cologuard)  03/01/2026    Adult Tdap-Td (2 - Td or Tdap) 09/16/2031    Osteoporosis screening  04/03/2033    Influenza Vaccine  Completed    Medicare Annual Wellness Visit - Calendar Year Insurers  Completed     Medicare Wellness Assessment   Medicare initial or wellness physical in the last year?: Yes  Advance Directives   Does patient have a living will or MPOA: No           Advance directive information given to the patient today?: Patient Declined      Activities of Daily Living   Do you need help with dressing, bathing, or walking?: No   Do you need help with shopping, housekeeping, medications, or finances?: No   Do you have rugs in hallways, broken steps, or poor lighting?: No   Do you have grab bars in your bathroom, non-slip strips in your tub, and hand rails on your stairs?: No   Cognitive Function Screen (1=Yes, 0=No)   What is you age?: Correct   What is the time to the nearest hour?: Correct   What is the year?: Correct   What is the name of this clinic?: Correct   Can the patient recognize two persons (the doctor, the nurse, home help, etc.)?: Correct   What is the date of your birth? (day and month sufficient) : Correct   In what  year did World War II end?: Incorrect   Who is the current president of the [place]?: Correct   Count from 20 down to 1?: Correct   What address did I give you earlier?: Incorrect   Total Score: 8       Fall Risk Screen   Do you feel unsteady when standing or walking?: Yes  Do you worry about falling?: Yes  Have you fallen in the past year?: Yes  How many times have you fallen?: Once  Were you ever injured from falling?: No   Depression Screen     Little interest or pleasure in doing things.: Several Days  Feeling down,  depressed, or hopeless: Several Days  PHQ 2 Total: 2     Pain Score   Pain Score:   7    Substance Use-Abuse Screening     Tobacco Use     In Past 12 MONTHS, how often have you used any tobacco product (for example, cigarettes, e-cigarettes, cigars, pipes, or smokeless tobacco)?: Never     Alcohol use     In the PAST 12 MONTHS, how often have you had 5 (men)/4 (women) or more drinks containing alcohol in one day?: Never     Prescription Drug Use     In the PAST 12 months, how often have you used any prescription medications just for the feeling, more than prescribed, or that were not prescribed for you? Prescriptions may include: opioids, benzodiazepines, medications for ADHD: Never           Illicit Drug Use   In the PAST 12 MONTHS, how often have you used any drugs, including marijuana, cocaine or crack, heroin, methamphetamine, hallucinogens, ecstasy/MDMA?: Never                                  OBJECTIVE:   BP 121/82   Pulse 88   Temp 36.3 C (97.4 F) (Thermal Scan)   Ht 1.575 m (5\' 2" )   Wt 86.9 kg (191 lb 9.6 oz)   LMP  (LMP Unknown)   SpO2 99%   BMI 35.04 kg/m        Other appropriate exam:    Health Maintenance Due   Topic Date Due    Hepatitis C screening  Never done    Shingles Vaccine (1 of 2) Never done    RSV Adult 60+ or Pregnancy (1 - Risk 60-74 years 1-dose series) Never done    Covid-19 Vaccine (4 - 2024-25 season) 08/14/2023      ASSESSMENT & PLAN:   Assessment/Plan   1. Medicare annual wellness visit, subsequent    2. Type 2 diabetes mellitus with hyperglycemia (CMS HCC)    3. Hyperlipidemia, unspecified hyperlipidemia type    4. Hypertension, unspecified type    5. OSA (obstructive sleep apnea)    6. History of CVA (cerebrovascular accident)    7. Vitamin D deficiency    8. Depression, unspecified depression type    9. Anxiety    10. Insomnia, unspecified type    11. Obesity, unspecified class, unspecified obesity type, unspecified whether serious comorbidity present    12. Screening for  deficiency anemia       Identified Risk Factors/ Recommended Actions     Fall Risk Follow up plan of care: Discussed optimizing home safety more so related to orthopedic issues. Will be following with Ortho    Patient declined Advanced Directives  information.    Orders Placed This Encounter    VITAMIN D 25 TOTAL    COMPREHENSIVE METABOLIC PNL, FASTING    LIPID PANEL    CBC W/AUTO DIFF    POCT EAST HGB A1C (AMB)    ergocalciferol, vitamin D2, (DRISDOL) 1,250 mcg (50,000 unit) Oral Capsule    traZODone (DESYREL) 50 mg Oral Tablet          The patient has been educated about risk factors and recommended preventive care. Written Prevention Plan completed/ updated and given to patient (see After Visit Summary).      Vilinda Boehringer, PA-C

## 2024-01-05 NOTE — Progress Notes (Signed)
 FAMILY [facility]INE, [facility]  [address] [place]  [place] [address]  Operated by [facility]    Encounter Date: 01/05/2024      Patient ID:  Toni Tran   MRN: [id number]    DOB: May 02, 1956   Age: 68 y.o.      Subjective:     Chief Complaint              Follow Up 3 Months     Medicare Annual              HPI:  This is a 68 y.o. female here for medicare annual and chronic management. PMH of hyperlipidemia, hypertension, OSA, CVA, DM type II, vitamin d deficiency, depression, anxiety, and obesity.     BP is well controlled in the office and at home. States readings are typically around 120 systolic. Endorses issues with insomnia. Not currently wearing CPAP - awaiting her machine. Denies chest pain, shortness of breath, nausea, vomiting, or diarrhea.    Labs reviewed from 12/29/23: Vitamin D 10.2. ESR, RF, ANA, CRP Negative/normal.     Current Outpatient Medications   Medication Sig    amLODIPine (NORVASC) 10 mg Oral Tablet Take 1 Tablet (10 mg total) by mouth Once a day AMLODIPINE BESYLATE    ergocalciferol, vitamin D2, (DRISDOL) 1,250 mcg (50,000 unit) Oral Capsule Take 1 Capsule (50,000 Units total) by mouth Every 7 days for 90 days    lisinopriL-hydrochlorothiazide (ZESTORETIC) 20-12.5 mg Oral Tablet Take 1 Tablet by mouth Twice daily    semaglutide (OZEMPIC) 0.25 mg or 0.5 mg (2 mg/3 mL) Subcutaneous Pen Injector Inject 0.5 mg under the skin Every 7 days    simvastatin (ZOCOR) 20 mg Oral Tablet Take 1 Tablet (20 mg total) by mouth Every evening    traZODone (DESYREL) 50 mg Oral Tablet Take 1 Tablet (50 mg total) by mouth Every night for 90 days       No Known Allergies   Past Medical History:   Diagnosis Date    Anxiety     Blood 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, right sided weakness    Depression     History of  anesthesia complications     Required overnight stay after tarsal tunnel release d/t hypoxia per pt,     Hypercholesterolemia     Hyperlipidemia     Hypertension     MRSA infection     Obesity     Peripheral edema     right foot    Peripheral neuropathy     Right foot s/p tarsal tunnel release    Plantar fascial fibromatosis of right foot     PONV (postoperative nausea and vomiting)     requests Anti Emetic    RLS (restless legs syndrome)     Snores     Never has had sleep study    Tarsal tunnel syndrome, right     Vitamin D deficiency     Wears glasses       Past Surgical History:   Procedure Laterality Date    HX APPENDECTOMY  childhood    HX EYE SURGERY Bilateral Childhood    Muscle Surgery    HX FOOT SURGERY Right 06/07/2017    Tarsal Tunnel release; FASCIOTOMY PLANTAR  HX SHOULDER SURGERY Right 06/2016    Exc. Bone Spur, Repair Rotator Cuff Tear    HX TUBAL LIGATION  1992    KNEE SURGERY Left       Family Medical History:       Problem Relation (Age of Onset)    Alzheimer's/Dementia Mother, Father    Arthritis-osteo Mother, Father    Blood Clots Father    Breast Cancer Maternal Aunt, Maternal Aunt    Congestive Heart Failure Father    Coronary Artery Disease Father    Diabetes Father    Heart Attack Mother, Father    High Cholesterol Mother, Father    Hypertension (High Blood Pressure) Mother, Father    Migraines Mother, Father    Sleep disorders Father    Stroke Mother, Father            Social History     Socioeconomic History    Marital status: Single   Tobacco Use    Smoking status: Never    Smokeless tobacco: Never   Vaping Use    Vaping status: Never Used   Substance and Sexual Activity    Alcohol use: No    Drug use: No     Social Determinants of Health     Health Literacy: Low Risk  (10/20/2023)    Health Literacy     SDOH Health Literacy: Never      ROS   All pertinent review of systems both positive and negative as per HPI.      Objective:   Vitals: BP 121/82   Pulse 88   Temp 36.3 C (97.4 F)  (Thermal Scan)   Ht 1.575 m (5\' 2" )   Wt 86.9 kg (191 lb 9.6 oz)   LMP  (LMP Unknown)   SpO2 99%   BMI 35.04 kg/m         Physical Exam  Vitals and nursing note reviewed.   Constitutional:       Appearance: She is well-developed.   HENT:      Head: Normocephalic and atraumatic.   Eyes:      Conjunctiva/sclera: Conjunctivae normal.   Neck:      Vascular: No JVD.   Cardiovascular:      Rate and Rhythm: Normal rate and regular rhythm.      Heart sounds: No murmur heard.     No friction rub. No gallop.   Pulmonary:      Effort: Pulmonary effort is normal. No respiratory distress.      Breath sounds: No stridor. No wheezing, rhonchi or rales.   Abdominal:      General: There is no distension.      Palpations: Abdomen is soft.   Musculoskeletal:      Cervical back: Normal range of motion.   Skin:     General: Skin is warm and dry.   Neurological:      Mental Status: She is alert and oriented to person, place, and time.   Psychiatric:         Mood and Affect: Mood normal.         Behavior: Behavior normal.         Thought Content: Thought content normal.         Judgment: Judgment normal.            Assessment & Plan:     ENCOUNTER DIAGNOSES     ICD-10-CM   1. Medicare annual wellness visit, subsequent  Z00.00   2. Hypertension,  unspecified type  I10   3. Hyperlipidemia, unspecified hyperlipidemia type  E78.5   4. OSA (obstructive sleep apnea)  G47.33   5. History of CVA (cerebrovascular accident)  Z86.73   6. Type 2 diabetes mellitus with hyperglycemia (CMS HCC)  E11.65   7. Vitamin D deficiency  E55.9   8. Depression, unspecified depression type  F32.A   9. Anxiety  F41.9   10. Insomnia, unspecified type  G47.00   11. Obesity, unspecified class, unspecified obesity type, unspecified whether serious comorbidity present  E66.9   12. Screening for deficiency anemia  Z13.0      Orders Placed This Encounter    VITAMIN D 25 TOTAL    COMPREHENSIVE METABOLIC PNL, FASTING    LIPID PANEL    CBC W/AUTO DIFF    POCT EAST HGB  A1C (AMB)    ergocalciferol, vitamin D2, (DRISDOL) 1,250 mcg (50,000 unit) Oral Capsule    traZODone (DESYREL) 50 mg Oral Tablet      This is a 68 y.o. female here with/for    Medicare annual/age appropriate screening   -See separate note  -CMP  -Lipid panel  -CBC     Diabetes type II/hyperlipidemia   -A1C 5.2 in the office today  -Continue Ozempic 0.5 mg subcutaneous once q.7 days   -Continue Zocor 20 mg p.o. q.pm   -Previously referred for DM eye exam  -CMP  -Lipid panel      Hypertension, controlled  -Continue Norvasc 10 mg p.o. q.d   -Continue Zestoretic 20-12.5 mg p.o. b.i.d  -CMP     OSA  -Not currently wearing CPAP  -Previously referred to pulmonology      Hx of CVA  -Emphasize importance of control of HTN and DM  -Continue Aspirin 81 mg p.o. q.d   -Continue Norvasc 10 mg p.o. q.d   -Continue Zestoretic 20-12.5 mg p.o. b.i.d  -Continue Zocor 20 mg p.o q.pm      Vitamin D deficiency  -Start high dose vitamin D 50,000 units p.o. once weekly   -Repeat vitamin D level in 3 months      Depression/anxiety/insomnia   -Start Trazodone 50 mg p.o. q.pm      Obesity  -Lifestyle modifications/Ozempic as above      Follow-up in 3 months for chronic management/A1C/vitamin D    Vilinda Boehringer, PA-C

## 2024-01-05 NOTE — Patient Instructions (Signed)
Medicare Preventive Services  Medicare coverage information Recommendation for YOU   Heart Disease and Diabetes   Lipid profile Every 5 years or more often if at risk for cardiovascular disease     Lab Results   Component Value Date    CHOLESTEROL 172 06/10/2023    HDLCHOL 49 (L) 06/10/2023    LDLCHOL 96 06/10/2023    TRIG 153 (H) 06/10/2023         Diabetes Screening    Yearly for those at risk for diabetes, 2 tests per year for those with prediabetes Last Glucose:      Diabetes Self Management Training or Medical Nutrition Therapy  For those with diabetes, up to 10 hrs initial training within a year, subsequent years up to 2 hrs of follow up training Optional for those with diabetes     Medical Nutrition Therapy  Three hours of one-on-one counseling in first year, two hours in subsequent years Optional for those with diabetes, kidney disease   Intensive Behavioral Therapy for Obesity  Face-to-face counseling, first month every week, month 2-6 every other week, month 7-12 every month if continued progress is documented Optional for those with Body Mass Index 30 or higher  Your Body mass index is 35.04 kg/m.   Tobacco Cessation (Quitting) Counseling   Covers up to 8 smoking and tobacco-use cessation counseling sessions in a 41-month period.    Optional for those that use tobacco   Cancer Screening Last Completion Date   Colorectal screening   For anyone age 69 to 71 or any age if high risk:  Screening Colonoscopy every 10 yrs if low risk,  more frequent if higher risk  OR  Cologuard Stool DNA test once every 3 years OR  Fecal Occult Blood Testing yearly OR  Flexible  Sigmoidoscopy  every 5 yr OR  CT Colonography every 5 yrs    --03/02/2023  See below for due date if applicable.   Screening Pap Test   Recommended every 3 years for all women age 69 to 52, or every five years if combined with HPV test (routine screening not needed after total hysterectomy).  Medicare covers every 2 years or yearly if high  risk.  Screening Pelvic Exam   Medicare covers every 2 years, yearly if high risk or childbearing age with abnormal Pap in last 3 yrs.     See below for due date if applicable.   Screening Mammogram   Recommended every 2 years for women age 61 to 45, or more frequent if you have a higher risk. Selectively recommended for women between 40-49 based on shared decisions about risk. Covered by Medicare up to every year for women age 69 or older --11/17/2023  See below for due date if applicable.         Lung Cancer Screening  Annual low dose computed tomography (LDCT scan) is recommended for those age 19-80 who smoked 20 pack-years and are current smokers or quit smoking within past 15 years, after counseling by your doctor or nurse clinician about the possible benefits or harms.     See below for due date if applicable.   Vaccinations   Respiratory syncytial virus (RSV)  Age 35 years or older: Based on shared clinical decision-making with your provider.  Pneumococcal Vaccine  Recommended routinely age 66+ with one or two separate vaccines based on your risk. Recommended before age 66 if medical conditions with increased risk  Seasonal Influenza Vaccine  Once every flu season  Hepatitis B Vaccine  3 doses if risk (including anyone with diabetes or liver disease)  Shingles Vaccine  Two doses at age 40 or older  Diphtheria Tetanus Pertussis Vaccine  ONCE as adult, booster every 10 years     Immunization History   Administered Date(s) Administered    Covid-19 Vaccine,Pfizer-BioNTech,Purple Top,34yrs+ 06/23/2020, 07/14/2020, 01/15/2021    High-Dose Influenza Vaccine, 69+ 02/24/2023, 10/20/2023    Tetanus Toxoid/Diphtheria Toxoid/Acellular Pertussis Vaccine, Adsorbed 09/15/2021    VAXNEUVANCE 02/24/2023     Shingles vaccine and Diphtheria Tetanus Pertussis vaccines are available at pharmacies or local health department without a prescription.   Other Preventative Screening  Last Completion Date   Bone Densitometry    Screening: All females ages 34 and older every 10 years if initial screening normal. Postmenopausal women ages 44-64 need screening with one or more risk factor: previous fracture, parental hip fracture, current smoker, low body weight, excessive alcohol use, Rheumatoid Arthritis   For women with diagnosed Osteoporosis, follow up is recommended every 2 years or a frequency recommended by your provider.     --04/04/2023  See below for due date if applicable.     Glaucoma Screening   Yearly if in high risk group such as diabetes, family history, African American age 69+ or Hispanic American age 47+   See your eye care provider for screening.   Hepatitis C Screening   Recommended  for those born between ages 18-69 years.     See below for due date if applicable.     HIV Testing  Recommended routinely at least ONCE, covered every year for age 69 to 54 regardless of risk, and every year for age over 83 who ask for the test or higher risk. Yearly or up to 3 times in pregnancy         See below for due date if applicable.   Abdominal Aortic Aneurysm Screening Ultrasound   Once with a family history of abdominal aortic aneurysms OR a female between 56-75 and have smoked at least 100 cigarettes in your lifetime.         See below for due date if applicable.       Your Personalized Schedule for Preventive Tests     Health Maintenance: Pending and Last Completed         Date Due Completion Date    Diabetic Retinal Exam Never done ---    Hepatitis C screening Never done ---    Shingles Vaccine (1 of 2) Never done ---    RSV Adult 60+ or Pregnancy (1 - Risk 60-74 years 1-dose series) Never done ---    Covid-19 Vaccine (4 - 2024-25 season) 08/14/2023 01/15/2021    Medicare Annual Wellness Visit - Calendar Year Insurers 12/14/2023 02/24/2023    Pneumococcal Vaccination, Age 69+ (2 of 2 - PPSV23) 02/24/2024 02/24/2023    Diabetic A1C 03/21/2024 09/21/2023    Diabetic Kidney Health eGFR 09/20/2024 09/21/2023    Diabetic Kidney Health  Microalb/Cr Ratio 09/20/2024 09/21/2023    Breast Cancer Screening 11/16/2025 11/17/2023    Fecal DNA Test (Cologuard) 03/01/2026 03/02/2023    Adult Tdap-Td (2 - Td or Tdap) 09/16/2031 09/15/2021    Osteoporosis screening 04/03/2033 04/04/2023                  For Information on Advanced Directives for Health Care:  [place]:  [email or web address]  [place], [place], [place], [place] General Information: [email or web address]

## 2024-01-05 NOTE — Nursing Note (Signed)
01/05/24 1500   Medicare Wellness Assessment   Medicare initial or wellness physical in the last year? Yes   Advance Directives   Does patient have a living will or MPOA No   Advance directive information given to the patient today? Patient Declined   Activities of Daily Living   Do you need help with dressing, bathing, or walking? No   Do you need help with shopping, housekeeping, medications, or finances? No   Do you have rugs in hallways, broken steps, or poor lighting? No   Do you have grab bars in your bathroom, non-slip strips in your tub, and hand rails on your stairs? No   Cognitive Function Screen   What is you age? 1   What is the time to the nearest hour? 1   What is the year? 1   What is the name of this clinic? 1   Can the patient recognize two persons (the doctor, the nurse, home help, etc.)? 1   What is the date of your birth? (day and month sufficient)  1   In what year did World War II end? 0   Who is the current president of the [place]? 1   Count from 20 down to 1? 1   What address did I give you earlier? 0   Total Score 8   Depression Screen   Little interest or pleasure in doing things. 1   Feeling down, depressed, or hopeless 1   PHQ 2 Total 2   Pain Score   Pain Score SEVEN   Substance Use Screening   In Past 12 MONTHS, how often have you used any tobacco product (for example, cigarettes, e-cigarettes, cigars, pipes, or smokeless tobacco)? Never   In the PAST 12 MONTHS, how often have you had 5 (men)/4 (women) or more drinks containing alcohol in one day? Never   In the PAST 12 months, how often have you used any prescription medications just for the feeling, more than prescribed, or that were not prescribed for you? Prescriptions may include: opioids, benzodiazepines, medications for ADHD Never   In the PAST 12 MONTHS, how often have you used any drugs, including marijuana, cocaine or crack, heroin, methamphetamine, hallucinogens, ecstasy/MDMA? Never   Fall Risk Assessment   Do you  feel unsteady when standing or walking? Yes   Do you worry about falling? Yes   Have you fallen in the past year? Yes   How many times have you fallen? Once   Were you ever injured from falling? No   Urinary Incontinence Screen   Do you ever leak urine when you don't want to? No   OTHER   Reported to Encounter Provider Yes

## 2024-01-05 NOTE — Nursing Note (Signed)
01/05/24 1600   Required: Location Test Performed At:   [facility]) [facility], [address] [place], [place] [address]   A1C   Time Performed 1600   A1C 5.2   References Ranges 4 - 6%   A1C Lot # [id number]   Expiration Date 09/19/25   Internal Control Valid yes   Lancet used on  Finger   Initials as

## 2024-01-18 ENCOUNTER — Other Ambulatory Visit (INDEPENDENT_AMBULATORY_CARE_PROVIDER_SITE_OTHER): Payer: Self-pay

## 2024-01-18 DIAGNOSIS — I10 Essential (primary) hypertension: Secondary | ICD-10-CM

## 2024-01-18 DIAGNOSIS — E785 Hyperlipidemia, unspecified: Secondary | ICD-10-CM

## 2024-01-18 DIAGNOSIS — E559 Vitamin D deficiency, unspecified: Secondary | ICD-10-CM

## 2024-01-18 DIAGNOSIS — E119 Type 2 diabetes mellitus without complications: Secondary | ICD-10-CM

## 2024-01-18 MED ORDER — AMLODIPINE 10 MG TABLET
10.0000 mg | ORAL_TABLET | Freq: Every day | ORAL | 1 refills | Status: DC
Start: 2024-01-18 — End: 2024-07-20

## 2024-01-18 MED ORDER — SIMVASTATIN 20 MG TABLET
20.0000 mg | ORAL_TABLET | Freq: Every evening | ORAL | 1 refills | Status: DC
Start: 2024-01-18 — End: 2024-07-20

## 2024-01-18 MED ORDER — SEMAGLUTIDE 0.25 MG OR 0.5 MG (2 MG/3 ML) SUBCUTANEOUS PEN INJECTOR
0.5000 mg | PEN_INJECTOR | SUBCUTANEOUS | 1 refills | Status: DC
Start: 2024-01-18 — End: 2024-04-05

## 2024-01-18 MED ORDER — LISINOPRIL 20 MG-HYDROCHLOROTHIAZIDE 12.5 MG TABLET
1.0000 | ORAL_TABLET | Freq: Two times a day (BID) | ORAL | 1 refills | Status: DC
Start: 2024-01-18 — End: 2024-07-20

## 2024-01-19 ENCOUNTER — Other Ambulatory Visit (INDEPENDENT_AMBULATORY_CARE_PROVIDER_SITE_OTHER): Payer: Self-pay

## 2024-01-23 ENCOUNTER — Ambulatory Visit (HOSPITAL_BASED_OUTPATIENT_CLINIC_OR_DEPARTMENT_OTHER): Payer: Self-pay | Admitting: Orthopaedic Surgery

## 2024-02-05 DIAGNOSIS — G4700 Insomnia, unspecified: Secondary | ICD-10-CM | POA: Insufficient documentation

## 2024-02-16 ENCOUNTER — Ambulatory Visit (HOSPITAL_BASED_OUTPATIENT_CLINIC_OR_DEPARTMENT_OTHER): Payer: Medicare Other | Admitting: Orthopaedic Surgery

## 2024-02-16 ENCOUNTER — Other Ambulatory Visit: Payer: Self-pay

## 2024-02-16 ENCOUNTER — Encounter (HOSPITAL_BASED_OUTPATIENT_CLINIC_OR_DEPARTMENT_OTHER)

## 2024-02-16 VITALS — Ht 62.0 in | Wt 191.6 lb

## 2024-02-16 DIAGNOSIS — M79604 Pain in right leg: Secondary | ICD-10-CM

## 2024-02-16 DIAGNOSIS — M1711 Unilateral primary osteoarthritis, right knee: Secondary | ICD-10-CM

## 2024-02-16 DIAGNOSIS — M25561 Pain in right knee: Secondary | ICD-10-CM

## 2024-02-16 DIAGNOSIS — M2391 Unspecified internal derangement of right knee: Secondary | ICD-10-CM

## 2024-02-16 NOTE — Progress Notes (Signed)
 [facility] MEDICAL OFFICE BUILDING  [facility]  [address]  [place] [address]  Dept: [phone]  Dept Fax: [phone]  Loc: [phone]  Loc Fax: [phone]    CC:    Chief Complaint   Patient presents with    Knee Pain     Right and leg pain       HPI:   Toni Tran is a 68 y.o. female who presents to the clinic for chronic right knee and leg pain.  She is here with her granddaughter describing a fall from 8 months ago.  She missed the last 2 steps and went to the ground.  She has some swelling of the knee.  She has had pain ever since.  No hip complaints.  Her knee feels stiff and hard to move.  It is problematic at night.  Twisting motions are uncomfortable.  No catching locking.  No previous problems with the knee.  She has tried all cons of pills and topicals to help with this.  Nothing is making better.    ROS:  GENERAL: No fever or chills  HEENT: no current nosebleed; no new onset visual disturbances  NECK: no swelling  CARDIOVASCULAR:  No severe chest pain  RESPIRATORY:  No severe SOA  ABDOMINAL: no severe abdominal pain  MSK: as per HPI  NEURO: no severe headache; as per HPI  SKIN: no current major rash  HEME: no major bruising or bleeding issues  EXTREMITIES: as per HPI  PSYCH: no delusions/hallucinations    PMH:   Past Medical History:   Diagnosis Date    Anxiety     Blood 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, right sided weakness    Depression     History of anesthesia complications     Required overnight stay after tarsal tunnel release d/t hypoxia per pt,     Hypercholesterolemia     Hyperlipidemia     Hypertension     MRSA infection     Obesity     Peripheral edema     right foot    Peripheral neuropathy     Right foot s/p tarsal tunnel release    Plantar fascial fibromatosis of right foot     PONV  (postoperative nausea and vomiting)     requests Anti Emetic    RLS (restless legs syndrome)     Snores     Never has had sleep study    Tarsal tunnel syndrome, right     Vitamin D deficiency     Wears glasses          Past Surgical History:   Procedure Laterality Date    HX APPENDECTOMY  childhood    HX EYE SURGERY Bilateral Childhood    Muscle Surgery    HX FOOT SURGERY Right 06/07/2017    Tarsal Tunnel release; FASCIOTOMY PLANTAR    HX SHOULDER SURGERY Right 06/2016    Exc. Bone Spur, Repair Rotator Cuff Tear    HX TUBAL LIGATION  1992    KNEE SURGERY Left           Patient Active Problem List    Diagnosis    Insomnia    OSA (obstructive sleep apnea)    Type 2 diabetes mellitus with hyperglycemia (CMS  HCC)    Left knee DJD    Chest pain, pleuritic    Acute bronchitis    Plantar fasciitis of right foot    Hypertension    Hyperlipidemia    Vitamin D deficiency    CVA (cerebrovascular accident) (CMS HCC)    Anxiety    Depression    Obesity    Plantar fascial fibromatosis of right foot    Tarsal tunnel syndrome of right side       MEDICATIONS:  Current Outpatient Medications   Medication Sig    amLODIPine (NORVASC) 10 mg Oral Tablet Take 1 Tablet (10 mg total) by mouth Once a day AMLODIPINE BESYLATE    ergocalciferol, vitamin D2, (DRISDOL) 1,250 mcg (50,000 unit) Oral Capsule Take 1 Capsule (50,000 Units total) by mouth Every 7 days for 90 days    lisinopriL-hydrochlorothiazide (ZESTORETIC) 20-12.5 mg Oral Tablet Take 1 Tablet by mouth Twice daily    semaglutide (OZEMPIC) 0.25 mg or 0.5 mg (2 mg/3 mL) Subcutaneous Pen Injector Inject 0.5 mg under the skin Every 7 days    simvastatin (ZOCOR) 20 mg Oral Tablet Take 1 Tablet (20 mg total) by mouth Every evening    traZODone (DESYREL) 50 mg Oral Tablet Take 1 Tablet (50 mg total) by mouth Every night for 90 days        ALLERGIES:   No Known Allergies     FAMILY HISTORY:  Family Medical History:       Problem Relation (Age of Onset)    Alzheimer's/Dementia Mother, Father     Arthritis-osteo Mother, Father    Blood Clots Father    Breast Cancer Maternal Aunt, Maternal Aunt    Congestive Heart Failure Father    Coronary Artery Disease Father    Diabetes Father    Heart Attack Mother, Father    High Cholesterol Mother, Father    Hypertension (High Blood Pressure) Mother, Father    Migraines Mother, Father    Sleep disorders Father    Stroke Mother, Father              SOCIAL HISTORY:   reports that she has never smoked. She has never used smokeless tobacco. She reports that she does not drink alcohol and does not use drugs.    PHYSICAL EXAM:  Ht 1.575 m (5\' 2" )   Wt 86.9 kg (191 lb 9.6 oz)   LMP  (LMP Unknown)   BMI 35.04 kg/m       GENERAL:  No acute distress; clean, appropriately dressed; well-nourished  HEENT: head normocephalic, atraumatic; nose w/out rhinorrhea/epistaxis  EYES: EOMI; vision intact   NECK: no JVD or obvious swelling  CARDIOVASCULAR: no pallor or cyanosis  RESPIRATORY:  non-labored breathing; symmetric chest wall rise bilat; no audible wheezing  ABDOMEN: no severe distention  NEURO:  Alert and oriented x3; conversant  EXTREMITIES: no diffuse obvious swelling  HEME: no diffuse obvious bruising or bleeding  PSYCH: normal affect and mood  MSK:  She hobbles as she ambulates.  Good hip mobility on the right.  Right knee has pain with hyperextension.  We can only get to about -3 degrees.  Flexion to about 115.  Collaterals are intact cruciate seem to be intact but she guards.  We will medial joint line is very uncomfortable McMurray is equivocal to positive.  Grade 1-2 effusion.  Sensor mechanism intact    IMAGING:   Plain x-rays.  See report  Images were personally reviewed and independently interpreted by me at today's  visit.   [name], DO  02/16/2024, 09:55       ASSESSMENT/PLAN:    ICD-10-CM    1. Right knee pain  M25.561 XR KNEE RIGHT 4 OR MORE VIEWS     PHYSICAL THERAPY (EVALUATE AND TREAT)      2. Right leg pain  M79.604 XR KNEE RIGHT 4 OR MORE VIEWS      PHYSICAL THERAPY (EVALUATE AND TREAT)      3. Internal derangement of right knee  M23.91 PHYSICAL THERAPY (EVALUATE AND TREAT)        Although there is some modest arthritic wear on the joint, I am suspicious for internal derangement such as medial meniscus tear pathology.  She will try can with conservative meds but I think she needs a assessment with physical therapy.  She will let us know how this is working in about 5 weeks.  I am anticipating that she will need an MRI and possibly arthroscopic surgery for this knee.  We will see her back after the MRI as needed.  If she is doing great, she will report this has well.  We anticipate a call or chat from her after she has initiated and gone through a course of therapy.  Questions answered  [name], DO  02/16/2024, 09:55    Portions of this note may be dictated using voice recognition software or a dictation service. Variances in spelling and vocabulary are possible and unintentional. Not all errors are caught/corrected. Please notify the There[facility] Parkin if any discrepancies are noted or if the meaning of any statement is not clear.

## 2024-03-12 ENCOUNTER — Ambulatory Visit
Admission: RE | Admit: 2024-03-12 | Discharge: 2024-03-12 | Disposition: A | Discharge status: Still In Hospital | Payer: Self-pay | Source: Ambulatory Visit | Attending: Orthopaedic Surgery | Admitting: Orthopaedic Surgery

## 2024-03-12 ENCOUNTER — Other Ambulatory Visit: Payer: Self-pay

## 2024-03-12 ENCOUNTER — Encounter (INDEPENDENT_AMBULATORY_CARE_PROVIDER_SITE_OTHER): Payer: Self-pay

## 2024-03-12 DIAGNOSIS — M25561 Pain in right knee: Secondary | ICD-10-CM | POA: Insufficient documentation

## 2024-03-12 DIAGNOSIS — M2391 Unspecified internal derangement of right knee: Secondary | ICD-10-CM | POA: Insufficient documentation

## 2024-03-12 DIAGNOSIS — M79604 Pain in right leg: Secondary | ICD-10-CM | POA: Insufficient documentation

## 2024-03-12 NOTE — PT Treatment (Signed)
 [facility]  Outpatient Physical Therapy  [address]  [place], [place]  [address]  ([phone]   (Fax) [phone]    Physical Therapy Treatment Note    Date: 03/12/2024  Patient's Name: Toni Tran  Date of Birth: 07-Aug-1956    PT Dx: R knee pain; internal derangement     Plan of Care Visit Number: 1    Recert date: 04/09/24    Authorized Insurance Visit Number: BMN    Next Scheduled Physician Appointment: TBD (Dr. Lynn Sark)    Subjective: see eval    Objective:   HEP: see media    MT: x52mins ( )  Stm medial knee, attempted grade 3-4 tibial mobs in flexion dangling off bed    TE: x50mins (1TE)  Education, development and performance of HEP    Assessment/Plan: see eval  Goal's:  (4 weeks):   04/09/24  1.  Patient will be independent with HEP for ROM/flexibility and strengthening for increased ease of ADL's.   2. Pt will display improved pt assessment instrument LKS >20% in order to improve functional activities.  3. Pt will display improved AROM of R knee to -10* in order to assist with functional activities.   4. Pt will display improved hip strength to 5/5 in order to assist with transitional movements.  5. Pt will display improved quad strength to >4+/5 in order to assist with terminal knee ext in gait.  6. Pt will perform the nustep for at level 3 without use of Ues in order to improve LE endurance to activity.   7. Pt will demonstrate improved balance through the performance of SLB >10secs.   8. Pt will report decrease pain to 3/10 at worst with activity to improve qol.    Level of Complexity: mod    Total Timed Treatment Minutes: (1TE, )    Total Treatment Time Minutes: with eval     Lulamae Skorupski E. Andy Bannister, PT, DPT, CLC

## 2024-03-12 NOTE — PT Evaluation (Signed)
 [facility]  Outpatient Physical Therapy  [address]  [place], [place]  [address]  ([phone]   (Fax) [phone]    Physical Therapy Initial Evaluation    Patient Name:  Toni Tran  DOB:  April 03, 1956    Start of Care:  03/12/2024  Diagnosis:   R knee pain; internal derangement   Onset of Injury:   >20mo ago  Next Physician FU:  TBD (Dr. Lynn Sark)    SUBJECTIVE:  Pt states that she feel 8 mo ago down the stairs leading to injury in her knee. She reports that he doctor wants her to have 5 therapy sessions before insurance will approve an MRI and possible surgery.  She voices that her knee has not improved during this time.  She still notes pain as high as 7-8/10 during the day to the inside of the knee and down the leg.   She reports at night the pain can increase to 10/10 and cause the leg to be heavy.  This causes her difficulties getting up in the night.   She can only amb up the stairs one leg at a time.   She also cannot completely straighten out her leg.      She takes Alieve arthritis meds which helps some, or tries to get off her leg and prop it up to help with the pain.  She had her L knee replaced by Dr. Lynn Sark in 2019.   Goal for therapy is less pain and improve her ROM.     History of Injury/PMHx:     Past Medical History:   Diagnosis Date    Anxiety     Blood thinned due to long-term anticoagulant use     Aspirin  325mg . 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, right sided weakness    Depression     History of anesthesia complications     Required overnight stay after tarsal tunnel release d/t hypoxia per pt,     Hypercholesterolemia     Hyperlipidemia     Hypertension     MRSA infection     Obesity     Peripheral edema     right foot    Peripheral neuropathy     Right foot s/p tarsal tunnel release    Plantar fascial fibromatosis of right foot     PONV (postoperative nausea and vomiting)      requests Anti Emetic    RLS (restless legs syndrome)     Snores     Never has had sleep study    Tarsal tunnel syndrome, right     Vitamin D  deficiency     Wears glasses          Past Surgical History:   Procedure Laterality Date    HX APPENDECTOMY  childhood    HX EYE SURGERY Bilateral Childhood    Muscle Surgery    HX FOOT SURGERY Right 06/07/2017    Tarsal Tunnel release; FASCIOTOMY PLANTAR    HX SHOULDER SURGERY Right 06/2016    Exc. Bone Spur, Repair Rotator Cuff Tear    HX TUBAL LIGATION  1992    KNEE SURGERY Left      Pain:  7/10(best)      10/10(worst)  Better:   sitting  Worse:   walking, stairs and transitional movements  Imaging Studies Performed:  xray, no MRI as of yet.    Medications:     Current Outpatient Medications   Medication Sig    amLODIPine  (NORVASC ) 10 mg Oral Tablet Take 1 Tablet (10 mg total) by mouth Once a day AMLODIPINE  BESYLATE    ergocalciferol , vitamin D2, (DRISDOL ) 1,250 mcg (50,000 unit) Oral Capsule Take 1 Capsule (50,000 Units total) by mouth Every 7 days for 90 days    lisinopriL -hydrochlorothiazide  (ZESTORETIC ) 20-12.5 mg Oral Tablet Take 1 Tablet by mouth Twice daily    semaglutide  (OZEMPIC ) 0.25 mg or 0.5 mg (2 mg/3 mL) Subcutaneous Pen Injector Inject 0.5 mg under the skin Every 7 days    simvastatin  (ZOCOR ) 20 mg Oral Tablet Take 1 Tablet (20 mg total) by mouth Every evening    traZODone  (DESYREL ) 50 mg Oral Tablet Take 1 Tablet (50 mg total) by mouth Every night for 90 days       OBJECTIVE:  Outcome Measure:    LKS see media.  ROM:   R knee   AROM: -15-120* (pt is guarding and unable to read a true knee extension)-- yet ambulates with fair knee ext  Patellar mobility--  unable to move due to guarding    Strength:   LE  Hip Flexion  R 4+/5  Hip Adduction R 4+/5  Hip Abduction R 4+/5  Knee Flexion  R 5/5  Knee Extension  R 4/5  Ankle DF  R 4+/5  Ankle PF  R 4+/5    Girth:   medial bursa +1    Sensation:   intact     Gait:   altered, slight limp, ER more noted on the R  than the L  SLB-- unable    Posture:   na    Palpation:   ttp grossly throughout, poor ability to allow therapist in joint mobilizations due to guarding     Other:      Personal Factors: 1-2  Body System Elements: ROM, Strength, LKS   Clinical Presentation: mod    ASSESSMENT:  Pt is a 68 y/o female who presents with R knee pain, due to a fall >75mo ago creating decreased ROM, increased pain, altered gait pattern and balance.  Pt voices the concerns for a torn mensicus and need for conservative tx in order to imaging and potential surgical outcomes.  In this time, therapist educated the pt on improving her flexibility, AROM and quad utilization in order to gain favorable post-operative outcomes.  Skilled care is medically necessary to address the following limitations through the given poc through therex, theract, nmr, gait training, manual therapy and modalities for pain control.  Pt will benefit from being seen 1-2x/wk for the next 4 wks to achieve the goals listed below.  Goal's:  (4 weeks):   04/09/24  1.  Patient will be independent with HEP for ROM/flexibility and strengthening for increased ease of ADL's.   2. Pt will display improved pt assessment instrument LKS >20% in order to improve functional activities.  3. Pt will display improved AROM of R knee to -10* in order to assist with functional activities.   4. Pt will display improved hip strength to 5/5 in order to assist with transitional movements.  5. Pt will display improved quad strength to >4+/5 in order to assist with terminal knee ext in gait.  6. Pt will perform the nustep for at level 3 without use of Ues in order to improve LE endurance to activity.   7. Pt will demonstrate improved  balance through the performance of SLB >10secs.   8. Pt will report decrease pain to 3/10 at worst with activity to improve qol.      The patient has been advised of his/her PT diagnosis, treatment, prognosis, POC, goals and HEP.  Yes  The patient/family has  given verbal consent for evaluation and treatment.     Yes    Total Evaluation Time: 20  Total Timed Treatment Minutes: 25  Total Treatment Time: 45      Atiba Kimberlin E. Andy Bannister, PT, DPT, CLC          ____________________________________________  Physician Signature    Date

## 2024-03-15 ENCOUNTER — Ambulatory Visit (INDEPENDENT_AMBULATORY_CARE_PROVIDER_SITE_OTHER)
Admission: RE | Admit: 2024-03-15 | Discharge: 2024-03-15 | Disposition: A | Discharge status: Still In Hospital | Source: Ambulatory Visit | Attending: Orthopaedic Surgery | Admitting: Orthopaedic Surgery

## 2024-03-15 ENCOUNTER — Other Ambulatory Visit: Payer: Self-pay

## 2024-03-15 ENCOUNTER — Ambulatory Visit (INDEPENDENT_AMBULATORY_CARE_PROVIDER_SITE_OTHER): Payer: Self-pay

## 2024-03-15 DIAGNOSIS — M25561 Pain in right knee: Secondary | ICD-10-CM | POA: Insufficient documentation

## 2024-03-15 DIAGNOSIS — M79604 Pain in right leg: Secondary | ICD-10-CM | POA: Insufficient documentation

## 2024-03-15 DIAGNOSIS — M2391 Unspecified internal derangement of right knee: Secondary | ICD-10-CM | POA: Insufficient documentation

## 2024-03-15 NOTE — PT Treatment (Signed)
 [facility]  Outpatient Physical Therapy  [address]  [place], [place]  [address]  ([phone]   (Fax) [phone]    Physical Therapy Treatment Note    Date: 03/15/2024  Patient's Name: Toni Tran  Date of Birth: 20-Dec-1955    PT Dx: R knee pain; internal derangement     Plan of Care Visit Number: 2    Recert date: 04/09/24    Authorized Insurance Visit Number: BMN    Next Scheduled Physician Appointment: TBD (Dr. Lynn Sark)    Subjective:   Pt states that she is in more pain this morning for some reason.  She voices her pain is 8/10, but after the eval it was about 7/10.  She has been trying to do her HEP.    Objective:   HEP: see media    MT: x34mins ( )  Stm medial knee, attempted grade 3-4 tibial mobs in flexion dangling off bed    TE: x102mins (3TE)  Education, development and performance of HEP  Nustep arms 10, seat 6,   Slant board 3x30"  HS stretch 2x30"  B  Calf raises x20  Hip 3 way x10    Seated hip adduction x20  Seated hip abduction GTB x20  Seated marching GTB x10  LAQ 2x10    Supine quad sets x10  SAQ x10 each   Heel slides x10  Bridges x10        Assessment/Plan:   Pt arrived with improved gait pattern, and observation of improved knee extension.  She is very apprehensive with PT guided exercise tx.   However she was able to complete the following above without observable discomfort.   Most of her pain today in medial knee. MT provided with bolster under her knee and passive knee extension completed with varied reps and less resistance by the pt.   Ended with pain reported to decrease to 6/10.    Goal's:  (4 weeks):   04/09/24  1.  Patient will be independent with HEP for ROM/flexibility and strengthening for increased ease of ADL's.   2. Pt will display improved pt assessment instrument LKS >20% in order to improve functional activities.  3. Pt will display improved AROM of R knee to -10* in order to assist with functional activities.   4. Pt will display improved hip  strength to 5/5 in order to assist with transitional movements.  5. Pt will display improved quad strength to >4+/5 in order to assist with terminal knee ext in gait.  6. Pt will perform the nustep for at level 3 without use of Ues in order to improve LE endurance to activity.   7. Pt will demonstrate improved balance through the performance of SLB >10secs.   8. Pt will report decrease pain to 3/10 at worst with activity to improve qol.    Level of Complexity: mod    Total Timed Treatment Minutes: (3TE, )    Total Treatment Time Minutes: 53 mins      Toni Tran E. Andy Bannister, PT, DPT, CLC

## 2024-03-16 ENCOUNTER — Other Ambulatory Visit: Payer: Self-pay

## 2024-03-21 ENCOUNTER — Other Ambulatory Visit: Payer: Self-pay

## 2024-03-21 ENCOUNTER — Ambulatory Visit (INDEPENDENT_AMBULATORY_CARE_PROVIDER_SITE_OTHER)
Admission: RE | Admit: 2024-03-21 | Discharge: 2024-03-21 | Disposition: A | Discharge status: Still In Hospital | Payer: Self-pay | Source: Ambulatory Visit | Attending: Orthopaedic Surgery

## 2024-03-21 NOTE — PT Treatment (Signed)
 [facility]  Outpatient Physical Therapy  [address]  [place], [place]  [address]  ([phone]   (Fax) [phone]    Physical Therapy Treatment Note    Date: 03/21/2024  Patient's Name: Toni Tran  Date of Birth: [birth year]    PT Dx: R knee pain; internal derangement     Plan of Care Visit Number: 3    Recert date: 04/09/24    Authorized Insurance Visit Number: BMN    Next Scheduled Physician Appointment: TBD (Dr. Marden Noble)    Subjective:   Pt states that she had a family member purchase a stationary bike for home.  She is trying to work up to .  Her knee pain is still reported but able to work through current exercise routine.     Objective:   HEP: see media    MT: x94mins ( )  Stm medial knee, attempted grade 3-4 tibial mobs in flexion dangling off bed    TE: x34mins (2TE)  Education, development and performance of HEP  Nustep arms 10, seat 6,   Slant board 3x30"  HS stretch 2x30"  B  Calf raises x20  Hip 3 way x10      HELD--  Seated hip adduction x20  Seated hip abduction GTB x20  Seated marching GTB x10  LAQ 2x10  Supine quad sets x10  SAQ x10 each   Heel slides x10  Bridges x10        Assessment/Plan:   Pt arrived with improved gait pattern, and observation of improved knee extension.  Began on nustep followed by standing exercises without complications but need for B arm support.   Pt again communicated with therapist about completing 5 tx before determining if surgery is needed.   Encouraged the pt that we would continue with the POC until the physician makes that call.     Goal's:  (4 weeks):   04/09/24  1.  Patient will be independent with HEP for ROM/flexibility and strengthening for increased ease of ADL's.   2. Pt will display improved pt assessment instrument LKS >20% in order to improve functional activities.  3. Pt will display improved AROM of R knee to -10* in order to assist with functional activities.   4. Pt will display improved hip strength to 5/5 in order to  assist with transitional movements.  5. Pt will display improved quad strength to >4+/5 in order to assist with terminal knee ext in gait.  6. Pt will perform the nustep for at level 3 without use of Ues in order to improve LE endurance to activity.   7. Pt will demonstrate improved balance through the performance of SLB >10secs.   8. Pt will report decrease pain to 3/10 at worst with activity to improve qol.    Level of Complexity: mod    Total Timed Treatment Minutes: (2TE, )    Total Treatment Time Minutes: 28 mins      Melodee Lupe E. Maisie Fus, PT, DPT, CLC

## 2024-03-23 ENCOUNTER — Ambulatory Visit
Admission: RE | Admit: 2024-03-23 | Discharge: 2024-03-23 | Disposition: A | Discharge status: Still In Hospital | Payer: Self-pay | Source: Ambulatory Visit | Attending: Orthopaedic Surgery | Admitting: Orthopaedic Surgery

## 2024-03-23 ENCOUNTER — Other Ambulatory Visit: Payer: Self-pay

## 2024-03-23 NOTE — PT Treatment (Signed)
 [facility]  Outpatient Physical Therapy  [address]  [place], [place]  [address]  ([phone]   (Fax) [phone]    Physical Therapy Treatment Note    Date: 03/23/2024  Patient's Name: Toni Tran  Date of Birth: Nov 15, 1956    PT Dx: R knee pain; internal derangement     Plan of Care Visit Number: 4    Recert date: 04/09/24    Authorized Insurance Visit Number: BMN    Next Scheduled Physician Appointment: TBD (Dr. Marden Noble)    Subjective:   Pt states that she has barely slept.  She is having increase pain and wanting to FU with physician for surgery.     Objective:   HEP: see media    MT: x52mins ( )  Stm medial knee, attempted grade 3-4 tibial mobs in flexion dangling off bed    TE: x67mins (3TE)  Education, development and performance of HEP  Nustep arms 10, seat 6,   Slant board 3x30"  HS stretch 3x30"  B  Calf raises x20  Hip 3 way x10  Supine hip adduction x15  Supine hip abduction GTB x15  Supine marching GTB x15  LAQ 2x10  Supine quad sets SLR x10      SAQ x10 each   Heel slides x10  Bridges x10        Assessment/Plan:   Pt arrived with inconsistent gait pattern, and reports very little benefit to PT.   However, her goal has been surgical intervention post MRI approval.  Continued with ROM exercises and flexibility along with quad strengthening to gain TKE.     Encouraged the pt that we would continue with the POC until the physician makes that call.     Goal's:  (4 weeks):   04/09/24  1.  Patient will be independent with HEP for ROM/flexibility and strengthening for increased ease of ADL's.   met  2. Pt will display improved pt assessment instrument LKS >20% in order to improve functional activities.  3. Pt will display improved AROM of R knee to -10* in order to assist with functional activities.   4. Pt will display improved hip strength to 5/5 in order to assist with transitional movements.  5. Pt will display improved quad strength to >4+/5 in order to assist with terminal  knee ext in gait.  6. Pt will perform the nustep for at level 3 without use of Ues in order to improve LE endurance to activity.   Not met  7. Pt will demonstrate improved balance through the performance of SLB >10secs.   8. Pt will report decrease pain to 3/10 at worst with activity to improve qol.  Not met    Level of Complexity: mod    Total Timed Treatment Minutes: (3TE, )    Total Treatment Time Minutes: 38 mins      Demesha Boorman E. Maisie Fus, PT, DPT, CLC

## 2024-03-26 ENCOUNTER — Ambulatory Visit (INDEPENDENT_AMBULATORY_CARE_PROVIDER_SITE_OTHER): Payer: Self-pay

## 2024-03-28 ENCOUNTER — Ambulatory Visit (INDEPENDENT_AMBULATORY_CARE_PROVIDER_SITE_OTHER): Payer: Self-pay

## 2024-04-04 ENCOUNTER — Ambulatory Visit
Admission: RE | Admit: 2024-04-04 | Discharge: 2024-04-04 | Disposition: A | Discharge status: Still In Hospital | Payer: Self-pay | Source: Ambulatory Visit | Attending: Orthopaedic Surgery | Admitting: Orthopaedic Surgery

## 2024-04-04 ENCOUNTER — Telehealth (HOSPITAL_COMMUNITY): Payer: Self-pay | Admitting: Orthopaedic Surgery

## 2024-04-04 ENCOUNTER — Other Ambulatory Visit: Payer: Self-pay

## 2024-04-04 ENCOUNTER — Ambulatory Visit (HOSPITAL_COMMUNITY)

## 2024-04-04 DIAGNOSIS — E785 Hyperlipidemia, unspecified: Secondary | ICD-10-CM | POA: Insufficient documentation

## 2024-04-04 DIAGNOSIS — I10 Essential (primary) hypertension: Secondary | ICD-10-CM | POA: Insufficient documentation

## 2024-04-04 DIAGNOSIS — E559 Vitamin D deficiency, unspecified: Secondary | ICD-10-CM | POA: Insufficient documentation

## 2024-04-04 DIAGNOSIS — G4700 Insomnia, unspecified: Secondary | ICD-10-CM | POA: Insufficient documentation

## 2024-04-04 DIAGNOSIS — E1165 Type 2 diabetes mellitus with hyperglycemia: Secondary | ICD-10-CM | POA: Insufficient documentation

## 2024-04-04 DIAGNOSIS — Z130 Encounter for screening for diseases of the blood and blood-forming organs and certain disorders involving the immune mechanism: Secondary | ICD-10-CM | POA: Insufficient documentation

## 2024-04-04 LAB — COMPREHENSIVE METABOLIC PNL, FASTING
ALBUMIN: 4.1 g/dL (ref 3.4–4.8)
ALKALINE PHOSPHATASE: 80 U/L (ref 55–145)
ALT (SGPT): 22 U/L (ref <31)
ANION GAP: 12 mmol/L (ref 4–13)
AST (SGOT): 24 U/L (ref 11–34)
BILIRUBIN TOTAL: 0.8 mg/dL (ref 0.3–1.3)
BUN/CREA RATIO: 18 (ref 6–22)
BUN: 14 mg/dL (ref 8–25)
CALCIUM: 9.5 mg/dL (ref 8.6–10.3)
CHLORIDE: 104 mmol/L (ref 96–111)
CO2 TOTAL: 24 mmol/L (ref 23–31)
CREATININE: 0.78 mg/dL (ref 0.60–1.05)
ESTIMATED GFR - FEMALE: 83 mL/min/BSA (ref >=60)
GLUCOSE: 102 mg/dL — ABNORMAL HIGH (ref 70–99)
POTASSIUM: 4.1 mmol/L (ref 3.5–5.1)
PROTEIN TOTAL: 7.2 g/dL (ref 5.6–7.6)
SODIUM: 140 mmol/L (ref 136–145)

## 2024-04-04 LAB — CBC W/AUTO DIFF
BASOPHIL #: <0.1 10*3/uL (ref <=0.20)
BASOPHIL %: 0.7 %
EOSINOPHIL #: 0.21 10*3/uL (ref <=0.50)
EOSINOPHIL %: 2 %
HCT: 40.8 % (ref 34.8–46.0)
HGB: 14.5 g/dL (ref 11.5–16.0)
IMMATURE GRANULOCYTE #: <0.1 10*3/uL (ref <0.10)
IMMATURE GRANULOCYTE %: 0.7 % (ref 0.0–1.0)
LYMPHOCYTE #: 2.34 10*3/uL (ref 1.00–4.80)
LYMPHOCYTE %: 22.1 %
MCH: 33.3 pg — ABNORMAL HIGH (ref 26.0–32.0)
MCHC: 35.5 g/dL (ref 31.0–35.5)
MCV: 93.8 fL (ref 78.0–100.0)
MONOCYTE #: 0.77 10*3/uL (ref 0.20–1.10)
MONOCYTE %: 7.3 %
MPV: 12.1 fL (ref 8.7–12.5)
NEUTROPHIL #: 7.12 10*3/uL (ref 1.50–7.70)
NEUTROPHIL %: 67.2 %
PLATELETS: 228 10*3/uL (ref 150–400)
RBC: 4.35 10*6/uL (ref 3.85–5.22)
RDW-CV: 12 % (ref 11.5–15.5)
WBC: 10.6 10*3/uL (ref 3.7–11.0)

## 2024-04-04 LAB — LIPID PANEL
CHOL/HDL RATIO: 3.2
CHOLESTEROL: 156 mg/dL (ref 100–200)
HDL CHOL: 49 mg/dL — ABNORMAL LOW (ref >=50)
LDL CALC: 81 mg/dL (ref <100)
NON-HDL: 107 mg/dL (ref <=190)
TRIGLYCERIDES: 150 mg/dL — ABNORMAL HIGH (ref <150)
VLDL CALC: 23 mg/dL (ref <30)

## 2024-04-04 NOTE — PT Treatment (Signed)
 [facility]  Outpatient Physical Therapy  [address]  [place], [place]  [address]  ([phone]   (Fax) [phone]    Physical Therapy Treatment Note    Date: 04/04/2024  Patient's Name: Toni Tran  Date of Birth: 07/20/1956    PT Dx: R knee pain; internal derangement     Plan of Care Visit Number: 5    Recert date: 04/09/24    Authorized Insurance Visit Number: BMN    Next Scheduled Physician Appointment: TBD (Dr. Lynn Sark)    Subjective:   Pt states that she has been sick which prevented her from coming to therapy.  Since then she says she does have the strength to do anything.  She voices the pain in her leg is so bad throughout the entire leg.     Objective:   HEP: see media    MT: x88mins ( )  Stm medial knee, attempted grade 3-4 tibial mobs in flexion dangling off bed    TE: x37mins (3TE)  Education, development and performance of HEP  Nustep arms 10, seat 6,   Slant board 3x30"  HS stretch 3x30"  B  12" step knee flexion x10  Calf raises x20  Hip 3 way x15  Supine hip adduction x20  Supine hip abduction GTB x20  Supine marching GTB 2x10  LAQ 2x10 with adduction   Supine quad sets SLR x10  SAQ x10 each   Heel slides x10  Bridges x10    Assessment/Plan:   Pt arrived with inconsistent gait pattern, and reports very little benefit to PT.   However, her goal has been surgical intervention post MRI approval.  Continued with ROM exercises and flexibility along with quad strengthening to gain TKE.    She would often guard with HS contraction leading to gaining knee flexion. MT attempted with continued blocking unless in conversation. Passive ext of the R knee achieved to -5* with distraction.     Goal's:  (4 weeks):   04/09/24  1.  Patient will be independent with HEP for ROM/flexibility and strengthening for increased ease of ADL's.   met  2. Pt will display improved pt assessment instrument LKS >20% in order to improve functional activities.  3. Pt will display improved AROM of R knee to  -10* in order to assist with functional activities.   4. Pt will display improved hip strength to 5/5 in order to assist with transitional movements.  5. Pt will display improved quad strength to >4+/5 in order to assist with terminal knee ext in gait.  6. Pt will perform the nustep for at level 3 without use of Ues in order to improve LE endurance to activity.   Not met  7. Pt will demonstrate improved balance through the performance of SLB >10secs.   8. Pt will report decrease pain to 3/10 at worst with activity to improve qol.  Not met    Level of Complexity: mod    Total Timed Treatment Minutes: (3TE, )    Total Treatment Time Minutes: 45 mins      Hurschel Paynter E. Andy Bannister, PT, DPT, CLC

## 2024-04-04 NOTE — Telephone Encounter (Signed)
 Patient called in regards to MRI order to be placed by Dr Lynn Sark. She stated she was told to call after her 5th appointment with PT to have the order placed. She just completed her 5th visit today.    Let pt know I would get a message sent to the office for her to make them aware, and to get the order placed.    Thanks  Wilbur Handing, [employer]  04/04/2024, 15:49

## 2024-04-05 ENCOUNTER — Encounter (INDEPENDENT_AMBULATORY_CARE_PROVIDER_SITE_OTHER): Payer: Self-pay

## 2024-04-05 ENCOUNTER — Other Ambulatory Visit (INDEPENDENT_AMBULATORY_CARE_PROVIDER_SITE_OTHER): Payer: Self-pay

## 2024-04-05 ENCOUNTER — Ambulatory Visit: Payer: Self-pay

## 2024-04-05 VITALS — BP 118/80 | HR 80 | Temp 97.3°F | Ht 62.0 in | Wt 188.4 lb

## 2024-04-05 DIAGNOSIS — E785 Hyperlipidemia, unspecified: Secondary | ICD-10-CM | POA: Insufficient documentation

## 2024-04-05 DIAGNOSIS — I10 Essential (primary) hypertension: Secondary | ICD-10-CM | POA: Insufficient documentation

## 2024-04-05 DIAGNOSIS — F419 Anxiety disorder, unspecified: Secondary | ICD-10-CM | POA: Insufficient documentation

## 2024-04-05 DIAGNOSIS — F32A Depression, unspecified: Secondary | ICD-10-CM | POA: Insufficient documentation

## 2024-04-05 DIAGNOSIS — G4700 Insomnia, unspecified: Secondary | ICD-10-CM

## 2024-04-05 DIAGNOSIS — E119 Type 2 diabetes mellitus without complications: Secondary | ICD-10-CM | POA: Insufficient documentation

## 2024-04-05 DIAGNOSIS — G4733 Obstructive sleep apnea (adult) (pediatric): Secondary | ICD-10-CM | POA: Insufficient documentation

## 2024-04-05 DIAGNOSIS — I639 Cerebral infarction, unspecified: Secondary | ICD-10-CM | POA: Insufficient documentation

## 2024-04-05 DIAGNOSIS — E559 Vitamin D deficiency, unspecified: Secondary | ICD-10-CM | POA: Insufficient documentation

## 2024-04-05 DIAGNOSIS — Z6834 Body mass index (BMI) 34.0-34.9, adult: Secondary | ICD-10-CM

## 2024-04-05 DIAGNOSIS — E669 Obesity, unspecified: Secondary | ICD-10-CM | POA: Insufficient documentation

## 2024-04-05 LAB — VITAMIN D 25 TOTAL: VITAMIN D 25, TOTAL: 17.9 ng/mL — ABNORMAL LOW (ref 20.0–100.0)

## 2024-04-05 LAB — POCT HGB A1C: POCT HGB A1C: 5.1 % (ref 4–6)

## 2024-04-05 MED ORDER — ERGOCALCIFEROL (VITAMIN D2) 1,250 MCG (50,000 UNIT) CAPSULE
50000.0000 [IU] | ORAL_CAPSULE | ORAL | 0 refills | Status: AC
Start: 2024-04-05 — End: 2024-07-04

## 2024-04-05 MED ORDER — SEMAGLUTIDE 0.25 MG OR 0.5 MG (2 MG/3 ML) SUBCUTANEOUS PEN INJECTOR
0.5000 mg | PEN_INJECTOR | SUBCUTANEOUS | 1 refills | Status: AC
Start: 2024-04-05 — End: ?

## 2024-04-05 MED ORDER — TRAZODONE 50 MG TABLET
50.0000 mg | ORAL_TABLET | Freq: Every evening | ORAL | 0 refills | Status: DC
Start: 2024-04-05 — End: 2024-07-03
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
## 2024-04-05 NOTE — Progress Notes (Signed)
 FAMILY MEDICINE, [facility]  [address]  [place] [address]  Operated by [facility]    Encounter Date: 04/05/2024      Patient ID:  Toni Tran   MRN: [id number]    DOB: June 24, 1956   Age: 68 y.o.      Subjective:     Chief Complaint              Follow Up 3 Months     Lab Results              HPI:  This is a 68 y.o. female here for chronic management. PMH of hyperlipidemia, hypertension, OSA, CVA, DM type II, vitamin d  deficiency, depression, anxiety, insomnia, and obesity. At her last appointment on 01/05/24 she was started on Trazodone . States only took this for a couple of weeks due to daytime drowsiness. Currently only sleeping 4 hours a night. Denies chest pain, shortness of breath, nausea, vomiting, or diarrhea.     Labs reviewed from 04/04/24: CMP with glucose 102. Vitamin D  17.9. CBC, lipid panel WNL.     Current Outpatient Medications   Medication Sig    amLODIPine  (NORVASC ) 10 mg Oral Tablet Take 1 Tablet (10 mg total) by mouth Once a day AMLODIPINE  BESYLATE    ergocalciferol , vitamin D2, (DRISDOL ) 1,250 mcg (50,000 unit) Oral Capsule Take 1 Capsule (50,000 Units total) by mouth Every 7 days for 90 days    lisinopriL -hydrochlorothiazide  (ZESTORETIC ) 20-12.5 mg Oral Tablet Take 1 Tablet by mouth Twice daily    semaglutide  (OZEMPIC ) 0.25 mg or 0.5 mg (2 mg/3 mL) Subcutaneous Pen Injector Inject 0.5 mg under the skin Every 7 days    simvastatin  (ZOCOR ) 20 mg Oral Tablet Take 1 Tablet (20 mg total) by mouth Every evening    traZODone  (DESYREL ) 50 mg Oral Tablet Take 1 Tablet (50 mg total) by mouth Every night for 90 days       No Known Allergies   Past Medical History:   Diagnosis Date    Anxiety     Blood thinned due to long-term anticoagulant use     Aspirin  325mg . 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, right sided weakness    Depression     History of anesthesia complications      Required overnight stay after tarsal tunnel release d/t hypoxia per pt,     Hypercholesterolemia     Hyperlipidemia     Hypertension     MRSA infection     Obesity     Peripheral edema     right foot    Peripheral neuropathy     Right foot s/p tarsal tunnel release    Plantar fascial fibromatosis of right foot     PONV (postoperative nausea and vomiting)     requests Anti Emetic    RLS (restless legs syndrome)     Snores     Never has had sleep study    Tarsal tunnel syndrome, right     Vitamin D  deficiency     Wears glasses       Past Surgical History:   Procedure Laterality Date    HX APPENDECTOMY  childhood    HX EYE SURGERY Bilateral Childhood    Muscle Surgery    HX FOOT SURGERY Right 06/07/2017    Tarsal Tunnel release; FASCIOTOMY PLANTAR  HX SHOULDER SURGERY Right 06/2016    Exc. Bone Spur, Repair Rotator Cuff Tear    HX TUBAL LIGATION  1992    KNEE SURGERY Left       Family Medical History:       Problem Relation (Age of Onset)    Alzheimer's/Dementia Mother, Father    Arthritis-osteo Mother, Father    Blood Clots Father    Breast Cancer Maternal Aunt, Maternal Aunt    Congestive Heart Failure Father    Coronary Artery Disease Father    Diabetes Father    Heart Attack Mother, Father    High Cholesterol Mother, Father    Hypertension (High Blood Pressure) Mother, Father    Migraines Mother, Father    Sleep disorders Father    Stroke Mother, Father            Social History     Socioeconomic History    Marital status: Single   Tobacco Use    Smoking status: Never    Smokeless tobacco: Never   Vaping Use    Vaping status: Never Used   Substance and Sexual Activity    Alcohol  use: No    Drug use: No     Social Determinants of Health     Health Literacy: Low Risk  (10/20/2023)    Health Literacy     SDOH Health Literacy: Never      ROS   All pertinent review of systems both positive and negative as per HPI.      Objective:   Vitals: BP 118/80   Pulse 80   Temp 36.3 C (97.3 F) (Thermal Scan)   Ht 1.575 m (5'  2")   Wt 85.5 kg (188 lb 6.4 oz)   LMP  (LMP Unknown)   SpO2 98%   BMI 34.46 kg/m         Physical Exam  Vitals and nursing note reviewed.   Constitutional:       Appearance: She is well-developed.   HENT:      Head: Normocephalic and atraumatic.   Eyes:      Conjunctiva/sclera: Conjunctivae normal.   Neck:      Vascular: No JVD.   Cardiovascular:      Rate and Rhythm: Normal rate and regular rhythm.      Heart sounds: No murmur heard.     No friction rub. No gallop.   Pulmonary:      Effort: Pulmonary effort is normal. No respiratory distress.      Breath sounds: No stridor. No wheezing, rhonchi or rales.   Abdominal:      General: There is no distension.      Palpations: Abdomen is soft.   Musculoskeletal:      Cervical back: Normal range of motion.   Skin:     General: Skin is warm and dry.   Neurological:      Mental Status: She is alert and oriented to person, place, and time.   Psychiatric:         Mood and Affect: Mood normal.         Behavior: Behavior normal.         Thought Content: Thought content normal.         Judgment: Judgment normal.            Assessment & Plan:     ENCOUNTER DIAGNOSES     ICD-10-CM   1. Diabetes mellitus, type II  E11.9   2. Hyperlipidemia, unspecified  hyperlipidemia type  E78.5   3. Hypertension, unspecified type  I10   4. OSA (obstructive sleep apnea)  G47.33   5. Cerebrovascular accident (CVA), unspecified mechanism (CMS HCC)  I63.9   6. Vitamin D  deficiency  E55.9   7. Depression, unspecified depression type  F32.A   8. Anxiety  F41.9   9. Insomnia, unspecified type  G47.00   10. Obesity, unspecified class, unspecified obesity type, unspecified whether serious comorbidity present  E66.9      Orders Placed This Encounter    Emotional/Behavioral Assessment (65784)    VITAMIN D  25 TOTAL    POCT HGB A1C    ergocalciferol , vitamin D2, (DRISDOL ) 1,250 mcg (50,000 unit) Oral Capsule    semaglutide  (OZEMPIC ) 0.25 mg or 0.5 mg (2 mg/3 mL) Subcutaneous Pen Injector      This is a  68 y.o. female here with     Diabetes type II/hyperlipidemia   -A1C 5.1 in the office today  -Continue Ozempic  0.5 mg subcutaneous once q.7 days   -Continue Zocor  20 mg p.o. q.pm      Hypertension, controlled  -Continue Norvasc  10 mg p.o. q.d   -Continue Zestoretic  20-12.5 mg p.o. b.i.d     OSA  -Not currently wearing CPAP  -Previously referred to pulmonology      Hx of CVA  -Emphasize importance of control of HTN and DM  -Continue Aspirin  81 mg p.o. q.d   -Continue Norvasc  10 mg p.o. q.d   -Continue Zestoretic  20-12.5 mg p.o. b.i.d  -Continue Zocor  20 mg p.o q.pm      Vitamin D  deficiency  -Continue high dose vitamin D  50,000 units p.o. once weekly   -Repeat vitamin D  level in 3 months      Depression/anxiety/insomnia   -Advised decreasing Trazodone  from 50 to 25 mg p.o. q.pm. Having too much daytime drowsiness on the 50 mg dose.     Obesity  -Lifestyle modifications/Ozempic  as above      Follow-up in 3 months for chronic management/A1C/vitamin D        Jennings Mohr, PA-C

## 2024-04-05 NOTE — Nursing Note (Signed)
 04/05/24 1500   Required: Location Test Performed At:   [facility] [place], [address], [place], [place] [address]   A1C   Time Performed 1550   A1C 5.1   References Ranges 4 - 6%   A1C Lot # [id number]   Expiration Date 01/01/26   Internal Control Valid yes   Lancet used on  Finger   Initials as

## 2024-04-06 ENCOUNTER — Ambulatory Visit (INDEPENDENT_AMBULATORY_CARE_PROVIDER_SITE_OTHER): Payer: Self-pay

## 2024-04-10 ENCOUNTER — Ambulatory Visit
Admission: RE | Admit: 2024-04-10 | Discharge: 2024-04-10 | Disposition: A | Discharge status: Home / Self Care | Payer: Self-pay | Source: Ambulatory Visit | Attending: Orthopaedic Surgery | Admitting: Orthopaedic Surgery

## 2024-04-10 ENCOUNTER — Other Ambulatory Visit (HOSPITAL_COMMUNITY): Payer: Self-pay | Admitting: Orthopaedic Surgery

## 2024-04-10 DIAGNOSIS — M1711 Unilateral primary osteoarthritis, right knee: Secondary | ICD-10-CM

## 2024-04-10 DIAGNOSIS — M2391 Unspecified internal derangement of right knee: Secondary | ICD-10-CM

## 2024-04-11 NOTE — Telephone Encounter (Signed)
 MRI was ordered.  Adelina Homme, MA

## 2024-04-11 NOTE — Telephone Encounter (Signed)
 Called the patient left a VM stating that Dr. Lynn Sark did order a MRI and that scheduling will be calling them within a week.  Adelina Homme, MA

## 2024-04-12 ENCOUNTER — Ambulatory Visit (INDEPENDENT_AMBULATORY_CARE_PROVIDER_SITE_OTHER): Payer: Self-pay

## 2024-04-18 ENCOUNTER — Ambulatory Visit (INDEPENDENT_AMBULATORY_CARE_PROVIDER_SITE_OTHER): Payer: Self-pay

## 2024-04-19 ENCOUNTER — Ambulatory Visit (HOSPITAL_COMMUNITY): Payer: Self-pay | Admitting: Orthopaedic Surgery

## 2024-04-19 ENCOUNTER — Other Ambulatory Visit: Payer: Self-pay

## 2024-04-19 ENCOUNTER — Ambulatory Visit
Admission: RE | Admit: 2024-04-19 | Discharge: 2024-04-19 | Disposition: A | Discharge status: Home / Self Care | Source: Ambulatory Visit | Attending: Orthopaedic Surgery | Admitting: Orthopaedic Surgery

## 2024-04-19 DIAGNOSIS — M1711 Unilateral primary osteoarthritis, right knee: Secondary | ICD-10-CM

## 2024-04-19 DIAGNOSIS — M2391 Unspecified internal derangement of right knee: Secondary | ICD-10-CM | POA: Insufficient documentation

## 2024-04-20 ENCOUNTER — Ambulatory Visit (INDEPENDENT_AMBULATORY_CARE_PROVIDER_SITE_OTHER): Payer: Self-pay

## 2024-04-20 NOTE — Telephone Encounter (Addendum)
 Called patient left a VM to make an appt for the patient to come and discuss her MRI results.  Adelina Homme, MA  04/20/24  2:18pm      ----- Message from Howard Macho, CMA sent at 04/20/2024  9:14 AM EDT -----    ----- Message -----  From: Cloyde Daring, DO  Sent: 04/19/2024   5:37 PM EDT  To: #    Please call patient with the lab results    TO DISCUSS IN THE OFFICE

## 2024-04-24 ENCOUNTER — Ambulatory Visit (INDEPENDENT_AMBULATORY_CARE_PROVIDER_SITE_OTHER): Payer: Self-pay

## 2024-04-26 ENCOUNTER — Ambulatory Visit: Payer: Self-pay

## 2024-05-16 ENCOUNTER — Other Ambulatory Visit: Payer: Self-pay

## 2024-05-17 ENCOUNTER — Encounter (HOSPITAL_BASED_OUTPATIENT_CLINIC_OR_DEPARTMENT_OTHER): Payer: Self-pay | Admitting: Orthopaedic Surgery

## 2024-05-17 ENCOUNTER — Ambulatory Visit (INDEPENDENT_AMBULATORY_CARE_PROVIDER_SITE_OTHER): Payer: Self-pay | Admitting: Orthopaedic Surgery

## 2024-05-17 VITALS — Ht 62.0 in | Wt 188.4 lb

## 2024-05-17 DIAGNOSIS — M2391 Unspecified internal derangement of right knee: Secondary | ICD-10-CM

## 2024-05-17 DIAGNOSIS — M1711 Unilateral primary osteoarthritis, right knee: Secondary | ICD-10-CM

## 2024-05-17 DIAGNOSIS — M23351 Other meniscus derangements, posterior horn of lateral meniscus, right knee: Secondary | ICD-10-CM

## 2024-05-17 NOTE — Progress Notes (Signed)
 [facility] MEDICAL OFFICE BUILDING  [facility]  [address]  [place] [address]  Dept: [phone]  Dept Fax: [phone]  Loc: [phone]  Loc Fax: [phone]    CC:    Chief Complaint   Patient presents with    MRI Results     Right knee       HPI:   Toni Tran is a 68 y.o. female who presents to the clinic for discussion after MRI study of the right knee.  Continues to hurt.  Problems if extended now over a year of time.  She is quite uncomfortable with any torquing or twisting maneuver around her knee..    ROS:  GENERAL: No fever or chills  HEENT: no current nosebleed; no new onset visual disturbances  NECK: no swelling  CARDIOVASCULAR:  No severe chest pain  RESPIRATORY:  No severe SOA  ABDOMINAL: no severe abdominal pain  MSK: as per HPI  NEURO: no severe headache; as per HPI  SKIN: no current major rash  HEME: no major bruising or bleeding issues  EXTREMITIES: as per HPI  PSYCH: no delusions/hallucinations    PMH:   Past Medical History:   Diagnosis Date    Anxiety     Blood thinned due to long-term anticoagulant use     Aspirin  325mg . 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, right sided weakness    Depression     History of anesthesia complications     Required overnight stay after tarsal tunnel release d/t hypoxia per pt,     Hypercholesterolemia     Hyperlipidemia     Hypertension     MRSA infection     Obesity     Peripheral edema     right foot    Peripheral neuropathy     Right foot s/p tarsal tunnel release    Plantar fascial fibromatosis of right foot     PONV (postoperative nausea and vomiting)     requests Anti Emetic    RLS (restless legs syndrome)     Snores     Never has had sleep study    Tarsal tunnel syndrome, right     Vitamin D  deficiency     Wears glasses          Past Surgical History:   Procedure Laterality Date    HX APPENDECTOMY  childhood    HX EYE SURGERY Bilateral  Childhood    Muscle Surgery    HX FOOT SURGERY Right 06/07/2017    Tarsal Tunnel release; FASCIOTOMY PLANTAR    HX SHOULDER SURGERY Right 06/2016    Exc. Bone Spur, Repair Rotator Cuff Tear    HX TUBAL LIGATION  1992    KNEE SURGERY Left           Patient Active Problem List    Diagnosis    Insomnia    OSA (obstructive sleep apnea)    Type 2 diabetes mellitus with hyperglycemia    Left knee DJD    Chest pain, pleuritic    Acute bronchitis    Plantar fasciitis of right foot    Hypertension    Hyperlipidemia    Vitamin D  deficiency    CVA (cerebrovascular accident)    Anxiety    Depression    Obesity    Plantar fascial fibromatosis of right foot    Tarsal  tunnel syndrome of right side       MEDICATIONS:  Current Outpatient Medications   Medication Sig    amLODIPine  (NORVASC ) 10 mg Oral Tablet Take 1 Tablet (10 mg total) by mouth Once a day AMLODIPINE  BESYLATE    ergocalciferol , vitamin D2, (DRISDOL ) 1,250 mcg (50,000 unit) Oral Capsule Take 1 Capsule (50,000 Units total) by mouth Every 7 days for 90 days    lisinopriL -hydrochlorothiazide  (ZESTORETIC ) 20-12.5 mg Oral Tablet Take 1 Tablet by mouth Twice daily    semaglutide  (OZEMPIC ) 0.25 mg or 0.5 mg (2 mg/3 mL) Subcutaneous Pen Injector Inject 0.5 mg under the skin Every 7 days    simvastatin  (ZOCOR ) 20 mg Oral Tablet Take 1 Tablet (20 mg total) by mouth Every evening    traZODone  (DESYREL ) 50 mg Oral Tablet Take 1 Tablet (50 mg total) by mouth Every night for 90 days        ALLERGIES:   No Known Allergies     FAMILY HISTORY:  Family Medical History:       Problem Relation (Age of Onset)    Alzheimer's/Dementia Mother, Father    Arthritis-osteo Mother, Father    Blood Clots Father    Breast Cancer Maternal Aunt, Maternal Aunt    Congestive Heart Failure Father    Coronary Artery Disease Father    Diabetes Father    Heart Attack Mother, Father    High Cholesterol Mother, Father    Hypertension (High Blood Pressure) Mother, Father    Migraines Mother, Father    Sleep  disorders Father    Stroke Mother, Father              SOCIAL HISTORY:   reports that she has never smoked. She has never used smokeless tobacco. She reports that she does not drink alcohol  and does not use drugs.    PHYSICAL EXAM:  Ht 1.575 m (5\' 2" )   Wt 85.5 kg (188 lb 6.4 oz)   LMP  (LMP Unknown)   BMI 34.46 kg/m       GENERAL:  No acute distress; clean, appropriately dressed; well-nourished  HEENT: head normocephalic, atraumatic; nose w/out rhinorrhea/epistaxis  EYES: EOMI; vision intact   NECK: no JVD or obvious swelling  CARDIOVASCULAR: no pallor or cyanosis  RESPIRATORY:  non-labored breathing; symmetric chest wall rise bilat; no audible wheezing  ABDOMEN: no severe distention  NEURO:  Alert and oriented x3; conversant  EXTREMITIES: no diffuse obvious swelling  HEME: no diffuse obvious bruising or bleeding  PSYCH: normal affect and mood  MSK:  She ambulates with a antalgic gait.  She lacks full extension by about 20.  Flexion to about 110.  Global pain is noted.  Hyperextension attempts are very uncomfortable.  Sensor mechanism intact.  McMurray is equivocal.    IMAGING:   MRI suggest a root tear of the posterior lateral meniscus, without a great amount of osteoarthritis of the lateral compartment.  Images were personally reviewed and independently interpreted by me at today's visit.   Cloyde Daring, DO  05/17/2024, 14:10       ASSESSMENT/PLAN:    ICD-10-CM    1. Internal derangement of right knee  M23.91       2. Lateral meniscus, posterior horn derangement, right  M23.351         These longstanding symptoms are associated with a significant contracture of her right knee.  The root tear would best be fixed by Dr. Raenelle Bumpers, and with specialized techniques.  She will be referred  to him.  She understood the rationale for this.  Questions answered.  Cloyde Daring, DO  05/17/2024, 14:10    Portions of this note may be dictated using voice recognition software or a dictation service. Variances in spelling and  vocabulary are possible and unintentional. Not all errors are caught/corrected. Please notify the [facility] if any discrepancies are noted or if the meaning of any statement is not clear.

## 2024-06-04 ENCOUNTER — Other Ambulatory Visit (INDEPENDENT_AMBULATORY_CARE_PROVIDER_SITE_OTHER): Payer: Self-pay

## 2024-06-04 DIAGNOSIS — E119 Type 2 diabetes mellitus without complications: Secondary | ICD-10-CM

## 2024-06-05 MED ORDER — SEMAGLUTIDE 0.25 MG OR 0.5 MG (2 MG/3 ML) SUBCUTANEOUS PEN INJECTOR
0.5000 mg | PEN_INJECTOR | SUBCUTANEOUS | 1 refills | Status: DC
Start: 2024-06-05 — End: 2024-07-05

## 2024-06-26 ENCOUNTER — Other Ambulatory Visit: Payer: Self-pay

## 2024-06-26 NOTE — Progress Notes (Addendum)
 [facility] MEDICAL OFFICE BUILDING  [facility]  [address]  [place] [address]  Dept: [phone]  Dept Fax: [phone]  Loc: [phone]  Loc Fax: [phone]        HPI:   Toni Tran is a 68 y.o. female who presents for follow-up to clinic for right knee pain.  Patient was last seen 1 of my partners we will obtain an MRI and recommended follow-up with myself.  Since then patient states she continues to have significant limited range of motion as well as pain in the right knee.  She points to the inside portion of her knee and says it is very painful even to touch.  She denies any instability at this time but just generalized pain particularly with ambulation.  Patient does have a history of left total knee replacement    ROS:  GENERAL: No fever or chills  HEENT: no current nosebleed; no new onset visual disturbances  NECK: no swelling  CARDIOVASCULAR:  No severe chest pain  RESPIRATORY:  No severe SOA  ABDOMINAL: no severe abdominal pain  MSK: as per HPI  NEURO: no severe headache; as per HPI  SKIN: no current major rash  HEME: no major bruising or bleeding issues  EXTREMITIES: as per HPI  PSYCH: no delusions/hallucinations    PMH:   Past Medical History:   Diagnosis Date    Anxiety     Blood thinned due to long-term anticoagulant use     Aspirin  325mg . 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, right sided weakness    Depression     History of anesthesia complications     Required overnight stay after tarsal tunnel release d/t hypoxia per pt,     Hypercholesterolemia     Hyperlipidemia     Hypertension     MRSA infection     Obesity     Peripheral edema     right foot    Peripheral neuropathy     Right foot s/p tarsal tunnel release    Plantar fascial fibromatosis of right foot     PONV (postoperative nausea and vomiting)     requests Anti Emetic    RLS (restless legs syndrome)     Snores     Never  has had sleep study    Tarsal tunnel syndrome, right     Vitamin D  deficiency     Wears glasses          Past Surgical History:   Procedure Laterality Date    HX APPENDECTOMY  childhood    HX EYE SURGERY Bilateral Childhood    Muscle Surgery    HX FOOT SURGERY Right 06/07/2017    Tarsal Tunnel release; FASCIOTOMY PLANTAR    HX SHOULDER SURGERY Right 06/2016    Exc. Bone Spur, Repair Rotator Cuff Tear    HX TUBAL LIGATION  1992    KNEE SURGERY Left           Patient Active Problem List    Diagnosis    Insomnia    OSA (obstructive sleep apnea)    Type 2 diabetes mellitus with hyperglycemia    Left knee DJD    Chest pain, pleuritic    Acute bronchitis    Plantar fasciitis of right foot    Hypertension    Hyperlipidemia    Vitamin D  deficiency  CVA (cerebrovascular accident)    Anxiety    Depression    Obesity    Plantar fascial fibromatosis of right foot    Tarsal tunnel syndrome of right side       MEDICATIONS:  Current Outpatient Medications   Medication Sig    amLODIPine  (NORVASC ) 10 mg Oral Tablet Take 1 Tablet (10 mg total) by mouth Once a day AMLODIPINE  BESYLATE    ergocalciferol , vitamin D2, (DRISDOL ) 1,250 mcg (50,000 unit) Oral Capsule Take 1 Capsule (50,000 Units total) by mouth Every 7 days for 90 days    lisinopriL -hydrochlorothiazide  (ZESTORETIC ) 20-12.5 mg Oral Tablet Take 1 Tablet by mouth Twice daily    semaglutide  (OZEMPIC ) 0.25 mg or 0.5 mg (2 mg/3 mL) Subcutaneous Pen Injector Inject 0.5 mg under the skin Every 7 days    simvastatin  (ZOCOR ) 20 mg Oral Tablet Take 1 Tablet (20 mg total) by mouth Every evening    traZODone  (DESYREL ) 50 mg Oral Tablet Take 1 Tablet (50 mg total) by mouth Every night for 90 days        ALLERGIES:   Allergies[1]     FAMILY HISTORY:  Family Medical History:       Problem Relation (Age of Onset)    Alzheimer's/Dementia Mother, Father    Arthritis-osteo Mother, Father    Blood Clots Father    Breast Cancer Maternal Aunt, Maternal Aunt    Congestive Heart Failure Father     Coronary Artery Disease Father    Diabetes Father    Heart Attack Mother, Father    High Cholesterol Mother, Father    Hypertension (High Blood Pressure) Mother, Father    Migraines Mother, Father    Sleep disorders Father    Stroke Mother, Father              SOCIAL HISTORY:   reports that she has never smoked. She has never used smokeless tobacco. She reports that she does not drink alcohol  and does not use drugs.    PHYSICAL EXAM:  Ht 1.575 m (5' 2)   Wt 85.3 kg (188 lb)   LMP  (LMP Unknown)   BMI 34.39 kg/m       GENERAL:  No acute distress; clean, appropriately dressed; well-nourished  HEENT: head normocephalic, atraumatic; nose w/out rhinorrhea/epistaxis  EYES: EOMI; vision intact   NECK: no JVD or obvious swelling  CARDIOVASCULAR: no pallor or cyanosis  RESPIRATORY:  non-labored breathing; symmetric chest wall rise bilat; no audible wheezing  ABDOMEN: no severe distention  NEURO:  Alert and oriented x3; conversant  EXTREMITIES: no diffuse obvious swelling  HEME: no diffuse obvious bruising or bleeding  PSYCH: normal affect and mood  MSK:    RIGHT Knee   Appearance:  Skin is intact, no erythema or increased warmth compared to contralateral extremity  Fluid:  Trace  Motion:    Flexion:  90    Extension:  20   Meniscal Provocative Maneuvers:    Flexion circumduction:  Negative    McMurray's:  Negative   Ligamentous Exam:    ACL:      Anterior Drawer: stable     Lachman: stable     Pivot shift: stable    PCL:     Posterior Drawer: negative    MCL:     Valgus stress: <5 mm medial joint space widening, firm end point    LCL:     Varus Stress: <5 mm lateral joint space widening, firm end point   Tenderness  Medial joint line:  Maximum point of tenderness    Lateral joint line:  Nontender    Miscellaneous:  Diffuse tenderness along the anterior aspect of the knee as well as the medial side over the medial joint line   Neurovascular    Intact sensation DPN/SPN/Tibial nerve distribution, 2+ DP pulse, <2 sec  capillary refill      IMAGING:   Radiographs demonstrate bone-on-bone osteoarthritis of both the mediolateral facets the patellofemoral joint.  Ligaments appear to be intact.  There is mild degenerative changes along the lateral compartment chondromalacia particular on the tibial aspect this is also true along the medial compartment although to a less degree.  No obvious meniscus pathology present.  Images were personally reviewed and independently interpreted by me at today's visit.   Marsa Port, MD  06/26/2024, 14:42       ASSESSMENT/PLAN:    ICD-10-CM    1. Osteoarthritis of right knee, unspecified osteoarthritis type  M17.11         Patient does not appear to have any meniscal provocative maneuvers are positive also does not have any pain in the lateral compartment of the knee.  In addition the MRI he is not particularly conclusive regarding the presence of a lateral meniscus tear.  I do believe the majority of the patient's symptoms are stemming from arthritis where it is severe in the patellofemoral joint and mild-to-moderate in the medial compartment.  She is mostly hurting clinically over the medial joint line.  Thus I will treat her for her osteoarthritis using a cortisone injection which she tolerated well she will perform physical therapy exercises which will be critical in terms of normalizing her range of motion as it is fairly restricted from 20 to 90 today.  She will follow up with me in 3-6 months.    The patient and I discussed all the potential risks and complications associated with this RIGHT knee injection today.  All questions were answered.  The patient wanted to proceed forward with the injection.  The injection was performed under  sterile technique. The injection was placed into the knee joint using anterolateral approach The injection consisted of 3 cc of 1% Lidocaine  and 1 cc of Kenalog (40 milligrams/cc).  The patient tolerated this well. A Band-Aid was placed over the injection  site.     Marsa Port, MD 07/02/2024, 09:15          Marsa Port, MD  06/26/2024, 14:42    Portions of this note may be dictated using voice recognition software or a dictation service. Variances in spelling and vocabulary are possible and unintentional. Not all errors are caught/corrected. Please notify the dino if any discrepancies are noted or if the meaning of any statement is not clear.             [1] No Known Allergies

## 2024-06-27 ENCOUNTER — Ambulatory Visit (HOSPITAL_BASED_OUTPATIENT_CLINIC_OR_DEPARTMENT_OTHER): Payer: Self-pay | Admitting: Orthopaedic Surgery

## 2024-06-27 ENCOUNTER — Other Ambulatory Visit: Payer: Self-pay

## 2024-06-27 VITALS — Ht 62.0 in | Wt 188.0 lb

## 2024-06-27 DIAGNOSIS — M1711 Unilateral primary osteoarthritis, right knee: Secondary | ICD-10-CM

## 2024-06-27 MED ORDER — LIDOCAINE HCL 10 MG/ML (1 %) INJECTION SOLUTION
3.0000 mL | INTRAMUSCULAR | Status: AC
Start: 2024-06-27 — End: 2024-06-27
  Administered 2024-06-27: 30 mg via INTRAMUSCULAR

## 2024-06-27 MED ORDER — METHYLPREDNISOLONE ACETATE 40 MG/ML SUSPENSION FOR INJECTION
40.0000 mg | INTRAMUSCULAR | Status: AC
Start: 2024-06-27 — End: 2024-06-27
  Administered 2024-06-27: 40 mg via INTRAMUSCULAR

## 2024-07-02 ENCOUNTER — Other Ambulatory Visit (INDEPENDENT_AMBULATORY_CARE_PROVIDER_SITE_OTHER): Payer: Self-pay

## 2024-07-02 DIAGNOSIS — G4700 Insomnia, unspecified: Secondary | ICD-10-CM

## 2024-07-04 ENCOUNTER — Other Ambulatory Visit (INDEPENDENT_AMBULATORY_CARE_PROVIDER_SITE_OTHER): Payer: Self-pay

## 2024-07-04 DIAGNOSIS — E119 Type 2 diabetes mellitus without complications: Secondary | ICD-10-CM

## 2024-07-05 ENCOUNTER — Other Ambulatory Visit (HOSPITAL_COMMUNITY): Payer: Self-pay

## 2024-07-05 DIAGNOSIS — E119 Type 2 diabetes mellitus without complications: Secondary | ICD-10-CM

## 2024-07-05 MED ORDER — SEMAGLUTIDE 0.25 MG OR 0.5 MG (2 MG/3 ML) SUBCUTANEOUS PEN INJECTOR
0.5000 mg | PEN_INJECTOR | SUBCUTANEOUS | 3 refills | Status: DC
Start: 2024-07-05 — End: 2024-10-03

## 2024-07-05 NOTE — Progress Notes (Signed)
 Population Health    Reason for Encounter: Medication Adherence Quality Review    I reviewed Toni Tran chart and medication dispensing report for compliance with Ozempic  and upon review the patient may benefit from the conversion to an extended day supply of their maintenance medications. Please review the pended prescriptions for appropriateness. Thank you for everything that you do for your patients and for your collaboration. Please feel free to reach out with any questions, concerns or assistance that I can provide.      Thanks!  Arland Courts, RN

## 2024-07-06 ENCOUNTER — Ambulatory Visit

## 2024-07-06 ENCOUNTER — Other Ambulatory Visit: Payer: Self-pay

## 2024-07-06 DIAGNOSIS — E559 Vitamin D deficiency, unspecified: Secondary | ICD-10-CM | POA: Insufficient documentation

## 2024-07-06 LAB — VITAMIN D 25 TOTAL: VITAMIN D 25, TOTAL: 21.5 ng/mL (ref 20.0–100.0)

## 2024-07-09 ENCOUNTER — Ambulatory Visit (INDEPENDENT_AMBULATORY_CARE_PROVIDER_SITE_OTHER): Payer: Self-pay

## 2024-07-20 ENCOUNTER — Other Ambulatory Visit (INDEPENDENT_AMBULATORY_CARE_PROVIDER_SITE_OTHER): Payer: Self-pay

## 2024-07-20 DIAGNOSIS — E785 Hyperlipidemia, unspecified: Secondary | ICD-10-CM

## 2024-07-20 DIAGNOSIS — I10 Essential (primary) hypertension: Secondary | ICD-10-CM

## 2024-07-23 MED ORDER — LISINOPRIL 20 MG-HYDROCHLOROTHIAZIDE 12.5 MG TABLET
1.0000 | ORAL_TABLET | Freq: Two times a day (BID) | ORAL | 1 refills | Status: DC
Start: 2024-07-23 — End: 2024-10-09

## 2024-07-23 MED ORDER — AMLODIPINE 10 MG TABLET
10.0000 mg | ORAL_TABLET | Freq: Every day | ORAL | 1 refills | Status: DC
Start: 2024-07-23 — End: 2024-10-09

## 2024-07-23 MED ORDER — SIMVASTATIN 20 MG TABLET
20.0000 mg | ORAL_TABLET | Freq: Every evening | ORAL | 1 refills | Status: DC
Start: 2024-07-23 — End: 2024-10-09

## 2024-10-01 ENCOUNTER — Other Ambulatory Visit (INDEPENDENT_AMBULATORY_CARE_PROVIDER_SITE_OTHER): Payer: Self-pay

## 2024-10-01 DIAGNOSIS — G4700 Insomnia, unspecified: Secondary | ICD-10-CM

## 2024-10-03 ENCOUNTER — Other Ambulatory Visit (INDEPENDENT_AMBULATORY_CARE_PROVIDER_SITE_OTHER): Payer: Self-pay

## 2024-10-03 DIAGNOSIS — E119 Type 2 diabetes mellitus without complications: Secondary | ICD-10-CM

## 2024-10-03 MED ORDER — SEMAGLUTIDE 0.25 MG OR 0.5 MG (2 MG/3 ML) SUBCUTANEOUS PEN INJECTOR: 0.5000 mg | PEN_INJECTOR | SUBCUTANEOUS | 3 refills | Status: DC

## 2024-10-04 ENCOUNTER — Other Ambulatory Visit (INDEPENDENT_AMBULATORY_CARE_PROVIDER_SITE_OTHER): Payer: Self-pay

## 2024-10-04 DIAGNOSIS — E785 Hyperlipidemia, unspecified: Secondary | ICD-10-CM

## 2024-10-04 DIAGNOSIS — I10 Essential (primary) hypertension: Secondary | ICD-10-CM

## 2024-10-05 NOTE — Telephone Encounter (Signed)
 Patient still has refills at pharmacy.  Dickey Leaven, [place]

## 2024-10-08 ENCOUNTER — Other Ambulatory Visit: Payer: Self-pay

## 2024-10-09 ENCOUNTER — Other Ambulatory Visit: Payer: Self-pay

## 2024-10-09 ENCOUNTER — Encounter (INDEPENDENT_AMBULATORY_CARE_PROVIDER_SITE_OTHER): Payer: Self-pay

## 2024-10-09 ENCOUNTER — Ambulatory Visit: Payer: Self-pay

## 2024-10-09 VITALS — BP 135/81 | HR 87 | Temp 98.4°F | Resp 16 | Ht 62.25 in | Wt 187.8 lb

## 2024-10-09 DIAGNOSIS — Z8673 Personal history of transient ischemic attack (TIA), and cerebral infarction without residual deficits: Secondary | ICD-10-CM

## 2024-10-09 DIAGNOSIS — Z23 Encounter for immunization: Secondary | ICD-10-CM | POA: Insufficient documentation

## 2024-10-09 DIAGNOSIS — Z91199 Patient's noncompliance with other medical treatment and regimen due to unspecified reason: Secondary | ICD-10-CM

## 2024-10-09 DIAGNOSIS — Z6834 Body mass index (BMI) 34.0-34.9, adult: Secondary | ICD-10-CM

## 2024-10-09 DIAGNOSIS — E669 Obesity, unspecified: Secondary | ICD-10-CM | POA: Insufficient documentation

## 2024-10-09 DIAGNOSIS — I10 Essential (primary) hypertension: Secondary | ICD-10-CM | POA: Insufficient documentation

## 2024-10-09 DIAGNOSIS — E1169 Type 2 diabetes mellitus with other specified complication: Secondary | ICD-10-CM

## 2024-10-09 DIAGNOSIS — M8580 Other specified disorders of bone density and structure, unspecified site: Secondary | ICD-10-CM | POA: Insufficient documentation

## 2024-10-09 DIAGNOSIS — M7989 Other specified soft tissue disorders: Secondary | ICD-10-CM | POA: Insufficient documentation

## 2024-10-09 DIAGNOSIS — G4700 Insomnia, unspecified: Secondary | ICD-10-CM | POA: Insufficient documentation

## 2024-10-09 DIAGNOSIS — I639 Cerebral infarction, unspecified: Secondary | ICD-10-CM | POA: Insufficient documentation

## 2024-10-09 DIAGNOSIS — E119 Type 2 diabetes mellitus without complications: Secondary | ICD-10-CM | POA: Insufficient documentation

## 2024-10-09 DIAGNOSIS — Z1231 Encounter for screening mammogram for malignant neoplasm of breast: Secondary | ICD-10-CM | POA: Insufficient documentation

## 2024-10-09 DIAGNOSIS — F32A Depression, unspecified: Secondary | ICD-10-CM | POA: Insufficient documentation

## 2024-10-09 DIAGNOSIS — E785 Hyperlipidemia, unspecified: Secondary | ICD-10-CM | POA: Insufficient documentation

## 2024-10-09 DIAGNOSIS — F419 Anxiety disorder, unspecified: Secondary | ICD-10-CM | POA: Insufficient documentation

## 2024-10-09 DIAGNOSIS — Z7985 Long-term (current) use of injectable non-insulin antidiabetic drugs: Secondary | ICD-10-CM

## 2024-10-09 DIAGNOSIS — E1165 Type 2 diabetes mellitus with hyperglycemia: Secondary | ICD-10-CM | POA: Insufficient documentation

## 2024-10-09 DIAGNOSIS — G4733 Obstructive sleep apnea (adult) (pediatric): Secondary | ICD-10-CM | POA: Insufficient documentation

## 2024-10-09 DIAGNOSIS — E559 Vitamin D deficiency, unspecified: Secondary | ICD-10-CM | POA: Insufficient documentation

## 2024-10-09 LAB — MICROALBUMIN/CREATININE RATIO, URINE, RANDOM
CREATININE, UR RAND: 300
MICROALBUMIN RANDOM URINE: 10
MICROALBUMIN/CREAT RATIO: <30

## 2024-10-09 LAB — A1C (POINT OF CARE): POCT HGB A1C: 5 % (ref 4–6)

## 2024-10-09 MED ORDER — LISINOPRIL 20 MG-HYDROCHLOROTHIAZIDE 12.5 MG TABLET: 1.0000 | ORAL_TABLET | Freq: Two times a day (BID) | ORAL | 1 refills | Status: DC

## 2024-10-09 MED ORDER — SIMVASTATIN 20 MG TABLET: 20.0000 mg | ORAL_TABLET | Freq: Every evening | ORAL | 4 refills | Status: DC

## 2024-10-09 MED ORDER — TRAZODONE 100 MG TABLET: 100.0000 mg | ORAL_TABLET | Freq: Every evening | ORAL | 0 refills | Status: DC

## 2024-10-09 MED ORDER — COVID VACC 2025-2026 (12 YRS UP) (PFIZER)(PF) 30 MCG/0.3 ML IM SYRINGE
30.0000 ug | INJECTION | Freq: Once | INTRAMUSCULAR | 0 refills | Status: DC
Start: 2024-10-09 — End: 2024-11-06

## 2024-10-09 MED ORDER — AMLODIPINE 10 MG TABLET: 10.0000 mg | ORAL_TABLET | Freq: Every day | ORAL | 1 refills | Status: DC

## 2024-10-09 NOTE — Nursing Note (Signed)
 10/09/24 1344   Depression Screen   Little interest or pleasure in doing things. 0   Feeling down, depressed, or hopeless 0   PHQ 2 Total 0
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
## 2024-10-09 NOTE — Progress Notes (Signed)
 FAMILY MEDICINE, [facility] FAMILY MEDICINE  [address]  [place] [address]  Operated by [facility]    Encounter Date: 10/09/2024      Patient ID:  Toni Tran   MRN: [id number]    DOB: 1956/04/08   Age: 68 y.o.      Subjective:     Chief Complaint              Follow Up 6 Months     Diabetes Follow up Vaccinations  Seeing ortho for knee problems    Knee Pain     Medication Refill     Referral Referral for derm             HPI:  History of Present Illness  Toni Tran is a 68 year old female who presents for chronic management. PMH of hyperlipidemia, hypertension, OSA, CVA, DM type II, vitamin d  deficiency, osteopenia, depression, anxiety, insomnia, and obesity.    She is still struggling with her sleep. Taking trazodone  but not noting a significant difference yet. She has not followed with pulmonology for OSA.    She has a soft tissue growth to right posterior medial forearm present for about two years, initially small but recently increased in size over the past few months. It is painful with a sensation of 'pins and needles' when touched. She has not experienced similar growths elsewhere.    She denies chest pain, SOB, nausea, vomiting, or diarrhea.       Results  LABS 07/06/24: vitamin D  21.5     Current Outpatient Medications   Medication Sig    amLODIPine  (NORVASC ) 10 mg Oral Tablet Take 1 Tablet (10 mg total) by mouth Daily AMLODIPINE  BESYLATE    Benzonatate  (TESSALON ) 200 mg Oral Capsule Take 1 Capsule (200 mg total) by mouth Three times a day as needed for Cough    lisinopriL -hydrochlorothiazide  (ZESTORETIC ) 20-12.5 mg Oral Tablet Take 1 Tablet by mouth Twice daily    semaglutide  (OZEMPIC ) 0.25 mg or 0.5 mg (2 mg/3 mL) Subcutaneous Pen Injector Inject 0.5 mg under the skin Every 7 days    simvastatin  (ZOCOR ) 20 mg Oral Tablet Take 1 Tablet (20 mg total) by mouth Every evening    traZODone  (DESYREL ) 100 mg Oral Tablet Take 1 Tablet (100 mg total) by mouth Every night        Allergies[1]   Past Medical History:   Diagnosis Date    Anxiety     CVA (cerebrovascular accident)     ischemic CVA x 2 10/2010 and 01/2012 - residual tremor right hand, right sided weakness    Depression     Heart murmur     History of anesthesia complications     Required overnight stay after tarsal tunnel release d/t hypoxia per pt,     History of myocardial perfusion scan 09/05/2023    wnl    Hx of echocardiogram 08/26/2023    The left ventricular ejection fraction by visual assessment is estimated to be 60-65%.  No significant valvular heart disease.    Hypercholesterolemia     Hyperlipidemia     Hypertension     Mass of arm, right 10/18/2024    MRSA infection 2021    Obesity     Peripheral edema     right foot comes and goes    Peripheral neuropathy     Right foot s/p tarsal tunnel release    Plantar fascial fibromatosis of right foot     PONV (postoperative nausea  and vomiting)     requests Anti Emetic    RBBB     RLS (restless legs syndrome)     Sleep apnea     doesn't tolerate cpap I feel like I'm suffocating    Tarsal tunnel syndrome, right     Type 2 diabetes mellitus     Vitamin D  deficiency     Wears glasses       Past Surgical History:   Procedure Laterality Date    ACHILLES TENDON REPAIR Right 11/15/2018    Achilles tendon debridement and repair, right    HX APPENDECTOMY  childhood    HX EYE SURGERY Bilateral Childhood    Muscle Surgery    HX FOOT SURGERY Right 06/07/2017    Tarsal Tunnel release; FASCIOTOMY PLANTAR    HX ROTATOR CUFF REPAIR Right 06/2016    with Bone Spur excision    HX TUBAL LIGATION  1992    REPLACEMENT TOTAL KNEE Left 05/06/2020      Family Medical History:       Problem Relation (Age of Onset)    Alzheimer's/Dementia Mother, Father    Arthritis-osteo Mother, Father    Blood Clots Father    Breast Cancer Maternal Aunt, Maternal Aunt    Congestive Heart Failure Father    Coronary Artery Disease Father    Diabetes Father    Heart Attack Mother, Father    High Cholesterol  Mother, Father    Hypertension (High Blood Pressure) Mother, Father    Migraines Mother, Father    Sleep disorders Father    Stroke Mother, Father            Social History     Socioeconomic History    Marital status: Single   Tobacco Use    Smoking status: Never    Smokeless tobacco: Never   Vaping Use    Vaping status: Never Used   Substance and Sexual Activity    Alcohol  use: No    Drug use: No     Social Determinants of Health     Health Literacy: Low Risk (10/20/2023)    Health Literacy     SDOH Health Literacy: Never      ROS   All pertinent review of systems both positive and negative as per HPI.      Objective:   Vitals: BP 135/81 (Site: Left Arm, Patient Position: Sitting)   Pulse 87   Temp 36.9 C (98.4 F) (Thermal Scan)   Resp 16   Ht 1.581 m (5' 2.25)   Wt 85.2 kg (187 lb 12.8 oz)   LMP  (LMP Unknown)   SpO2 96%   BMI 34.07 kg/m         Physical Exam  Vitals and nursing note reviewed.   Constitutional:       Appearance: She is well-developed.   HENT:      Head: Normocephalic and atraumatic.   Eyes:      Conjunctiva/sclera: Conjunctivae normal.   Neck:      Vascular: No JVD.   Cardiovascular:      Rate and Rhythm: Normal rate and regular rhythm.      Heart sounds: No murmur heard.     No friction rub. No gallop.   Pulmonary:      Effort: Pulmonary effort is normal. No respiratory distress.      Breath sounds: No stridor. No wheezing, rhonchi or rales.   Abdominal:      General: There is no distension.  Palpations: Abdomen is soft.   Musculoskeletal:      Cervical back: Normal range of motion.      Comments: Growth posterior medial right forearm    Skin:     General: Skin is warm and dry.   Neurological:      Mental Status: She is alert and oriented to person, place, and time.   Psychiatric:         Mood and Affect: Mood normal.         Behavior: Behavior normal.         Thought Content: Thought content normal.         Judgment: Judgment normal.          Assessment & Plan:     ENCOUNTER  DIAGNOSES     ICD-10-CM   1. Diabetes mellitus, type II  E11.9   2. Hyperlipidemia, unspecified hyperlipidemia type  E78.5   3. Primary hypertension  I10   4. OSA (obstructive sleep apnea)  G47.33   5. Cerebrovascular accident (CVA), unspecified mechanism (CMS HCC)  I63.9   6. Vitamin D  deficiency  E55.9   7. Osteopenia, unspecified location  M85.80   8. Anxiety  F41.9   9. Depression, unspecified depression type  F32.A   10. Insomnia, unspecified type  G47.00   11. Obesity, unspecified class, unspecified obesity type, unspecified whether serious comorbidity present  E66.9   12. Nodule of soft tissue  M79.89   13. Need for immunization against influenza  Z23   14. Need for vaccination  Z23   15. Breast cancer screening by mammogram  Z12.31      Orders Placed This Encounter    MAMMO BILATERAL SCREENING-ADDL VIEWS/BREAST US  AS REQ BY RAD    US  SUPERFICIAL SOFT TISSUE(NON BREAST)    CANCELED: FLUAD (65+) Flu Vaccine,0.35mL IM (Admin)    CANCELED: Pneumovax (PPSV 23) (Admin)    FLUZONE  HD (65+ HIGH DOSE) Flu vaccine,0.5 mL IM (Admin)    Pneumovax (PPSV 23) (Admin)    MICROALBUMIN/CREATININE RATIO, URINE, RANDOM    COMPREHENSIVE METABOLIC PNL, FASTING    LIPID PANEL    VITAMIN D  25 TOTAL    Refer to [facility]    POCT EAST HGB A1C (AMB)    POCT Urine Microalb/Cr Ratio    A1C (POINT OF CARE)    simvastatin  (ZOCOR ) 20 mg Oral Tablet    amLODIPine  (NORVASC ) 10 mg Oral Tablet    lisinopriL -hydrochlorothiazide  (ZESTORETIC ) 20-12.5 mg Oral Tablet    traZODone  (DESYREL ) 100 mg Oral Tablet    COVID 25-26 vaccine, (PFIZER) (83yr and older) 30 mcg/0.3 mL IM injection      This is a 68 y.o. female here with     Assessment & Plan    Diabetes type II/hyperlipidemia   -A1C 5.0 in office; previous was 5.1  -Continue Ozempic  0.5 mg subcutaneous once q.7 days   -Continue Zocor  20 mg p.o. q.pm  -Microalbumin urine WNL.     Hypertension, controlled  -Continue Norvasc  10 mg p.o. q.d   -Continue  Zestoretic  20-12.5 mg p.o. b.i.d  -CMP     OSA  -Not currently wearing CPAP  -Previously referred to pulmonology - readdress at next appt. Suspect sleep issues due to OSA     Hx of CVA  -Emphasize importance of control of HTN and DM  -Continue Aspirin  81 mg p.o. q.d   -Continue Norvasc  10 mg p.o. q.d   -Continue Zestoretic  20-12.5 mg p.o. b.i.d  -Continue Zocor   20 mg p.o q.pm      Vitamin D  deficiency/osteopenia   -Continue high dose vitamin D  50,000 units p.o. once weekly   -Repeat vitamin D  level in 3 months      Depression/anxiety/insomnia   -Increase Trazodone  to 100 mg p.o. q.pm   -Readdress OSA/CPAP     Obesity  -Lifestyle modifications/Ozempic  as above     Soft tissue nodule, right forearm  -US  soft tissue  -Refer to general surgery     Health maintenance  -CMP  -Lipid panel   -Mammogram  -Flu vaccine and PPSV 23 administered   -COVID vaccine sent to pharmacy        Follow-up in 3 months for chronic management/A1c/insomnia/OSA              Marijo Danes, PA-C        Attestation    This note was created using Abridge mobile application via conversational audio. Consent for audio recording was obtained by patient/family members prior to recording.             [1] No Known Allergies

## 2024-10-09 NOTE — Nursing Note (Signed)
 10/09/24 1343   Fall Risk Assessment   Do you feel unsteady when standing or walking? No   Do you worry about falling? No   Have you fallen in the past year? Yes   How many times have you fallen? Once   Were you ever injured from falling? Yes   Timed up and go test (in seconds) 20

## 2024-10-09 NOTE — Nursing Note (Signed)
 BP 135/81 (Site: Left Arm, Patient Position: Sitting)   Pulse 87   Temp 36.9 C (98.4 F) (Thermal Scan)   Resp 16   Ht 1.581 m (5' 2.25)   Wt 85.2 kg (187 lb 12.8 oz)   LMP  (LMP Unknown)   SpO2 96%   BMI 34.07 kg/m   Dickey Leaven, [place]

## 2024-10-09 NOTE — Nursing Note (Signed)
 10/09/24 1344   GAD-2   Feeling nervous,anxious,on edge 0   Not being able to stop or control worrying 0   GAD -2 Score   GAD-2 Score 0

## 2024-10-15 ENCOUNTER — Other Ambulatory Visit: Payer: Self-pay

## 2024-10-16 ENCOUNTER — Ambulatory Visit (INDEPENDENT_AMBULATORY_CARE_PROVIDER_SITE_OTHER): Admitting: Family Medicine

## 2024-10-16 ENCOUNTER — Encounter (INDEPENDENT_AMBULATORY_CARE_PROVIDER_SITE_OTHER): Payer: Self-pay | Admitting: Family Medicine

## 2024-10-16 ENCOUNTER — Ambulatory Visit
Admission: RE | Admit: 2024-10-16 | Discharge: 2024-10-16 | Disposition: A | Discharge status: Home / Self Care | Source: Ambulatory Visit

## 2024-10-16 VITALS — BP 125/75 | HR 84 | Temp 98.5°F | Resp 18 | Wt 185.0 lb

## 2024-10-16 DIAGNOSIS — R2231 Localized swelling, mass and lump, right upper limb: Secondary | ICD-10-CM

## 2024-10-16 DIAGNOSIS — J069 Acute upper respiratory infection, unspecified: Secondary | ICD-10-CM

## 2024-10-16 DIAGNOSIS — R059 Cough, unspecified: Secondary | ICD-10-CM

## 2024-10-16 DIAGNOSIS — M7989 Other specified soft tissue disorders: Secondary | ICD-10-CM | POA: Insufficient documentation

## 2024-10-16 LAB — POC COVID-19, FLU A/B, RSV RAPID BY PCR (RESULTS)
INFLUENZA VIRUS A, PCR 4PLEX, POC: NEGATIVE
INFLUENZA VIRUS B, PCR 4PLEX, POC: NEGATIVE
RSV, PCR 4PLEX, POC: NEGATIVE
SARS-COV-2, POC: NEGATIVE

## 2024-10-16 MED ORDER — BENZONATATE 200 MG CAPSULE: 200.0000 mg | ORAL_CAPSULE | Freq: Three times a day (TID) | ORAL | 0 refills | Status: DC | PRN

## 2024-10-16 MED ORDER — PREDNISONE 50 MG TABLET
50.0000 mg | ORAL_TABLET | Freq: Every day | ORAL | 0 refills | Status: DC
Start: 2024-10-16 — End: 2024-10-25

## 2024-10-16 NOTE — Progress Notes (Signed)
 URGENT CARE, [facility]  [address]  [place] [address]  Office Visit    ID: Toni Tran   DOB: Sep 13, 1956  Date of Service: 10/16/2024     Chief Complaint(s):   Chief Complaint   Patient presents with    Cold Symptoms       SUBJECTIVE:  History of Present Illness  Toni Tran is a 68 year old female with diabetes who presents with congestion, body aches, and difficulty breathing. She is accompanied by her granddaughter, Izetta.    She has been experiencing significant congestion, body aches, and difficulty breathing for the past three days. The symptoms are severe, and she describes feeling so achy she could 'lay on your floor and just lay here for a while.'    During this period, she also experienced diarrhea. The symptoms began after her brother had a cold, which was passed around the household, affecting her and her granddaughter, who has bronchitis.    She has no history of asthma or COPD. She reports having diabetes and states that her sugar has been good.       ROS:  Constitutional: Denies fevers, chills, night sweats. No recent weight changes or fatigue.   Eyes: Denies change in vision. No irritation, erythema, discharge or pain.   Ears: Denies any difficulty with hearing. No ear pain, tinnitus or discharge.   Mouth/Throat: Denies any oral ulcers or other lesions. No sore throat, hoarseness or dysphagia.  Cardiovascular: Denies any chest pain, palpitations, PND or DOE  Respiratory: + cough  GI: Denies any abdominal pain, nausea, vomiting, diarrhea or constipation. No melena or hematochezia.  GU: Denies any dysuria, frequency, hematuria, hesitancy. Denies nocturia or incontinence.  Skin: Denies any recent rashes or lesions.     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, right sided weakness      Anxiety     Depression     Obesity     Plantar fascial fibromatosis of right foot     Tarsal tunnel syndrome of right side        Medications:  Current Outpatient Medications   Medication Sig    amLODIPine  (NORVASC ) 10 mg Oral Tablet Take 1 Tablet (10 mg total) by mouth Daily AMLODIPINE  BESYLATE    Benzonatate  (TESSALON ) 200 mg Oral Capsule Take 1 Capsule (200 mg total) by mouth Three times a day as needed for Cough    lisinopriL -hydrochlorothiazide  (ZESTORETIC ) 20-12.5 mg Oral Tablet Take 1 Tablet by mouth Twice daily    predniSONE  (DELTASONE ) 50 mg Oral Tablet Take 1 Tablet (50 mg total) by mouth Daily for 4 days    semaglutide  (OZEMPIC ) 0.25 mg or 0.5 mg (2 mg/3 mL) Subcutaneous Pen Injector Inject 0.5 mg under the skin Every 7 days    simvastatin  (ZOCOR ) 20 mg Oral Tablet Take 1 Tablet (20 mg total) by mouth Every evening    traZODone  (DESYREL ) 100 mg Oral Tablet Take 1 Tablet (  100 mg total) by mouth Every night        Allergies:  Allergies[1]     Social History:  Social History[2]       OBJECTIVE:  BP 125/75   Pulse 84   Temp 36.9 C (98.5 F) (Tympanic)   Resp 18   Wt 83.9 kg (185 lb)   LMP  (LMP Unknown)   SpO2 99%   BMI 33.57 kg/m            Results           Physical Exam:    GEN: Healthy appearing, well-developed, NAD.  PSYCH: Good Judgment. AOx3. Normal memory, mood, and affect.  HEENT:  -Head: NC/AT;  -Eyes: No discharge or redness;  -Ears: External ears are normal.  -Nose: Normal nares.  -Mouth and throat: MMM. Normal gums, mucosa, palate,. Good dentition.  CV: RRR, no m/r/g.  LUNGS: mild expiratory wheezing bilaterally   ABD: Soft, NT/ND, NBS, no masses or organomegaly.  GU: N/A  SKIN: Warm, well perfused. No skin rashes or abnormal lesions.  MSK:  Normal gait. No deformities.  EXT: No clubbing, cyanosis, or edema.  NEURO: Ambulating with no limitations. No focal deficits.       ASSESSMENT and PLAN:  Assessment & Plan  Acute viral upper respiratory infection with wheezing  Acute viral upper respiratory infection with wheezing, likely due to a circulating virus. Symptoms include congestion, myalgia, diarrhea, and dyspnea. Wheezing present on examination. Negative for flu, COVID, and RSV based on current trends, suggesting another viral etiology. Steroids considered to alleviate wheezing, with caution due to potential impact on blood glucose levels.  - Prescribed steroids to reduce wheezing  - Prescribed medication for cough  - Sent prescriptions to pharmacy    Type 2 diabetes mellitus  Type 2 diabetes mellitus, currently well-controlled. Steroid treatment may cause transient hyperglycemia, necessitating monitoring of blood glucose levels.  - Monitor blood glucose levels closely during steroid treatment                       Bernardino Pinal, DO 10/16/2024, 19:03        Visit Diagnoses and associated Orders -- Summary:        ICD-10-CM    1. Viral URI with cough  J06.9       2. Cough in adult  R05.9             Portions of this note may be dictated using voice recognition software.   Variances in spelling and vocabulary are possible and unintentional. Not all errors are caught/corrected. Please notify the dino if any discrepancies are noted or if the meaning of any statement is not clear.     This note was created with assistance from Abridge via capture of conversational audio.  Consent was obtained from the patient prior to recording.           [1] No Known Allergies  [2]   Social History  Tobacco Use    Smoking status: Never    Smokeless tobacco: Never   Vaping Use    Vaping status: Never Used   Substance Use Topics    Alcohol  use: No    Drug use: No

## 2024-10-16 NOTE — Nursing Note (Signed)
 BP 125/75   Pulse 84   Temp 36.9 C (98.5 F) (Tympanic)   Resp 18   Wt 83.9 kg (185 lb)   LMP  (LMP Unknown)   SpO2 99%   BMI 33.57 kg/m   Toni Tran [place], [place]

## 2024-10-17 ENCOUNTER — Telehealth (INDEPENDENT_AMBULATORY_CARE_PROVIDER_SITE_OTHER): Payer: Self-pay

## 2024-10-17 ENCOUNTER — Ambulatory Visit (INDEPENDENT_AMBULATORY_CARE_PROVIDER_SITE_OTHER): Payer: Self-pay

## 2024-10-18 ENCOUNTER — Encounter (HOSPITAL_COMMUNITY): Payer: Self-pay | Admitting: GENERAL SURGERY

## 2024-10-18 ENCOUNTER — Ambulatory Visit: Payer: Self-pay | Attending: GENERAL SURGERY | Admitting: GENERAL SURGERY

## 2024-10-18 ENCOUNTER — Other Ambulatory Visit: Payer: Self-pay

## 2024-10-18 VITALS — BP 129/73 | HR 81 | Temp 96.9°F | Ht 62.0 in | Wt 188.8 lb

## 2024-10-18 DIAGNOSIS — Z01818 Encounter for other preprocedural examination: Secondary | ICD-10-CM | POA: Insufficient documentation

## 2024-10-18 DIAGNOSIS — M7989 Other specified soft tissue disorders: Secondary | ICD-10-CM | POA: Insufficient documentation

## 2024-10-18 DIAGNOSIS — R2231 Localized swelling, mass and lump, right upper limb: Secondary | ICD-10-CM | POA: Insufficient documentation

## 2024-10-18 NOTE — H&P (Signed)
 [facility]  [address]  [place] [address]  Operated by [facility]       On the date of this encounter, a total of 40 minutes was spent on this patient encounter including review of historical information, examination, documentation and post-visit activities. This time documented excludes any procedural time.    Chaperone declined by patient.    Surgical History and Physical      Name: Toni Tran MRN:  [id number]   Date: 10/18/2024 DOB:  December 06, 1956 (68 y.o.)     History:  CC: soft tissue mass right arm about 8 x 4 cm soft and mobile with respect to the fascia but somewhat adherent to the skin with slight dimpling effect. C/O tingling sensation like ants crawling when palpated. New change in size x 100% over the last three months although was stable about 2-3 cm one year ago and then six months ago was 4 cm.    HPI: No hx skin cancer but had benign scalp cysts removed by dermatology. No hx of lipomas.     Past Medical History:   Diagnosis Date    Anxiety     Blood thinned due to long-term anticoagulant use     Aspirin  325mg . 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, right sided weakness    Depression     History of anesthesia complications     Required overnight stay after tarsal tunnel release d/t hypoxia per pt,     Hypercholesterolemia     Hyperlipidemia     Hypertension     Mass of arm, right 10/18/2024    MRSA infection     Obesity     Peripheral edema     right foot    Peripheral neuropathy     Right foot s/p tarsal tunnel release    Plantar fascial fibromatosis of right foot     PONV (postoperative nausea and vomiting)     requests Anti Emetic    RLS (restless legs syndrome)     Snores     Never has had sleep study    Tarsal tunnel syndrome, right     Vitamin D  deficiency     Wears glasses          Past Surgical History:   Procedure Laterality Date    HX  APPENDECTOMY  childhood    HX EYE SURGERY Bilateral Childhood    Muscle Surgery    HX FOOT SURGERY Right 06/07/2017    Tarsal Tunnel release; FASCIOTOMY PLANTAR    HX SHOULDER SURGERY Right 06/2016    Exc. Bone Spur, Repair Rotator Cuff Tear    HX TUBAL LIGATION  1992    KNEE SURGERY Left          Family Medical History:       Problem Relation (Age of Onset)    Alzheimer's/Dementia Mother, Father    Arthritis-osteo Mother, Father    Blood Clots Father    Breast Cancer Maternal Aunt, Maternal Aunt    Congestive Heart Failure Father    Coronary Artery Disease Father    Diabetes Father    Heart Attack Mother, Father    High Cholesterol Mother, Father    Hypertension (High Blood Pressure) Mother, Father    Migraines Mother, Father    Sleep disorders Father    Stroke Mother,  Father            amLODIPine  (NORVASC ) 10 mg Oral Tablet, Take 1 Tablet (10 mg total) by mouth Daily AMLODIPINE  BESYLATE  Benzonatate  (TESSALON ) 200 mg Oral Capsule, Take 1 Capsule (200 mg total) by mouth Three times a day as needed for Cough  lisinopriL -hydrochlorothiazide  (ZESTORETIC ) 20-12.5 mg Oral Tablet, Take 1 Tablet by mouth Twice daily  predniSONE  (DELTASONE ) 50 mg Oral Tablet, Take 1 Tablet (50 mg total) by mouth Daily for 4 days  semaglutide  (OZEMPIC ) 0.25 mg or 0.5 mg (2 mg/3 mL) Subcutaneous Pen Injector, Inject 0.5 mg under the skin Every 7 days  simvastatin  (ZOCOR ) 20 mg Oral Tablet, Take 1 Tablet (20 mg total) by mouth Every evening  traZODone  (DESYREL ) 100 mg Oral Tablet, Take 1 Tablet (100 mg total) by mouth Every night    No facility-administered medications prior to visit.    Allergies[1]  Social History     Socioeconomic History    Marital status: Single     Spouse name: Not on file    Number of children: Not on file    Years of education: Not on file    Highest education level: Not on file   Occupational History    Not on file   Tobacco Use    Smoking status: Never    Smokeless tobacco: Never   Vaping Use    Vaping status: Never  Used   Substance and Sexual Activity    Alcohol  use: No    Drug use: No    Sexual activity: Not on file   Other Topics Concern    Ability to Walk 1 Flight of Steps without SOB/CP Not Asked    Routine Exercise Not Asked    Ability to Walk 2 Flight of Steps without SOB/CP Yes    Unable to Ambulate Not Asked    Total Care Not Asked    Ability To Do Own ADL's Yes    Uses Walker Not Asked    Other Activity Level Not Asked    Uses Cane Not Asked   Social History Narrative    Not on file     Social Determinants of Health     Financial Resource Strain: Not on file   Transportation Needs: Not on file   Social Connections: Not on file   Intimate Partner Violence: Not on file   Housing Stability: Not on file       ROS: Pertinent issues as addressed elsewhere    Physical Exam:  Vitals:    10/18/24 0957   BP: 129/73   Pulse: 81   Temp: 36.1 C (96.9 F)   TempSrc: Thermal Scan   SpO2: 99%   Weight: 85.6 kg (188 lb 12.8 oz)   Height: 1.575 m (5' 2)   BMI: 34.53         Gen-  no acute distress  HEENT- normocephalic, atraumatic  Neck- supple, trachea midline, without obvious nodules, good ROM  CV- RRR with details per VS: BP/HR   Lungs- unlabored respirations without accessory muscle use  Abd- Soft, NT/ND, no rebound/guarding  Skin- warm and dry  Neuro- grossly normal, oriented x 3  Extremities- no clubbing, cyanosis, or edema with right forearm mass as noted above, on dorsal aspect    Labs and Imaging Reviewed.     Labs/Imaging:    Office Visit on 10/16/2024   Component Date Value Ref Range Status    SARS-COV-2, POC 10/16/2024 Negative  Negative Final  INFLUENZA VIRUS A, PCR 4PLEX, POC 10/16/2024 Negative  Negative Final    INFLUENZA VIRUS B, PCR 4PLEX, POC 10/16/2024 Negative  Negative Final    RSV, PCR 4PLEX, POC 10/16/2024 Negative  Negative Final   Office Visit on 10/09/2024   Component Date Value Ref Range Status    POCT HGB A1C 10/09/2024 5.0  4 - 6 % Final    MICROALBUMIN RANDOM URINE 10/09/2024 10   Final    CREATININE,  UR RAND 10/09/2024 300   Final    MICROALBUMIN/CREAT RATIO 10/09/2024 <30   Final     No results found for this or any previous visit (from the past [id number] hours).    Recent Results (from the past 2160 hours)   US  SUPERFICIAL SOFT TISSUE(NON BREAST)     Status: None    Narrative    NATHANEL BIRMINGHAM  Female, 68 years old.    US  SOFT TISSUE RIGHT UPPER EXTREMITY performed on 10/16/2024 11:41 AM.    REASON FOR EXAM:  M79.89: Nodule of soft tissue    COMPARISON: None available    TECHNIQUE: Real-time focused ultrasound the region of interest, right lateral forearm with a grayscale and color Doppler analysis.    FINDINGS:    There is an isoechoic lesion seen just deep to the skin in the region of interest measuring 5.7 x 1.2 x 4.8 cm. There is no evidence of abnormal shadowing. Minimal vascularity is noted at the peripheral aspect of the lesion.      Impression    Suspect lipoma in the region of interest.        Radiologist location ID: [id number]         ASSESSMENT:  Lipoma suspected but rapid change in size and symptoms is concerning.    PLAN:  Core needle biopsy here at the office under local    Wide excision in OR later this month.    Excision extent determined by core pathology.    Preop EKG and labs per routine    Patient prefers General. LMA ok.    Bruising, pain, scarring, dehiscence, open wound care, infection, recurrence, need for reoperation, delayed healing.    [name], MD            [1] No Known Allergies

## 2024-10-18 NOTE — H&P (View-Only) (Signed)
 [facility]  [address]  [place] [address]  Operated by [facility]       On the date of this encounter, a total of 40 minutes was spent on this patient encounter including review of historical information, examination, documentation and post-visit activities. This time documented excludes any procedural time.    Chaperone declined by patient.    Surgical History and Physical      Name: Toni Tran MRN:  [id number]   Date: 10/18/2024 DOB:  December 06, 1956 (68 y.o.)     History:  CC: soft tissue mass right arm about 8 x 4 cm soft and mobile with respect to the fascia but somewhat adherent to the skin with slight dimpling effect. C/O tingling sensation like ants crawling when palpated. New change in size x 100% over the last three months although was stable about 2-3 cm one year ago and then six months ago was 4 cm.    HPI: No hx skin cancer but had benign scalp cysts removed by dermatology. No hx of lipomas.     Past Medical History:   Diagnosis Date    Anxiety     Blood thinned due to long-term anticoagulant use     Aspirin  325mg . 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, right sided weakness    Depression     History of anesthesia complications     Required overnight stay after tarsal tunnel release d/t hypoxia per pt,     Hypercholesterolemia     Hyperlipidemia     Hypertension     Mass of arm, right 10/18/2024    MRSA infection     Obesity     Peripheral edema     right foot    Peripheral neuropathy     Right foot s/p tarsal tunnel release    Plantar fascial fibromatosis of right foot     PONV (postoperative nausea and vomiting)     requests Anti Emetic    RLS (restless legs syndrome)     Snores     Never has had sleep study    Tarsal tunnel syndrome, right     Vitamin D  deficiency     Wears glasses          Past Surgical History:   Procedure Laterality Date    HX  APPENDECTOMY  childhood    HX EYE SURGERY Bilateral Childhood    Muscle Surgery    HX FOOT SURGERY Right 06/07/2017    Tarsal Tunnel release; FASCIOTOMY PLANTAR    HX SHOULDER SURGERY Right 06/2016    Exc. Bone Spur, Repair Rotator Cuff Tear    HX TUBAL LIGATION  1992    KNEE SURGERY Left          Family Medical History:       Problem Relation (Age of Onset)    Alzheimer's/Dementia Mother, Father    Arthritis-osteo Mother, Father    Blood Clots Father    Breast Cancer Maternal Aunt, Maternal Aunt    Congestive Heart Failure Father    Coronary Artery Disease Father    Diabetes Father    Heart Attack Mother, Father    High Cholesterol Mother, Father    Hypertension (High Blood Pressure) Mother, Father    Migraines Mother, Father    Sleep disorders Father    Stroke Mother,  Father            amLODIPine  (NORVASC ) 10 mg Oral Tablet, Take 1 Tablet (10 mg total) by mouth Daily AMLODIPINE  BESYLATE  Benzonatate  (TESSALON ) 200 mg Oral Capsule, Take 1 Capsule (200 mg total) by mouth Three times a day as needed for Cough  lisinopriL -hydrochlorothiazide  (ZESTORETIC ) 20-12.5 mg Oral Tablet, Take 1 Tablet by mouth Twice daily  predniSONE  (DELTASONE ) 50 mg Oral Tablet, Take 1 Tablet (50 mg total) by mouth Daily for 4 days  semaglutide  (OZEMPIC ) 0.25 mg or 0.5 mg (2 mg/3 mL) Subcutaneous Pen Injector, Inject 0.5 mg under the skin Every 7 days  simvastatin  (ZOCOR ) 20 mg Oral Tablet, Take 1 Tablet (20 mg total) by mouth Every evening  traZODone  (DESYREL ) 100 mg Oral Tablet, Take 1 Tablet (100 mg total) by mouth Every night    No facility-administered medications prior to visit.    Allergies[1]  Social History     Socioeconomic History    Marital status: Single     Spouse name: Not on file    Number of children: Not on file    Years of education: Not on file    Highest education level: Not on file   Occupational History    Not on file   Tobacco Use    Smoking status: Never    Smokeless tobacco: Never   Vaping Use    Vaping status: Never  Used   Substance and Sexual Activity    Alcohol  use: No    Drug use: No    Sexual activity: Not on file   Other Topics Concern    Ability to Walk 1 Flight of Steps without SOB/CP Not Asked    Routine Exercise Not Asked    Ability to Walk 2 Flight of Steps without SOB/CP Yes    Unable to Ambulate Not Asked    Total Care Not Asked    Ability To Do Own ADL's Yes    Uses Walker Not Asked    Other Activity Level Not Asked    Uses Cane Not Asked   Social History Narrative    Not on file     Social Determinants of Health     Financial Resource Strain: Not on file   Transportation Needs: Not on file   Social Connections: Not on file   Intimate Partner Violence: Not on file   Housing Stability: Not on file       ROS: Pertinent issues as addressed elsewhere    Physical Exam:  Vitals:    10/18/24 0957   BP: 129/73   Pulse: 81   Temp: 36.1 C (96.9 F)   TempSrc: Thermal Scan   SpO2: 99%   Weight: 85.6 kg (188 lb 12.8 oz)   Height: 1.575 m (5' 2)   BMI: 34.53         Gen-  no acute distress  HEENT- normocephalic, atraumatic  Neck- supple, trachea midline, without obvious nodules, good ROM  CV- RRR with details per VS: BP/HR   Lungs- unlabored respirations without accessory muscle use  Abd- Soft, NT/ND, no rebound/guarding  Skin- warm and dry  Neuro- grossly normal, oriented x 3  Extremities- no clubbing, cyanosis, or edema with right forearm mass as noted above, on dorsal aspect    Labs and Imaging Reviewed.     Labs/Imaging:    Office Visit on 10/16/2024   Component Date Value Ref Range Status    SARS-COV-2, POC 10/16/2024 Negative  Negative Final  INFLUENZA VIRUS A, PCR 4PLEX, POC 10/16/2024 Negative  Negative Final    INFLUENZA VIRUS B, PCR 4PLEX, POC 10/16/2024 Negative  Negative Final    RSV, PCR 4PLEX, POC 10/16/2024 Negative  Negative Final   Office Visit on 10/09/2024   Component Date Value Ref Range Status    POCT HGB A1C 10/09/2024 5.0  4 - 6 % Final    MICROALBUMIN RANDOM URINE 10/09/2024 10   Final    CREATININE,  UR RAND 10/09/2024 300   Final    MICROALBUMIN/CREAT RATIO 10/09/2024 <30   Final     No results found for this or any previous visit (from the past [id number] hours).    Recent Results (from the past 2160 hours)   US  SUPERFICIAL SOFT TISSUE(NON BREAST)     Status: None    Narrative    NATHANEL BIRMINGHAM  Female, 68 years old.    US  SOFT TISSUE RIGHT UPPER EXTREMITY performed on 10/16/2024 11:41 AM.    REASON FOR EXAM:  M79.89: Nodule of soft tissue    COMPARISON: None available    TECHNIQUE: Real-time focused ultrasound the region of interest, right lateral forearm with a grayscale and color Doppler analysis.    FINDINGS:    There is an isoechoic lesion seen just deep to the skin in the region of interest measuring 5.7 x 1.2 x 4.8 cm. There is no evidence of abnormal shadowing. Minimal vascularity is noted at the peripheral aspect of the lesion.      Impression    Suspect lipoma in the region of interest.        Radiologist location ID: [id number]         ASSESSMENT:  Lipoma suspected but rapid change in size and symptoms is concerning.    PLAN:  Core needle biopsy here at the office under local    Wide excision in OR later this month.    Excision extent determined by core pathology.    Preop EKG and labs per routine    Patient prefers General. LMA ok.    Bruising, pain, scarring, dehiscence, open wound care, infection, recurrence, need for reoperation, delayed healing.    [name], MD            [1] No Known Allergies

## 2024-10-23 ENCOUNTER — Ambulatory Visit
Admission: RE | Admit: 2024-10-23 | Discharge: 2024-10-23 | Disposition: A | Discharge status: Home / Self Care | Source: Ambulatory Visit | Attending: GENERAL SURGERY | Admitting: GENERAL SURGERY

## 2024-10-23 ENCOUNTER — Encounter (HOSPITAL_BASED_OUTPATIENT_CLINIC_OR_DEPARTMENT_OTHER): Payer: Self-pay

## 2024-10-23 ENCOUNTER — Other Ambulatory Visit: Payer: Self-pay

## 2024-10-23 DIAGNOSIS — M7989 Other specified soft tissue disorders: Secondary | ICD-10-CM | POA: Insufficient documentation

## 2024-10-23 DIAGNOSIS — Z01818 Encounter for other preprocedural examination: Secondary | ICD-10-CM | POA: Insufficient documentation

## 2024-10-23 LAB — ECG 12-LEAD
Atrial Rate: 68 {beats}/min
Calculated P Axis: 45 degrees
Calculated R Axis: -23 degrees
Calculated T Axis: 31 degrees
PR Interval: 156 ms
QRS Duration: 132 ms
QT Interval: 446 ms
QTC Calculation: 474 ms
Ventricular rate: 68 {beats}/min

## 2024-10-24 ENCOUNTER — Encounter (HOSPITAL_BASED_OUTPATIENT_CLINIC_OR_DEPARTMENT_OTHER): Payer: Self-pay

## 2024-10-24 ENCOUNTER — Ambulatory Visit
Admission: RE | Admit: 2024-10-24 | Discharge: 2024-10-24 | Disposition: A | Discharge status: Home / Self Care | Source: Ambulatory Visit | Attending: Student in an Organized Health Care Education/Training Program | Admitting: Student in an Organized Health Care Education/Training Program

## 2024-10-24 HISTORY — DX: Sleep apnea, unspecified: G47.30

## 2024-10-24 HISTORY — DX: Unspecified right bundle-branch block: I45.10

## 2024-10-24 HISTORY — DX: Type 2 diabetes mellitus without complications: E11.9

## 2024-10-24 HISTORY — DX: Cardiac murmur, unspecified: R01.1

## 2024-10-25 ENCOUNTER — Other Ambulatory Visit: Payer: Self-pay

## 2024-10-25 ENCOUNTER — Other Ambulatory Visit (HOSPITAL_COMMUNITY)

## 2024-10-25 ENCOUNTER — Ambulatory Visit: Attending: GENERAL SURGERY | Admitting: GENERAL SURGERY

## 2024-10-25 ENCOUNTER — Encounter (HOSPITAL_COMMUNITY): Payer: Self-pay | Admitting: GENERAL SURGERY

## 2024-10-25 VITALS — BP 134/67 | HR 80 | Temp 97.3°F | Ht 62.0 in | Wt 189.6 lb

## 2024-10-25 DIAGNOSIS — R2231 Localized swelling, mass and lump, right upper limb: Secondary | ICD-10-CM | POA: Insufficient documentation

## 2024-10-25 NOTE — Procedures (Signed)
 [facility]  [address]  [place] [address]  Operated by [facility]  Procedure Note    Name: Toni Tran MRN:  [id number]   Date: 10/25/2024 DOB:  10-21-1956 (68 y.o.)         Procedures  Preoperative diagnosis soft tissue mass right forearm  Postop diagnosis soft tissue mass right forearm  Procedure core needle biopsy soft tissue mass right forearm  Surgeon Lorre Opdahl jinny Collet MD  Blood loss none  Complication none  Anesthetic local 1 mL lidocaine   Specimen 2 needle cores submitted  Indication moderately large soft tissue mass with rapid change and symptoms with the overlying skin dimpling and skin tethering  Procedure with the patient in the office procedural area sitting in the right arm flexed at the elbow to expose the forearm soft tissue mass the site is prepped locally anesthetized and the tip of an 11 scalpel blade was used to puncture through the dermis allowing easy placement of the core needle biopsy instrument which was fired twice through the same skin opening being redirected and retrieving 2 adipose cores which were submitted in formalin.  The site was auto hemostatic with gentle pressure and skin glue was used to provided barrier dressing.  The patient is discharged home having tolerated the procedure well.  [name], MD

## 2024-10-25 NOTE — Addendum Note (Signed)
 Addended by: GEROME DEMPSEY PAC on: 10/25/2024 02:48 PM     Modules accepted: Orders

## 2024-10-29 ENCOUNTER — Telehealth (HOSPITAL_COMMUNITY): Payer: Self-pay | Admitting: GENERAL SURGERY

## 2024-10-29 LAB — SURGICAL PATHOLOGY SPECIMEN

## 2024-10-29 NOTE — Telephone Encounter (Signed)
 Called pt to inform her of her biopsy results that she had completed in the office on 10/25/2024. Results came back benign.

## 2024-10-29 NOTE — Telephone Encounter (Signed)
 Patient called in wanting to know the results of her biopsy from 11/13. Please reach out to the patient when available. [phone]      Sheppard Molt  Centralized Scheduler  Access Center  10/29/2024, 10:55

## 2024-10-30 ENCOUNTER — Other Ambulatory Visit: Payer: Self-pay

## 2024-10-30 ENCOUNTER — Ambulatory Visit: Attending: GENERAL SURGERY

## 2024-10-30 DIAGNOSIS — M7989 Other specified soft tissue disorders: Secondary | ICD-10-CM | POA: Insufficient documentation

## 2024-10-30 DIAGNOSIS — Z01818 Encounter for other preprocedural examination: Secondary | ICD-10-CM | POA: Insufficient documentation

## 2024-10-30 LAB — COMPREHENSIVE METABOLIC PANEL, NON-FASTING
ALBUMIN: 4 g/dL (ref 3.4–4.8)
ALKALINE PHOSPHATASE: 76 U/L (ref 55–145)
ALT (SGPT): 21 U/L (ref <31)
ANION GAP: 12 mmol/L (ref 4–13)
AST (SGOT): 22 U/L (ref 11–34)
BILIRUBIN TOTAL: 1 mg/dL (ref 0.3–1.3)
BUN/CREA RATIO: 16 (ref 6–22)
BUN: 12 mg/dL (ref 8–25)
CALCIUM: 9.3 mg/dL (ref 8.6–10.3)
CHLORIDE: 103 mmol/L (ref 96–111)
CO2 TOTAL: 27 mmol/L (ref 23–31)
CREATININE: 0.76 mg/dL (ref 0.60–1.05)
POTASSIUM: 4.2 mmol/L (ref 3.5–5.1)
PROTEIN TOTAL: 7.1 g/dL (ref 5.6–7.6)
SODIUM: 142 mmol/L (ref 136–145)
eGFRcr - FEMALE: 85 mL/min/1.73mˆ2 (ref >=60)

## 2024-10-30 LAB — CBC
HCT: 41.1 % (ref 34.8–46.0)
HGB: 14.5 g/dL (ref 11.5–16.0)
MCH: 34 pg — ABNORMAL HIGH (ref 26.0–32.0)
MCHC: 35.3 g/dL (ref 31.0–35.5)
MCV: 96.5 fL (ref 78.0–100.0)
MPV: 12.4 fL (ref 8.7–12.5)
RBC: 4.26 x10ˆ6/uL (ref 3.85–5.22)
RDW-CV: 12.1 % (ref 11.5–15.5)
WBC: 9.4 x10ˆ3/uL (ref 3.7–11.0)

## 2024-11-03 DIAGNOSIS — M8580 Other specified disorders of bone density and structure, unspecified site: Secondary | ICD-10-CM | POA: Insufficient documentation

## 2024-11-05 ENCOUNTER — Telehealth (HOSPITAL_COMMUNITY): Payer: Self-pay | Admitting: GENERAL SURGERY

## 2024-11-05 MED ORDER — SODIUM CHLORIDE 0.9 % INTRAVENOUS SOLUTION
2.0000 g | Freq: Once | INTRAVENOUS | Status: AC
Start: 2024-11-06 — End: 2024-11-06
  Administered 2024-11-06: 2 g via INTRAVENOUS
  Filled 2024-11-05: qty 14.71

## 2024-11-05 NOTE — Telephone Encounter (Signed)
 Called pt  645 am arrival   at [facility]   with Dr Gerome   Pt verbalized understanding  Surgery order and consent scanned in media manager on 10/25/24.    Alan Pendleton, MA

## 2024-11-06 ENCOUNTER — Other Ambulatory Visit: Payer: Self-pay

## 2024-11-06 ENCOUNTER — Ambulatory Visit (HOSPITAL_COMMUNITY)

## 2024-11-06 ENCOUNTER — Ambulatory Visit
Admission: RE | Admit: 2024-11-06 | Discharge: 2024-11-06 | Disposition: A | Discharge status: Home / Self Care | Source: Ambulatory Visit | Attending: GENERAL SURGERY | Admitting: GENERAL SURGERY

## 2024-11-06 ENCOUNTER — Encounter (HOSPITAL_COMMUNITY): Payer: Self-pay | Admitting: GENERAL SURGERY

## 2024-11-06 ENCOUNTER — Encounter (HOSPITAL_COMMUNITY)
Admission: RE | Disposition: A | Discharge status: Home / Self Care | Payer: Self-pay | Source: Ambulatory Visit | Attending: GENERAL SURGERY

## 2024-11-06 ENCOUNTER — Ambulatory Visit (HOSPITAL_COMMUNITY): Payer: Self-pay | Admitting: Student in an Organized Health Care Education/Training Program

## 2024-11-06 DIAGNOSIS — D492 Neoplasm of unspecified behavior of bone, soft tissue, and skin: Secondary | ICD-10-CM | POA: Diagnosis present

## 2024-11-06 DIAGNOSIS — D1721 Benign lipomatous neoplasm of skin and subcutaneous tissue of right arm: Secondary | ICD-10-CM

## 2024-11-06 LAB — POC FINGERSTICK GLUCOSE - BMC/JMC (RESULTS): GLUCOSE, POC: 133 mg/dL — ABNORMAL HIGH (ref 60–100)

## 2024-11-06 SURGERY — EXCISION MASS UPPER EXTREMITY
Anesthesia: General | Site: Arm Lower | Laterality: Right | Wound class: Clean Wound: Uninfected operative wounds in which no inflammation occurred

## 2024-11-06 MED ORDER — ACETAMINOPHEN 1,000 MG/100 ML (10 MG/ML) INTRAVENOUS SOLUTION
INTRAVENOUS | Status: AC
Start: 2024-11-06 — End: 2024-11-06
  Filled 2024-11-06: qty 100

## 2024-11-06 MED ORDER — DEXTROSE 5 % AND LACTATED RINGERS BOLUS
250.0000 mL | Freq: Once | INTRAVENOUS | Status: DC
Start: 2024-11-06 — End: 2024-11-06

## 2024-11-06 MED ORDER — ONDANSETRON HCL (PF) 4 MG/2 ML INJECTION SOLUTION
INTRAMUSCULAR | Status: AC
Start: 2024-11-06 — End: 2024-11-06
  Filled 2024-11-06: qty 2

## 2024-11-06 MED ORDER — PROPOFOL 10 MG/ML IV BOLUS
INJECTION | Freq: Once | INTRAVENOUS | Status: DC | PRN
Start: 2024-11-06 — End: 2024-11-06
  Administered 2024-11-06: 200 mg via INTRAVENOUS

## 2024-11-06 MED ORDER — EPHEDRINE SULFATE 5 MG/ML INTRAVENOUS SOLUTION
INTRAVENOUS | Status: AC
Start: 2024-11-06 — End: 2024-11-06
  Filled 2024-11-06: qty 10

## 2024-11-06 MED ORDER — ALBUTEROL SULFATE 2.5 MG/3 ML (0.083 %) SOLUTION FOR NEBULIZATION
5.0000 mg | INHALATION_SOLUTION | Freq: Once | RESPIRATORY_TRACT | Status: DC | PRN
Start: 2024-11-06 — End: 2024-11-06

## 2024-11-06 MED ORDER — FENTANYL (PF) 50 MCG/ML INJECTION SOLUTION
INTRAMUSCULAR | Status: AC
Start: 2024-11-06 — End: 2024-11-06
  Filled 2024-11-06: qty 2

## 2024-11-06 MED ORDER — HYDROMORPHONE (PF) 0.5 MG/0.5 ML INJECTION SYRINGE
0.5000 mg | INJECTION | INTRAMUSCULAR | Status: DC | PRN
Start: 2024-11-06 — End: 2024-11-06
  Administered 2024-11-06: 0.5 mg via INTRAVENOUS
  Filled 2024-11-06: qty 0.5

## 2024-11-06 MED ORDER — APREPITANT 40 MG CAPSULE
40.0000 mg | ORAL_CAPSULE | Freq: Once | ORAL | Status: AC
Start: 2024-11-06 — End: 2024-11-06
  Administered 2024-11-06: 40 mg via ORAL
  Filled 2024-11-06: qty 1

## 2024-11-06 MED ORDER — SUCCINYLCHOLINE CHLORIDE 20 MG/ML INJECTION SOLUTION
INTRAMUSCULAR | Status: AC
Start: 2024-11-06 — End: 2024-11-06
  Filled 2024-11-06: qty 10

## 2024-11-06 MED ORDER — ATROPINE 0.4 MG/ML INJECTION WRAPPER
INTRAMUSCULAR | Status: AC
Start: 2024-11-06 — End: 2024-11-06
  Filled 2024-11-06: qty 1

## 2024-11-06 MED ORDER — GLYCOPYRROLATE 0.2 MG/ML INJECTION SOLUTION
INTRAMUSCULAR | Status: AC
Start: 2024-11-06 — End: 2024-11-06
  Filled 2024-11-06: qty 1

## 2024-11-06 MED ORDER — LACTATED RINGERS INTRAVENOUS SOLUTION
INTRAVENOUS | Status: DC | PRN
Start: 2024-11-06 — End: 2024-11-06

## 2024-11-06 MED ORDER — PROPOFOL 10 MG/ML IV - CHI
INTRAVENOUS | Status: DC | PRN
Start: 2024-11-06 — End: 2024-11-06
  Administered 2024-11-06: 0 ug/kg/min via INTRAVENOUS
  Administered 2024-11-06: 150 ug/kg/min via INTRAVENOUS

## 2024-11-06 MED ORDER — DEXAMETHASONE SODIUM PHOSPHATE 4 MG/ML INJECTION SOLUTION
Freq: Once | INTRAMUSCULAR | Status: DC | PRN
Start: 2024-11-06 — End: 2024-11-06
  Administered 2024-11-06: 8 mg via INTRAVENOUS

## 2024-11-06 MED ORDER — DEXAMETHASONE SODIUM PHOSPHATE 4 MG/ML INJECTION SOLUTION
INTRAMUSCULAR | Status: AC
Start: 2024-11-06 — End: 2024-11-06
  Filled 2024-11-06: qty 1

## 2024-11-06 MED ORDER — PROPOFOL 10 MG/ML INTRAVENOUS EMULSION
INTRAVENOUS | Status: AC
Start: 2024-11-06 — End: 2024-11-06
  Filled 2024-11-06: qty 50

## 2024-11-06 MED ORDER — PROPOFOL 10 MG/ML INTRAVENOUS EMULSION
INTRAVENOUS | Status: AC
Start: 2024-11-06 — End: 2024-11-06
  Filled 2024-11-06: qty 40

## 2024-11-06 MED ORDER — FENTANYL (PF) 50 MCG/ML INJECTION SOLUTION
50.0000 ug | INTRAMUSCULAR | Status: DC | PRN
Start: 2024-11-06 — End: 2024-11-06

## 2024-11-06 MED ORDER — ONDANSETRON HCL (PF) 4 MG/2 ML INJECTION SOLUTION
Freq: Once | INTRAMUSCULAR | Status: DC | PRN
Start: 2024-11-06 — End: 2024-11-06
  Administered 2024-11-06: 4 mg via INTRAVENOUS

## 2024-11-06 MED ORDER — ACETAMINOPHEN 1,000 MG/100 ML (10 MG/ML) INTRAVENOUS SOLUTION
Freq: Once | INTRAVENOUS | Status: DC | PRN
Start: 2024-11-06 — End: 2024-11-06
  Administered 2024-11-06: 1000 mg via INTRAVENOUS

## 2024-11-06 MED ORDER — PHENYLEPHRINE 1 MG/10 ML (100 MCG/ML) IN 0.9 % SOD.CHLORIDE IV SYRINGE
INJECTION | INTRAVENOUS | Status: AC
Start: 2024-11-06 — End: 2024-11-06
  Filled 2024-11-06: qty 10

## 2024-11-06 MED ORDER — BUPIVACAINE-EPINEPHRINE (PF) 0.25 %-1:200,000 INJECTION SOLUTION
INTRAMUSCULAR | Status: AC
Start: 2024-11-06 — End: 2024-11-06
  Filled 2024-11-06: qty 30

## 2024-11-06 MED ORDER — LIDOCAINE (PF) 20 MG/ML (2 %) INJECTION SOLUTION
INTRAMUSCULAR | Status: AC
Start: 2024-11-06 — End: 2024-11-06
  Filled 2024-11-06: qty 5

## 2024-11-06 MED ORDER — FENTANYL (PF) 50 MCG/ML INJECTION SOLUTION
Freq: Once | INTRAMUSCULAR | Status: DC | PRN
Start: 2024-11-06 — End: 2024-11-06
  Administered 2024-11-06 (×2): 50 ug via INTRAVENOUS

## 2024-11-06 MED ORDER — TRAMADOL 50 MG TABLET: 1.0000 | ORAL_TABLET | Freq: Four times a day (QID) | ORAL | 0 refills | Status: DC | PRN

## 2024-11-06 MED ORDER — LACTATED RINGERS INTRAVENOUS SOLUTION: INTRAVENOUS | Status: DC

## 2024-11-06 MED ORDER — ONDANSETRON HCL (PF) 4 MG/2 ML INJECTION SOLUTION
4.0000 mg | INTRAMUSCULAR | Status: DC | PRN
Start: 2024-11-06 — End: 2024-11-06
  Administered 2024-11-06: 4 mg via INTRAVENOUS
  Filled 2024-11-06: qty 2

## 2024-11-06 MED ORDER — LIDOCAINE (PF) 100 MG/5 ML (2 %) INTRAVENOUS SYRINGE
INJECTION | Freq: Once | INTRAVENOUS | Status: DC | PRN
Start: 2024-11-06 — End: 2024-11-06
  Administered 2024-11-06: 100 mg via INTRAVENOUS

## 2024-11-06 MED ORDER — DROPERIDOL 2.5 MG/ML INJECTION SOLUTION
0.6250 mg | Freq: Once | INTRAMUSCULAR | Status: DC | PRN
Start: 2024-11-06 — End: 2024-11-06

## 2024-11-06 SURGICAL SUPPLY — 16 items
BANDAGE 4.1YDX4.5IN 6 PLY HYPOALL COTTON LRG GAUZE WHT STRL LF  DISP (WOUND CARE SUPPLY) ×2 IMPLANT
BANDAGE MATRIX 5YDX2IN NONST ELAS HKLP CLSR PLSTR COTTON MED COMPRESS BGE WHT LF (WOUND CARE SUPPLY) ×4 IMPLANT
BANDAGE MATRIX 5YDX4IN NONST ELAS HKLP CLSR PLSTR COTTON MED COMPRESS LF  DISP (WOUND CARE SUPPLY) ×4 IMPLANT
BLADE 15 BD SHARP LOCK RETRACT SHIELD SS SURG STRL LF  DISP (SURGICAL CUTTING SUPPLIES) ×2 IMPLANT
DISCONTINUED USE ITEM 61830 - SUTURE 3-0 FS2 VICRYL 27IN UNDYED BRD COAT ABS (SUTURE/WOUND CLOSURE) ×6 IMPLANT
DRESS PETRO 3X3IN CURAD GAUZE OIL EMUL NONADH NONOCCLUSIVE IMPREGNATE KNIT LF  STRL WHT (WOUND CARE SUPPLY) ×4 IMPLANT
GARMENT COMPRESS LRG CALF CENTAURA NYL VASOGRAD LTWT BRTHBL SEQ FIL BLU 24- IN (MED SURG SUPPLIES) IMPLANT
GARMENT COMPRESS MED CALF CENTAURA NYL VASOGRAD LTWT BRTHBL SEQ FIL BLU 18- IN (MED SURG SUPPLIES) IMPLANT
GLOVE SURG 8 LTX PF SMOOTH BEAD CUF STRL BRN 11.6IN PROTEXIS DDRGL THK13.4 MIL (GLOVES AND ACCESSORIES) ×2 IMPLANT
GOWN SURG XL STD LGTH AAMI L4 BRTHBL HKLP CLSR LINT RST STRL AERO CR SMS FILM (DRAPE/PACKS/SHEETS/OR TOWEL) ×2 IMPLANT
NEEDLE HYPO  25GA 5/8IN REG WL SFGLD POLYPROP REG BVL SHIELD MECH SUBCU BLU STRL DISP (MED SURG SUPPLIES) ×2 IMPLANT
SPONGE GAUZE 4X4IN MDCHC COTTON 12 PLY TY 7 LF  STRL DISP (WOUND CARE SUPPLY) ×2 IMPLANT
SUTURE PLAMD ETHILON BLK MONOF NONAB STRL LF (SUTURE/WOUND CLOSURE) ×8 IMPLANT
SYRINGE LL 10ML LF  STRL GRAD N-PYRG DEHP-FR PVC FREE MED DISP (MED SURG SUPPLIES) ×2 IMPLANT
TRAY SKIN SCRUB 8IN VNYL COTTON 6 WNG 6 SPONGE STICK 2 TIP APPL DRY STRL LF (MED SURG SUPPLIES) ×2 IMPLANT
TRAY SMALL SETUP ~~LOC~~ - ~~LOC~~ MEDICAL CTR (CUSTOM TRAYS & PACK) ×2 IMPLANT

## 2024-11-06 NOTE — Interval H&P Note (Signed)
 [facility]     H&P Update Form                                                Toni Tran, Toni Tran, 68 y.o. female  Date of Admission:  11/06/2024  Date of Birth:  [birth year]    11/06/2024    Outpatient Pre-Surgical H & P updated the day of the procedure.  1.  H&P completed within 30 days of surgical procedure and  has been reviewed within 24 hours of the surgery, the patient has been examined, and no change has occured in the patients condition since the H&P was completed.       Change in medications: No    2.  Patient continues to be appropiate candidate for planned surgical procedure. YES    [name], MD  11/06/2024, 07:54

## 2024-11-06 NOTE — OR Surgeon (Signed)
 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]     Post-Operative Diagnosis:   Soft tissue tumor    Procedure(s)/Description:    EXCISION MASS UPPER EXTREMITY: 11420 (CPT)    Excision apparent lipoma right forearm with lesion + margin = 6 x 3.5 x 1.5 cm    Intermediate layered closure x 5 cm    Attending Surgeon: [name], MD     Assistant Surgeon: Surgeons and Role:     DEWAINE Collet Dempsey, MD - Primary     Anesthesia Staff:  Anesthesiologist: Theodore Lonni HERO, MD  CRNA: Dietra Slough, APRN,CRNA  Student Nurse Anesthetist: Nicholaus Waddell, SRNA    Anesthesia Type: General     Estimated Blood Loss: minimal     Specimens Removed:   ID Type Source Tests Collected by Time Destination   1 : Forearm Lipoma Tissue Arm SURGICAL PATHOLOGY SPECIMEN Collet Dempsey, MD 11/06/2024 0827       Order Name Source Comment Collection Info Order Time   SURGICAL PATHOLOGY SPECIMEN Arm Pre-op diagnosis:  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    Indications:  Patient is a 68 y.o. female who presents for excision due to symptomatic soft tissue mass.    Description of Procedure/Operation:  The patient was brought to the operating room and placed in a supine position with the right arm supported on an armboard and a wrist strap used to keep the hand/wrist elevated for exposure. The surgical field was prepped and draped in the usual sterile fashion.   A long axis incision was made in immediately subjacent to the dermis I encountered the lipoma.  It was well encapsulated with a typical pseudo capsule.  This was removed with mostly blunt and minimal sharp dissection hemostatically.  The  entirety of the lesion was submitted intact and no palpable residual was left behind.  Hemostasis was assured and the wound was coapted in a layered fashion with interrupted 3-0 Vicryl suturing the dermis together as well as incorporating the dermal closure into the deep aspect of the dissection field to minimize dead space and seroma formation.  The epidermis was finally coapted with a running subcuticular 4-0 Prolene pullout stitch and skin glue.  The sponge, instrument, and needle counts were reported as correct.  The patient tolerated the procedure well, anticipating discharge home later today as able.    [name], MD

## 2024-11-06 NOTE — Anesthesia Transfer of Care (Signed)
 ANESTHESIA TRANSFER OF CARE   Toni Tran is a 68 y.o. 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  No significant valvular heart disease.    Hyperlipidemia     Hypertension     Mass of arm, right 10/18/2024    MRSA infection 2021    Obesity     Peripheral edema     right foot comes and goes    Peripheral neuropathy     Right foot s/p tarsal tunnel release    Plantar fascial fibromatosis of right foot     PONV (postoperative nausea and vomiting)     requests Anti Emetic    RBBB     RLS (restless legs syndrome)     Sleep apnea     doesn't tolerate cpap I feel like I'm suffocating    Tarsal tunnel syndrome, right     Type 2 diabetes mellitus     Vitamin D  deficiency     Wears glasses       Allergy History as of 11/06/24        No Known Allergies                  I completed my transfer of care / handoff to the receiving personnel during which we discussed:  Access, Airway, All key/critical aspects of case discussed, Analgesia, Antibiotics, Expectation of post procedure, Fluids/Product, Gave opportunity for questions and acknowledgement of understanding, Labs and PMHx      Post Location: PACU                        Additional Info:Report given to PACU RN. VSS. No further questions/concerns.                                     Last OR Temp: Temperature: 36.6 C (97.9 F)      Airway:* No LDAs found *  Blood pressure  118/62, pulse 79, temperature 36.6 C (97.9 F), resp. rate 15, height 1.581 m (5' 2.24), weight 87.1 kg (192 lb), SpO2 99%, not currently breastfeeding.

## 2024-11-06 NOTE — Discharge Instructions (Signed)
 Surgical Discharge Instructions      THINGS TO DO AND NOT DO    Daily shower: soap and water  wash but do not immerse in a bathtub or hot tub or pool for the first five days.  Avoid bumping the surgical site or putting pressure on it.  Walk and use steps now.  Drive when capable and comfortable off pain pills, starting no sooner than tomorrow.  Resume usual diet as desired now. Avoid dehydration the first few days by drinking extra fluids.  Make an appointment soon for an office wound check, if you were not already given one.  Ice pack as needed, twenty minutes on and twenty minutes off for the first 2-3 days--optional, as you see fit.    THINGS YOU CAN TAKE    Sleep assistance: over-the-counter Benadryl  (diphenhydramine ) 25 or 50 mg.  Pain control, over the counter: Tylenol  (acetaminophen ) 1000 mg plus ibuprofen  400 mg, up to 3 times per day for the 1st week. Prescription: Tramadol , mild narcotic only as needed.  Constipation: MiraLax laxative powder once or twice daily to have regular bowel movements without straining.

## 2024-11-06 NOTE — Anesthesia Postprocedure Evaluation (Signed)
 Anesthesia Post Op Evaluation    Patient: Toni Tran  Procedure(s):  EXCISION MASS UPPER EXTREMITY    Last Vitals:Temperature: 36.2 C (97.2 F) (11/06/24 0930)  Heart Rate: 79 (11/06/24 1000)  BP (Non-Invasive): 136/60 (11/06/24 1000)  Respiratory Rate: 14 (11/06/24 0941)  SpO2: 92 % (11/06/24 1000)    No notable events documented.    Patient is sufficiently recovered from the effects of anesthesia to participate in the evaluation and has returned to their pre-procedure level.  Patient location during evaluation: PACU       Patient participation: complete - patient participated  Level of consciousness: awake and alert and responsive to verbal stimuli    Pain management: adequate  Airway patency: patent    Anesthetic complications: no  Cardiovascular status: acceptable  Respiratory status: acceptable  Hydration status: acceptable  Patient post-procedure temperature: Pt Normothermic   PONV Status: Absent

## 2024-11-06 NOTE — Anesthesia Preprocedure Evaluation (Signed)
 ANESTHESIA PRE-OP EVALUATION  Planned Procedure: EXCISION MASS UPPER EXTREMITY (Right: Arm Lower)  Review of Systems    PONV       patient summary reviewed  nursing notes reviewed        Pulmonary     Cardiovascular    Hypertension, peripheral edema, ECG reviewed and hyperlipidemia , Exercise Tolerance: > or = 4 METS        GI/Hepatic/Renal   negative GI/hepatic/renal ROS,         Endo/Other      type 2 diabetes/ stable    Neuro/Psych/MS    CVA, residual symptoms, anxiety, depression    peripheral neuropathy,  Cancer    negative hematology/oncology ROS,                     Physical Assessment      Airway       Mallampati: II    TM distance: >3 FB    Neck ROM: full  Mouth Opening: good.            Dental           (+) chipped, missing, poor dentition           Pulmonary    Breath sounds clear to auscultation       Cardiovascular    Rhythm: regular  Rate: Normal  (+) peripheral edema present        Other findings              Plan  ASA 3     Planned anesthesia type: general     general anesthesia with laryngeal mask airway      PONV Plan:  I plan to administer pharmcologic prophalaxis antiemetics  POV PLAN:   plan for postoperative opioid use            Intravenous induction     Anesthesia issues/risks discussed are: Sore Throat, Dental Injuries, Nerve Injuries, PONV, Eye /Visual Loss, Aspiration and Cardiac Events/MI.  Anesthetic plan and risks discussed with patient  signed consent obtained          Patient's NPO status is appropriate for Anesthesia.           Plan discussed with CRNA.    (GA with LMA; preoperative ponv prophylaxis; TIVA)

## 2024-11-06 NOTE — Nurses Notes (Signed)
 Patient discharged home with family-daughter Crystal.  AVS reviewed with patient/caregiver.  A written copy of the AVS and discharge instructions was given to the patient/care giver.  Questions sufficiently answered as needed.  Patient/caregiver encouraged to follow up with PCP as indicated.  In the event of an emergency, patient/caregiver instructed to call 911 or go to the nearest emergency room.

## 2024-11-07 DIAGNOSIS — D1721 Benign lipomatous neoplasm of skin and subcutaneous tissue of right arm: Secondary | ICD-10-CM

## 2024-11-07 LAB — SURGICAL PATHOLOGY SPECIMEN

## 2024-11-12 ENCOUNTER — Ambulatory Visit (HOSPITAL_COMMUNITY): Payer: Self-pay | Admitting: GENERAL SURGERY

## 2024-11-15 ENCOUNTER — Ambulatory Visit: Attending: GENERAL SURGERY | Admitting: GENERAL SURGERY

## 2024-11-15 ENCOUNTER — Encounter (HOSPITAL_COMMUNITY): Payer: Self-pay | Admitting: GENERAL SURGERY

## 2024-11-15 ENCOUNTER — Other Ambulatory Visit: Payer: Self-pay

## 2024-11-15 VITALS — BP 114/64 | HR 88 | Temp 97.0°F | Ht 62.0 in | Wt 186.4 lb

## 2024-11-15 DIAGNOSIS — Z09 Encounter for follow-up examination after completed treatment for conditions other than malignant neoplasm: Secondary | ICD-10-CM

## 2024-11-15 DIAGNOSIS — Z86018 Personal history of other benign neoplasm: Secondary | ICD-10-CM

## 2024-11-15 DIAGNOSIS — Z9889 Other specified postprocedural states: Secondary | ICD-10-CM

## 2024-11-15 DIAGNOSIS — D492 Neoplasm of unspecified behavior of bone, soft tissue, and skin: Secondary | ICD-10-CM | POA: Insufficient documentation

## 2024-11-15 NOTE — Progress Notes (Signed)
 Date- 11/15/2024    Pt reports for post op evaluation of right arm lipoma excision.She reports doing well and denies pain or drainage from the wound. Wound is without erythema or edema. Surgical site appears to be healing well. The dermal glue remains intact over the wound. Pull-stitch was removed.    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

## 2024-11-19 ENCOUNTER — Other Ambulatory Visit: Payer: Self-pay

## 2024-11-20 ENCOUNTER — Encounter (HOSPITAL_BASED_OUTPATIENT_CLINIC_OR_DEPARTMENT_OTHER): Payer: Self-pay

## 2024-11-20 ENCOUNTER — Ambulatory Visit: Admission: RE | Admit: 2024-11-20 | Discharge: 2024-11-20 | Disposition: A | Source: Ambulatory Visit

## 2024-11-20 DIAGNOSIS — Z1231 Encounter for screening mammogram for malignant neoplasm of breast: Secondary | ICD-10-CM

## 2024-11-21 DIAGNOSIS — Z1231 Encounter for screening mammogram for malignant neoplasm of breast: Secondary | ICD-10-CM | POA: Insufficient documentation

## 2024-12-26 ENCOUNTER — Encounter (HOSPITAL_BASED_OUTPATIENT_CLINIC_OR_DEPARTMENT_OTHER): Payer: Self-pay | Admitting: Orthopaedic Surgery

## 2024-12-26 ENCOUNTER — Other Ambulatory Visit (INDEPENDENT_AMBULATORY_CARE_PROVIDER_SITE_OTHER): Payer: Self-pay

## 2024-12-26 DIAGNOSIS — E119 Type 2 diabetes mellitus without complications: Secondary | ICD-10-CM

## 2024-12-26 DIAGNOSIS — E785 Hyperlipidemia, unspecified: Secondary | ICD-10-CM

## 2024-12-26 DIAGNOSIS — I10 Essential (primary) hypertension: Secondary | ICD-10-CM

## 2024-12-26 MED ORDER — SIMVASTATIN 20 MG TABLET: 20.0000 mg | ORAL_TABLET | Freq: Every evening | ORAL | 4 refills | Status: AC

## 2024-12-26 MED ORDER — AMLODIPINE 10 MG TABLET: 10.0000 mg | ORAL_TABLET | Freq: Every day | ORAL | 1 refills | Status: AC

## 2024-12-26 MED ORDER — LISINOPRIL 20 MG-HYDROCHLOROTHIAZIDE 12.5 MG TABLET: 1.0000 | ORAL_TABLET | Freq: Two times a day (BID) | ORAL | 1 refills | Status: DC

## 2024-12-26 MED ORDER — SEMAGLUTIDE 0.25 MG OR 0.5 MG (2 MG/3 ML) SUBCUTANEOUS PEN INJECTOR: 0.5000 mg | PEN_INJECTOR | SUBCUTANEOUS | 3 refills | Status: AC

## 2024-12-28 ENCOUNTER — Other Ambulatory Visit (INDEPENDENT_AMBULATORY_CARE_PROVIDER_SITE_OTHER): Payer: Self-pay

## 2024-12-28 DIAGNOSIS — G4700 Insomnia, unspecified: Secondary | ICD-10-CM

## 2025-01-11 ENCOUNTER — Other Ambulatory Visit: Payer: Self-pay

## 2025-01-11 ENCOUNTER — Ambulatory Visit

## 2025-01-11 DIAGNOSIS — I10 Essential (primary) hypertension: Secondary | ICD-10-CM | POA: Insufficient documentation

## 2025-01-11 DIAGNOSIS — E785 Hyperlipidemia, unspecified: Secondary | ICD-10-CM | POA: Insufficient documentation

## 2025-01-11 DIAGNOSIS — E559 Vitamin D deficiency, unspecified: Secondary | ICD-10-CM | POA: Insufficient documentation

## 2025-01-11 LAB — COMPREHENSIVE METABOLIC PNL, FASTING
ALBUMIN: 4.1 g/dL (ref 3.4–4.8)
ALKALINE PHOSPHATASE: 87 U/L (ref 55–145)
ALT (SGPT): 20 U/L (ref <31)
ANION GAP: 11 mmol/L (ref 4–13)
AST (SGOT): 21 U/L (ref 11–34)
BILIRUBIN TOTAL: 0.9 mg/dL (ref 0.3–1.3)
BUN/CREA RATIO: 17 (ref 6–22)
BUN: 12 mg/dL (ref 8–25)
CALCIUM: 9.3 mg/dL (ref 8.6–10.3)
CHLORIDE: 103 mmol/L (ref 96–111)
CO2 TOTAL: 25 mmol/L (ref 23–31)
CREATININE: 0.69 mg/dL (ref 0.60–1.05)
GLUCOSE: 93 mg/dL (ref 70–99)
POTASSIUM: 3.4 mmol/L — ABNORMAL LOW (ref 3.5–5.1)
PROTEIN TOTAL: 7.2 g/dL (ref 5.6–7.6)
SODIUM: 139 mmol/L (ref 136–145)
eGFRcr - FEMALE: >90 mL/min/{1.73_m2} (ref >=60)

## 2025-01-11 LAB — LIPID PANEL
CHOL/HDL RATIO: 3.5
CHOLESTEROL: 166 mg/dL (ref 100–200)
HDL CHOL: 47 mg/dL — ABNORMAL LOW (ref >=50)
LDL CALC: 89 mg/dL (ref <=100)
NON-HDL: 119 mg/dL (ref <=190)
TRIGLYCERIDES: 172 mg/dL — ABNORMAL HIGH (ref <150)
VLDL CALC: 27 mg/dL (ref <30)

## 2025-01-11 LAB — VITAMIN D 25 TOTAL: VITAMIN D 25, TOTAL: 15.9 ng/mL — ABNORMAL LOW (ref 20.0–100.0)

## 2025-01-16 ENCOUNTER — Other Ambulatory Visit: Payer: Self-pay

## 2025-01-17 ENCOUNTER — Encounter (HOSPITAL_COMMUNITY): Payer: Self-pay

## 2025-01-17 ENCOUNTER — Other Ambulatory Visit: Payer: Self-pay

## 2025-01-17 ENCOUNTER — Ambulatory Visit: Payer: Self-pay

## 2025-01-17 ENCOUNTER — Encounter (INDEPENDENT_AMBULATORY_CARE_PROVIDER_SITE_OTHER): Payer: Self-pay

## 2025-01-17 VITALS — BP 93/52 | HR 76 | Temp 97.1°F | Resp 14 | Ht 62.0 in | Wt 184.0 lb

## 2025-01-17 DIAGNOSIS — E1165 Type 2 diabetes mellitus with hyperglycemia: Secondary | ICD-10-CM

## 2025-01-17 DIAGNOSIS — E876 Hypokalemia: Secondary | ICD-10-CM

## 2025-01-17 DIAGNOSIS — G4733 Obstructive sleep apnea (adult) (pediatric): Secondary | ICD-10-CM

## 2025-01-17 DIAGNOSIS — I10 Essential (primary) hypertension: Secondary | ICD-10-CM

## 2025-01-17 DIAGNOSIS — E559 Vitamin D deficiency, unspecified: Secondary | ICD-10-CM

## 2025-01-17 LAB — POCT MICROALBUMIN/CREATININE RATIO, URINE
CREATININE, UR RAND: 200
MICROALBUMIN RANDOM URINE: 10
MICROALBUMIN/CREAT RATIO: <30

## 2025-01-17 LAB — POCT EAST HGB A1C (AMB): POCT HGB A1C: 4.7 % (ref 4–6)

## 2025-01-17 MED ORDER — LISINOPRIL 20 MG TABLET: 20.0000 mg | ORAL_TABLET | Freq: Every day | ORAL | 1 refills | Status: AC

## 2025-01-17 MED ORDER — ERGOCALCIFEROL (VITAMIN D2) 1,250 MCG (50,000 UNIT) CAPSULE: 50000.0000 [IU] | ORAL_CAPSULE | ORAL | 1 refills | Status: AC

## 2025-01-17 NOTE — Nursing Note (Signed)
 01/17/25 1000   Required: Location Test Performed At:   [facility] [place], [facility]) [facility], [address], [place], [place] [address]   Urine Albumin/Creatinine Ratio   Time Performed 1018   Albumin/Creatinine Ratio <30   Albumin/Creatinine Ratio Lot # [id number]   Expiration Date 09/11/25   Internal Control Valid Yes   Microalbumin results: 10   Microalbumin Normal/Abnormal Normal   Performed status: Automated   Initials LB   A1C   Time Performed 1018   A1C 4.7   References Ranges 4 - 6%   A1C Lot # [id number]   Expiration Date 06/18/26   Internal Control Valid yes   Lancet used on  Finger   Initials LB

## 2025-01-17 NOTE — Patient Instructions (Addendum)
 BP medication instructions:  Discontinue Zestoretic   Start lisinopril  20 mg daily  If BP < 120/80, decrease Norvasc  to 5 mg. If still low and symptomatic - lightheaded/unsteady. Discontinue Norvasc  altogether.                                                                    Blood Pressure Record     Systolic  Diastolic     Date Day Time Pressure Pressure Heart Rate                                                                                                                                                                                                                                                                                                                                       Please record your blood pressures and heart rate twice daily for the next two weeks and return them to Amber Williard, PA-C at [facility]

## 2025-01-17 NOTE — Nursing Note (Signed)
 Chief Complaint:   Chief Complaint              Diabetes Follow up A1C    Other Chronic management           Functional Health Screen  Review Flowsheet  More data exists         01/17/2025   FUNCTIONAL HEALTH SCREENING   Have you had a recent unexplained weight loss or gain? N   How often do you have a problem understanding what is told to you about your medical condition?  Never   In the past year, have you been afraid of your partner or ex-partner? No   Do you feel physically safe and emotionally safe where you currently live? Yes   What is your housing situation? Has Housing   Are you worried about losing your housing? No   What is your current work situation? Other unemp.   In the past year, have you or any family members you live with been unable to get any of the following when it was really needed?  No   Has lack of transportation kept you from medical appointments, meetings, work, or from getting things needed for daily living?  No   How often do you see or talk to people that you care about and feel close to? (For example: talking to friends on the phone, visiting friends or family, going to church or club meetings) 3-5x a wk   Are any of these needs urgent? No     BP (!) 93/52 (Site: Left Arm, Patient Position: Sitting, Cuff Size: Adult Large)   Pulse 76   Temp 36.2 C (97.1 F) (Thermal Scan)   Resp 14   Ht 1.575 m (5' 2)   Wt 83.5 kg (184 lb)   LMP  (LMP Unknown)   SpO2 98%   BMI 33.65 kg/m       Tobacco Use History[1]  Patient Health Rating           Depression Screening  PHQ Questionnaire  Little interest or pleasure in doing things.: Not at all  Feeling down, depressed, or hopeless: Not at all  PHQ 2 Total: 0  Trouble falling or staying asleep, or sleeping too much.: Nearly every day (Fall asleep and staying asleep)  Feeling tired or having little energy: Nearly every day  Poor appetite or overeating: Not at all  Feeling bad about yourself/ that you are a failure in the past 2 weeks?: Not at  all  Trouble concentrating on things in the past 2 weeks?: Not at all  Moving/Speaking slowly or being fidgety or restless  in the past 2 weeks?: Not at all  Thoughts that you would be better off DEAD, or of hurting yourself in some way.: Not at all  PHQ 9 Total: 6  Interpretation of Total Score: 5-9 Mild depression  Allergies:  Allergies[2]  Medication History  Reviewed for OTC medication and any new medications, provider will review medication history  Results through Enter/Edit  No results found for this or any previous visit (from the past 24 hours).  POCT Results  Care Team  Patient Care Team:  Sade, Skylar, PA-C as PCP - General (PHYSICIAN ASSISTANT)  Immunizations - last 24 hours       None          Jhonny Kurtz, [place]  01/17/2025, 10:07           [1]   Social History  Tobacco Use  Smoking Status Never   Smokeless Tobacco Never   [2] No Known Allergies

## 2025-01-17 NOTE — Progress Notes (Unsigned)
 FAMILY MEDICINE, [facility]  [address]  [place] [id number]  Operated by [facility]    Encounter Date: 01/17/2025      Patient ID:  Toni Tran   MRN: [id number]    DOB: November 16, 1956   Age: 69 y.o.      Subjective:     Chief Complaint              Diabetes Follow up A1C    Other Chronic management              HPI:  History of Present Illness  Toni Tran is a 69 year old female with hypertension who presents with low blood pressure and sleep issues.    She reports new consistently low systolic blood pressures between 92 and 100 mmHg over the past couple of weeks. She denies swelling, chest pain, or shortness of breath. She takes a combination antihypertensive that includes hydrochlorothiazide .    She has sleep apnea but does not use CPAP because the full face mask feels suffocating. She stopped trazodone  as it did not improve her sleep. She has marked insomnia with very poor sleep and significant daytime fatigue.    Recent labs showed low potassium and low vitamin D . She previously took high-dose vitamin D  but stopped when it ran out and is not taking any vitamin D  now. Triglycerides are elevated at 172 mg/dL and she is not on medication specifically for triglycerides.    Her A1c is 4.7% and kidney function is stable without proteinuria.    Results  Labs  Hemoglobin A1c (01/17/2025): 4.7  Potassium (01/11/2025): Low  Urine protein (01/17/2025): Within normal limits  LDL cholesterol (01/11/2025): Less than 100  Triglycerides (01/11/2025): 172  Vitamin D  (01/11/2025): 15.9    Radiology  Breast mammogram: Normal  Soft tissue ultrasound (10/2024): Findings consistent with lipoma  PMH of hyperlipidemia, hypertension, OSA, CVA, DM type II, vitamin d  deficiency, osteopenia, depression, anxiety, insomnia, and obesity.     Current Outpatient Medications   Medication Sig    amLODIPine  (NORVASC ) 10 mg Oral Tablet Take 1 Tablet (10 mg total) by mouth Daily AMLODIPINE  BESYLATE    Benzonatate   (TESSALON ) 200 mg Oral Capsule Take 1 Capsule (200 mg total) by mouth Three times a day as needed for Cough    lisinopriL -hydrochlorothiazide  (ZESTORETIC ) 20-12.5 mg Oral Tablet Take 1 Tablet by mouth Twice daily    semaglutide  (OZEMPIC ) 0.25 mg or 0.5 mg (2 mg/3 mL) Subcutaneous Pen Injector Inject 0.5 mg under the skin Every 7 days    simvastatin  (ZOCOR ) 20 mg Oral Tablet Take 1 Tablet (20 mg total) by mouth Every evening    traMADoL  (ULTRAM ) 50 mg Oral Tablet Take 1 Tablet (50 mg total) by mouth Every 6 hours as needed for Pain    traZODone  (DESYREL ) 100 mg Oral Tablet TAKE 1 TABLET(100 MG) BY MOUTH EVERY NIGHT       Allergies[1]   Past Medical History:   Diagnosis Date    Anxiety     CVA (cerebrovascular accident)     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  No significant valvular heart disease.    Hyperlipidemia     Hypertension     Mass of arm, right 10/18/2024    MRSA infection 2021    Obesity     Peripheral edema     right foot comes and goes    Peripheral neuropathy     Right foot s/p tarsal tunnel release    Plantar fascial fibromatosis of right foot     PONV (postoperative nausea and vomiting)     requests Anti Emetic    RBBB     RLS (restless legs syndrome)     Sleep apnea     doesn't tolerate cpap I feel like I'm suffocating    Soft tissue tumor excision right forearm 6x3.5x1.5 cm 11-06-2024 11/06/2024    Tarsal tunnel syndrome, right     Type 2 diabetes mellitus     Vitamin D  deficiency     Wears glasses       Past Surgical History:   Procedure Laterality Date    ACHILLES TENDON REPAIR Right 11/15/2018    Achilles tendon  debridement and repair, right    HX APPENDECTOMY  childhood    HX EYE SURGERY Bilateral Childhood    Muscle Surgery    HX FOOT SURGERY Right 06/07/2017    Tarsal Tunnel release; FASCIOTOMY PLANTAR    HX ROTATOR CUFF REPAIR Right 06/2016    with Bone Spur excision    HX TUBAL LIGATION  1992    REPLACEMENT TOTAL KNEE Left 05/06/2020      Family Medical History:       Problem Relation (Age of Onset)    Alzheimer's/Dementia Mother, Father    Arthritis-osteo Mother, Father    Blood Clots Father    Breast Cancer Maternal Aunt, Maternal Aunt    Congestive Heart Failure Father    Coronary Artery Disease Father    Diabetes Father    Heart Attack Mother, Father    High Cholesterol Mother, Father    Hypertension (High Blood Pressure) Mother, Father    Migraines Mother, Father    Sleep disorders Father    Stroke Mother, Father            Social History     Socioeconomic History    Marital status: Single   Tobacco Use    Smoking status: Never    Smokeless tobacco: Never   Vaping Use    Vaping status: Never Used   Substance and Sexual Activity    Alcohol  use: No    Drug use: No     Social Determinants of Health     Financial Resource Strain: Low Risk (01/17/2025)    Financial Resource Strain     SDOH Financial: No   Transportation Needs: Low Risk (01/17/2025)    Transportation Needs     SDOH Transportation: No   Social Connections: Medium Risk (01/17/2025)    Social Connections     SDOH Social Isolation: 3 to 5 times a week   Intimate Partner Violence: Low Risk (01/17/2025)    Intimate Partner Violence     SDOH Domestic Violence: No   Housing Stability: Low Risk (01/17/2025)    Housing Stability     SDOH Housing Situation: I have housing.     SDOH Housing Worry: No   Health Literacy: Low Risk (01/17/2025)    Health Literacy     SDOH Health Literacy: Never   Employment Status: Low  Risk (01/17/2025)    Employment Status     SDOH Employment: Otherwise unemployed but not seeking work (ex. consulting civil engineer, retired, disabled, unpaid primary care giver)       ROS   All pertinent review of systems both positive and negative as per HPI.      Objective:   Vitals: BP (!) 93/52 (Site: Left Arm, Patient Position: Sitting, Cuff Size: Adult Large)   Pulse 76   Temp 36.2 C (97.1 F) (Thermal Scan)   Resp 14   Ht 1.575 m (5' 2)   Wt 83.5 kg (184 lb)   LMP  (LMP Unknown)   SpO2 98%   BMI 33.65 kg/m         Physical Exam   Physical Exam  GENERAL: Alert, cooperative, well developed, no acute distress.  HEENT: Normocephalic, normal oropharynx, moist mucous membranes.  CHEST: Clear to auscultation bilaterally, no wheezes, rhonchi, or crackles.  CARDIOVASCULAR: Normal heart rate and rhythm, S1 and S2 normal without murmurs.  ABDOMEN: Soft, non-tender, non-distended, without organomegaly, normal bowel sounds.  EXTREMITIES: No cyanosis or edema.  NEUROLOGICAL: Cranial nerves grossly intact, moves all extremities without gross motor or sensory deficit.    Assessment & Plan:     ENCOUNTER DIAGNOSES     ICD-10-CM   1. Type 2 diabetes mellitus with hyperglycemia  E11.65      Orders Placed This Encounter    POCT EAST HGB A1C (AMB)    POCT Urine Microalb/Cr Ratio      This is a 69 y.o. female here with     Assessment & Plan    Diabetes type II/hyperlipidemia   -A1C 5.0 in office; previous was 5.1  -Continue Ozempic  0.5 mg subcutaneous once q.7 days   -Continue Zocor  20 mg p.o. q.pm  -Microalbumin urine WNL.     Hypertension, controlled  -Continue Norvasc  10 mg p.o. q.d - monitor BP closely and adjust 5 mg p.o. q.d if BP in the low 100's systolic. If persistently low and not feeling well, D/C Norvasc  altogether   -Continue Zestoretic  20-12.5 mg p.o. b.i.d - D/C and start lisinopril  20 mg p.o. q.d   -CMP     OSA  -Not currently wearing CPAP  -Previously referred to pulmonology - readdress at next appt. Suspect sleep issues due to OSA     Hx of CVA  -Emphasize importance of control of HTN and DM  -Continue Aspirin  81 mg p.o. q.d   -Continue Norvasc  10 mg p.o. q.d   -Continue  Zestoretic  20-12.5 mg p.o. b.i.d  -Continue Zocor  20 mg p.o q.pm      Vitamin D  deficiency/osteopenia   -Continue high dose vitamin D  50,000 units p.o. once weekly   -Repeat vitamin D  level in 3 months      Depression/anxiety/insomnia   -Increase Trazodone  to 100 mg p.o. q.pm - not taking   -Readdress OSA/CPAP     Obesity  -Lifestyle modifications/Ozempic  as above      Soft tissue nodule, right forearm  -US  soft tissue  -Refer to general surgery          Hypertension  Blood pressure low due to antihypertensive regimen. Potassium low, likely from hydrochlorothiazide  in Zestoretic . No heart failure or swelling, diuretic unnecessary.  - Discontinued Zestoretic .  - Prescribed lisinopril  20 mg daily.  - Monitor blood pressure at home.  - Reduce amlodipine  to 5 mg if BP <120/80 mmHg.  - Discontinue amlodipine  if BP low and symptomatic.  - Maintain BP log for follow-up.  Type 2 diabetes mellitus  A1c excellent at 4.7%. No significant proteinuria, indicating good kidney function.  - Continue current diabetes management.  - Ensure diabetic eye exam on February 17th.    Obstructive sleep apnea  Sleep apnea untreated due to CPAP discomfort. Increases cardiovascular and stroke risk.  - Referred to sleep specialist and pulmonologist for evaluation and alternative CPAP masks.    Hypokalemia  Potassium slightly low, likely from hydrochlorothiazide  in Zestoretic .  - Discontinued Zestoretic .  - Ordered repeat basic metabolic panel to recheck potassium levels.    Vitamin D  deficiency  Vitamin D  level low at 15.9.  - Prescribed high-dose vitamin D  weekly.  - Advised 1000 IU OTC vitamin D  daily.  - Periodically check vitamin D  levels.    General Health Maintenance  Mammogram normal.  - Continue annual mammogram screenings.  - Ensure follow-up appointments as needed.                    Marijo Danes, PA-C        Attestation    This note was created using [employer] mobile application via conversational audio. Consent for audio recording  was obtained by patient/family members prior to recording.             [1] No Known Allergies

## 2025-02-26 ENCOUNTER — Ambulatory Visit (INDEPENDENT_AMBULATORY_CARE_PROVIDER_SITE_OTHER): Payer: Self-pay
